# Patient Record
Sex: Female | Born: 1950 | Race: White | Hispanic: No | Marital: Married | Smoking: Current every day smoker
Health system: Southern US, Community
[De-identification: ages and names within clinical notes are randomized; demographics above are authoritative.]

## PROBLEM LIST (undated history)

## (undated) DIAGNOSIS — I4891 Unspecified atrial fibrillation: Secondary | ICD-10-CM

## (undated) DIAGNOSIS — I4892 Unspecified atrial flutter: Secondary | ICD-10-CM

## (undated) DIAGNOSIS — M1990 Unspecified osteoarthritis, unspecified site: Secondary | ICD-10-CM

## (undated) DIAGNOSIS — T4145XA Adverse effect of unspecified anesthetic, initial encounter: Secondary | ICD-10-CM

## (undated) DIAGNOSIS — K219 Gastro-esophageal reflux disease without esophagitis: Secondary | ICD-10-CM

## (undated) DIAGNOSIS — I10 Essential (primary) hypertension: Secondary | ICD-10-CM

## (undated) DIAGNOSIS — M549 Dorsalgia, unspecified: Secondary | ICD-10-CM

## (undated) DIAGNOSIS — T8859XA Other complications of anesthesia, initial encounter: Secondary | ICD-10-CM

## (undated) DIAGNOSIS — R112 Nausea with vomiting, unspecified: Secondary | ICD-10-CM

## (undated) DIAGNOSIS — Z9889 Other specified postprocedural states: Secondary | ICD-10-CM

## (undated) DIAGNOSIS — R0600 Dyspnea, unspecified: Secondary | ICD-10-CM

## (undated) DIAGNOSIS — J449 Chronic obstructive pulmonary disease, unspecified: Secondary | ICD-10-CM

## (undated) DIAGNOSIS — D369 Benign neoplasm, unspecified site: Secondary | ICD-10-CM

## (undated) DIAGNOSIS — E785 Hyperlipidemia, unspecified: Secondary | ICD-10-CM

## (undated) DIAGNOSIS — D509 Iron deficiency anemia, unspecified: Secondary | ICD-10-CM

## (undated) HISTORY — DX: Unspecified osteoarthritis, unspecified site: M19.90

## (undated) HISTORY — DX: Unspecified atrial fibrillation: I48.91

## (undated) HISTORY — DX: Iron deficiency anemia, unspecified: D50.9

## (undated) HISTORY — DX: Hyperlipidemia, unspecified: E78.5

## (undated) HISTORY — DX: Gastro-esophageal reflux disease without esophagitis: K21.9

## (undated) HISTORY — PX: MULTIPLE TOOTH EXTRACTIONS: SHX2053

## (undated) HISTORY — PX: JOINT REPLACEMENT: SHX530

## (undated) HISTORY — DX: Benign neoplasm, unspecified site: D36.9

## (undated) HISTORY — DX: Unspecified atrial flutter: I48.92

## (undated) HISTORY — DX: Dorsalgia, unspecified: M54.9

---

## 2002-03-14 DIAGNOSIS — D369 Benign neoplasm, unspecified site: Secondary | ICD-10-CM

## 2002-03-14 HISTORY — DX: Benign neoplasm, unspecified site: D36.9

## 2003-03-15 LAB — CONVERTED CEMR LAB

## 2004-04-30 ENCOUNTER — Inpatient Hospital Stay (HOSPITAL_COMMUNITY): Admission: RE | Admit: 2004-04-30 | Discharge: 2004-05-04 | Payer: Self-pay | Admitting: Specialist

## 2005-05-12 HISTORY — PX: OTHER SURGICAL HISTORY: SHX169

## 2005-05-27 ENCOUNTER — Inpatient Hospital Stay (HOSPITAL_COMMUNITY): Admission: RE | Admit: 2005-05-27 | Discharge: 2005-06-01 | Payer: Self-pay | Admitting: Specialist

## 2005-06-13 ENCOUNTER — Ambulatory Visit (HOSPITAL_COMMUNITY): Admission: RE | Admit: 2005-06-13 | Discharge: 2005-06-13 | Payer: Self-pay | Admitting: Specialist

## 2005-06-13 ENCOUNTER — Encounter: Payer: Self-pay | Admitting: Vascular Surgery

## 2005-07-11 ENCOUNTER — Encounter: Admission: RE | Admit: 2005-07-11 | Discharge: 2005-07-11 | Payer: Self-pay | Admitting: Specialist

## 2006-05-19 ENCOUNTER — Other Ambulatory Visit: Admission: RE | Admit: 2006-05-19 | Discharge: 2006-05-19 | Payer: Self-pay | Admitting: Family Medicine

## 2006-05-19 ENCOUNTER — Ambulatory Visit: Payer: Self-pay | Admitting: Family Medicine

## 2006-05-19 ENCOUNTER — Encounter: Payer: Self-pay | Admitting: Family Medicine

## 2006-05-19 DIAGNOSIS — R1011 Right upper quadrant pain: Secondary | ICD-10-CM | POA: Insufficient documentation

## 2006-05-19 LAB — CONVERTED CEMR LAB: Pap Smear: NORMAL

## 2006-05-22 ENCOUNTER — Encounter: Payer: Self-pay | Admitting: Family Medicine

## 2006-05-23 ENCOUNTER — Encounter: Payer: Self-pay | Admitting: Family Medicine

## 2006-05-23 DIAGNOSIS — E785 Hyperlipidemia, unspecified: Secondary | ICD-10-CM | POA: Insufficient documentation

## 2006-05-23 LAB — CONVERTED CEMR LAB
AST: 24 units/L (ref 0–37)
Alkaline Phosphatase: 80 units/L (ref 39–117)
Glucose, Bld: 106 mg/dL — ABNORMAL HIGH (ref 70–99)
LDL Cholesterol: 194 mg/dL — ABNORMAL HIGH (ref 0–99)
Sodium: 141 meq/L (ref 135–145)
Total Bilirubin: 0.4 mg/dL (ref 0.3–1.2)
Total Protein: 7 g/dL (ref 6.0–8.3)
Triglycerides: 237 mg/dL — ABNORMAL HIGH (ref <150)
VLDL: 47 mg/dL — ABNORMAL HIGH (ref 0–40)

## 2006-05-24 ENCOUNTER — Encounter: Payer: Self-pay | Admitting: Family Medicine

## 2006-05-25 ENCOUNTER — Telehealth (INDEPENDENT_AMBULATORY_CARE_PROVIDER_SITE_OTHER): Payer: Self-pay | Admitting: *Deleted

## 2006-06-28 ENCOUNTER — Ambulatory Visit: Payer: Self-pay | Admitting: Family Medicine

## 2006-06-28 ENCOUNTER — Encounter: Payer: Self-pay | Admitting: Family Medicine

## 2006-07-10 ENCOUNTER — Encounter: Payer: Self-pay | Admitting: Family Medicine

## 2006-07-25 ENCOUNTER — Ambulatory Visit: Payer: Self-pay | Admitting: Gastroenterology

## 2006-07-27 ENCOUNTER — Telehealth: Payer: Self-pay | Admitting: Family Medicine

## 2006-07-31 ENCOUNTER — Ambulatory Visit: Payer: Self-pay | Admitting: Gastroenterology

## 2006-07-31 DIAGNOSIS — K2100 Gastro-esophageal reflux disease with esophagitis, without bleeding: Secondary | ICD-10-CM | POA: Insufficient documentation

## 2006-08-04 ENCOUNTER — Ambulatory Visit: Payer: Self-pay | Admitting: Cardiovascular Disease

## 2006-08-21 ENCOUNTER — Ambulatory Visit: Payer: Self-pay | Admitting: Gastroenterology

## 2006-08-25 ENCOUNTER — Encounter: Payer: Self-pay | Admitting: Family Medicine

## 2006-08-25 DIAGNOSIS — K219 Gastro-esophageal reflux disease without esophagitis: Secondary | ICD-10-CM | POA: Insufficient documentation

## 2006-10-25 ENCOUNTER — Ambulatory Visit: Payer: Self-pay | Admitting: Gastroenterology

## 2006-10-25 ENCOUNTER — Encounter: Payer: Self-pay | Admitting: Family Medicine

## 2006-12-29 ENCOUNTER — Encounter: Admission: RE | Admit: 2006-12-29 | Discharge: 2006-12-29 | Payer: Self-pay | Admitting: Family Medicine

## 2007-04-03 ENCOUNTER — Ambulatory Visit: Payer: Self-pay | Admitting: Gastroenterology

## 2007-04-03 ENCOUNTER — Encounter: Payer: Self-pay | Admitting: Family Medicine

## 2007-04-05 ENCOUNTER — Encounter: Admission: RE | Admit: 2007-04-05 | Discharge: 2007-04-05 | Payer: Self-pay | Admitting: Gastroenterology

## 2007-04-27 ENCOUNTER — Ambulatory Visit: Payer: Self-pay | Admitting: Gastroenterology

## 2007-04-27 ENCOUNTER — Encounter: Payer: Self-pay | Admitting: Family Medicine

## 2007-04-27 ENCOUNTER — Encounter: Payer: Self-pay | Admitting: Gastroenterology

## 2007-04-27 DIAGNOSIS — D126 Benign neoplasm of colon, unspecified: Secondary | ICD-10-CM | POA: Insufficient documentation

## 2007-04-27 DIAGNOSIS — K648 Other hemorrhoids: Secondary | ICD-10-CM | POA: Insufficient documentation

## 2007-05-01 ENCOUNTER — Ambulatory Visit: Payer: Self-pay | Admitting: Family Medicine

## 2007-05-08 HISTORY — PX: CHOLECYSTECTOMY: SHX55

## 2007-09-05 ENCOUNTER — Ambulatory Visit: Payer: Self-pay | Admitting: Family Medicine

## 2007-09-06 ENCOUNTER — Encounter: Payer: Self-pay | Admitting: Family Medicine

## 2007-10-08 ENCOUNTER — Ambulatory Visit: Payer: Self-pay | Admitting: Family Medicine

## 2007-10-09 LAB — CONVERTED CEMR LAB
ALT: 36 units/L — ABNORMAL HIGH (ref 0–35)
Albumin: 4.7 g/dL (ref 3.5–5.2)
Basophils Absolute: 0 10*3/uL (ref 0.0–0.1)
CO2: 20 meq/L (ref 19–32)
Calcium: 9.7 mg/dL (ref 8.4–10.5)
Chloride: 106 meq/L (ref 96–112)
Hemoglobin: 16 g/dL — ABNORMAL HIGH (ref 12.0–15.0)
Lymphocytes Relative: 27 % (ref 12–46)
Monocytes Absolute: 0.6 10*3/uL (ref 0.1–1.0)
Neutro Abs: 5.6 10*3/uL (ref 1.7–7.7)
Platelets: 218 10*3/uL (ref 150–400)
RDW: 13.3 % (ref 11.5–15.5)
Sodium: 141 meq/L (ref 135–145)
Total Protein: 6.9 g/dL (ref 6.0–8.3)
WBC: 8.7 10*3/uL (ref 4.0–10.5)

## 2007-12-31 ENCOUNTER — Encounter: Payer: Self-pay | Admitting: Family Medicine

## 2008-01-02 ENCOUNTER — Ambulatory Visit: Payer: Self-pay | Admitting: Family Medicine

## 2008-01-02 ENCOUNTER — Other Ambulatory Visit: Admission: RE | Admit: 2008-01-02 | Discharge: 2008-01-02 | Payer: Self-pay | Admitting: Family Medicine

## 2008-01-02 ENCOUNTER — Encounter: Payer: Self-pay | Admitting: Family Medicine

## 2008-01-03 ENCOUNTER — Telehealth (INDEPENDENT_AMBULATORY_CARE_PROVIDER_SITE_OTHER): Payer: Self-pay | Admitting: *Deleted

## 2008-01-03 DIAGNOSIS — N281 Cyst of kidney, acquired: Secondary | ICD-10-CM | POA: Insufficient documentation

## 2008-01-03 LAB — CONVERTED CEMR LAB
ALT: 46 units/L — ABNORMAL HIGH (ref 0–35)
Alkaline Phosphatase: 75 units/L (ref 39–117)
LDL Cholesterol: 193 mg/dL — ABNORMAL HIGH (ref 0–99)
Sodium: 141 meq/L (ref 135–145)
Total Bilirubin: 1 mg/dL (ref 0.3–1.2)
Total Protein: 6.8 g/dL (ref 6.0–8.3)
Triglycerides: 150 mg/dL — ABNORMAL HIGH (ref <150)
VLDL: 30 mg/dL (ref 0–40)

## 2008-01-03 LAB — HM PAP SMEAR

## 2008-01-07 ENCOUNTER — Ambulatory Visit: Payer: Self-pay | Admitting: Family Medicine

## 2008-01-09 ENCOUNTER — Encounter: Admission: RE | Admit: 2008-01-09 | Discharge: 2008-01-09 | Payer: Self-pay | Admitting: Family Medicine

## 2008-01-14 ENCOUNTER — Telehealth: Payer: Self-pay | Admitting: Family Medicine

## 2008-09-01 ENCOUNTER — Ambulatory Visit: Payer: Self-pay | Admitting: Family Medicine

## 2008-09-01 DIAGNOSIS — M545 Low back pain, unspecified: Secondary | ICD-10-CM | POA: Insufficient documentation

## 2008-11-06 ENCOUNTER — Ambulatory Visit: Payer: Self-pay | Admitting: Family Medicine

## 2008-11-06 LAB — CONVERTED CEMR LAB
Blood in Urine, dipstick: NEGATIVE
Glucose, Urine, Semiquant: NEGATIVE
Nitrite: NEGATIVE
Urobilinogen, UA: 0.2

## 2008-11-07 ENCOUNTER — Encounter: Payer: Self-pay | Admitting: Family Medicine

## 2009-01-08 ENCOUNTER — Telehealth: Payer: Self-pay | Admitting: Family Medicine

## 2009-01-13 ENCOUNTER — Encounter: Payer: Self-pay | Admitting: Family Medicine

## 2009-04-16 ENCOUNTER — Ambulatory Visit: Payer: Self-pay | Admitting: Family Medicine

## 2009-04-16 ENCOUNTER — Telehealth: Payer: Self-pay | Admitting: Family Medicine

## 2009-04-27 ENCOUNTER — Encounter: Admission: RE | Admit: 2009-04-27 | Discharge: 2009-04-27 | Payer: Self-pay | Admitting: Family Medicine

## 2009-04-27 ENCOUNTER — Encounter: Payer: Self-pay | Admitting: Family Medicine

## 2009-04-30 LAB — CONVERTED CEMR LAB
AST: 14 units/L (ref 0–37)
Albumin: 4.5 g/dL (ref 3.5–5.2)
Alkaline Phosphatase: 74 units/L (ref 39–117)
BUN: 13 mg/dL (ref 6–23)
HDL: 50 mg/dL (ref >39)
LDL Cholesterol: 127 mg/dL — ABNORMAL HIGH (ref 0–99)
Potassium: 4.8 meq/L (ref 3.5–5.3)
Total Bilirubin: 0.5 mg/dL (ref 0.3–1.2)
Total CHOL/HDL Ratio: 3.8
VLDL: 14 mg/dL (ref 0–40)

## 2009-05-10 ENCOUNTER — Encounter: Admission: RE | Admit: 2009-05-10 | Discharge: 2009-05-10 | Payer: Self-pay | Admitting: Family Medicine

## 2009-06-02 ENCOUNTER — Telehealth: Payer: Self-pay | Admitting: Family Medicine

## 2009-10-23 ENCOUNTER — Ambulatory Visit: Payer: Self-pay | Admitting: Family Medicine

## 2009-10-23 DIAGNOSIS — IMO0002 Reserved for concepts with insufficient information to code with codable children: Secondary | ICD-10-CM | POA: Insufficient documentation

## 2010-03-02 ENCOUNTER — Encounter: Payer: Self-pay | Admitting: Family Medicine

## 2010-03-03 ENCOUNTER — Encounter: Payer: Self-pay | Admitting: Family Medicine

## 2010-04-04 ENCOUNTER — Encounter: Payer: Self-pay | Admitting: Specialist

## 2010-04-13 NOTE — Assessment & Plan Note (Signed)
Summary: Trap strain, LIpids   Vital Signs:  Patient profile:   60 year old female Height:      64 inches Weight:      252 pounds BMI:     43.41 O2 Sat:      96 % on Room air Temp:     98.3 degrees F oral Pulse rate:   88 / minute BP sitting:   133 / 83  (right arm)  Vitals Entered By: Selena Batten Johnson/April (April 16, 2009 2:53 PM)  O2 Flow:  Room air CC: c/o right shoulder pain x 2 days, last night pain 8/10, took aleve no relief   Primary Care Provider:  Linford Arnold, C  CC:  c/o right shoulder pain x 2 days, last night pain 8/10, and took aleve no relief.  History of Present Illness: c/o right shoulder pain x 2 days, last night pain 8/10, took aleve no relief.  No trauma or injury.  Righ scap pain betweent eh shoulder blade and the spine. Feels like something  is "pinched".  Taking ALve and no relief.  Tried heat - eases  alittle. Normal shoulder ROM.  No worsening positions or sxs.  Cough makes it worse.    Current Medications (verified): 1)  Multivitamins  Caps (Multiple Vitamin) .... Take One Tablet Once A Day 2)  Coral Calcium 500 Mg Caps (Coral Calcium) .... Take One Capsule By Mouth Once A Day 3)  Acidophilus  Caps (Lactobacillus) 4)  Simvastatin 40 Mg Tabs (Simvastatin) .... Take 1 Tablet By Mouth Once A Day At Bedtime 5)  Bactrim Ds 800-160 Mg Tabs (Sulfamethoxazole-Trimethoprim) .... Take One (1) By Mouth Twice A Day For 3 Days  Allergies (verified): No Known Drug Allergies  Social History: Reviewed history from 05/19/2006 and no changes required. 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   Current Smoker Alcohol use-yes Drug use-no Regular exercise-no  Physical Exam  General:  Well-developed,well-nourished,in no acute distress; alert,appropriate and cooperative throughout examination Msk:  No deformity or scoliosis noted of thoracic or lumbar spine.  Neck and shoulder with NROM.  Pain with rotation of the neck to the  right.  non-tender cervical and upper thoracid spine.  Tender betweent eh scapular edge and the spine. Nontender scapula and shoulder.  Shoudler strnethg 5/5    Impression & Recommendations:  Problem # 1:  MUSCLE SPASM, TRAPEZIUS (ICD-728.85) Continue NSAID use. Add muscle relaxer at betime. Given H.O on exercises to stretch out the area (upper backi). If not improving in 1-2 weeks then please come back for furthe eval and possible trigger point injection.   Problem # 2:  HYPERLIPIDEMIA (ICD-272.4) Due for refill and recheck on labs. Will check liver function as well.  Her updated medication list for this problem includes:    Simvastatin 40 Mg Tabs (Simvastatin) .Marland Kitchen... Take 1 tablet by mouth once a day at bedtime  Orders: T-Lipid Profile ([id number]) T-Comprehensive Metabolic Panel ([id number])  Complete Medication List: 1)  Multivitamins Caps (Multiple vitamin) .... Take one tablet once a day 2)  Coral Calcium 500 Mg Caps (Coral calcium) .... Take one capsule by mouth once a day 3)  Acidophilus Caps (Lactobacillus) 4)  Simvastatin 40 Mg Tabs (Simvastatin) .... Take 1 tablet by mouth once a day at bedtime 5)  Flexeril 10 Mg Tabs (Cyclobenzaprine hcl) .... Up to 3 x a day as needed for muscle spasm. 6)  Piroxicam 10 Mg Caps (Piroxicam) .... Take 1 tablet  by mouth two times a day as needed for arthritis Prescriptions: PIROXICAM 10 MG CAPS (PIROXICAM) Take 1 tablet by mouth two times a day as needed for arthritis  #60 x 2   Entered and Authorized by:   Nani Gasser MD   Signed by:   Nani Gasser MD on 04/16/2009   Method used:   [facility]        [facility] [phone]* (retail)       [address] [place], [place]  [address]       Ph: [phone] or [phone]       Fax: [phone]   RxID:   [id number] SIMVASTATIN 40 MG TABS (SIMVASTATIN) Take 1 tablet by mouth once a day at bedtime  #90 x 3   Entered and Authorized by:   Nani Gasser MD    Signed by:   Nani Gasser MD on 04/16/2009   Method used:   [facility] to        [facility] [phone]* (retail)       [address] [place], [place]  [address]       Ph: [phone] or [phone]       Fax: [phone]   RxID:   [id number] FLEXERIL 10 MG TABS (CYCLOBENZAPRINE HCL) up to 3 x a day as needed for muscle spasm.  #20 x 0   Entered and Authorized by:   Nani Gasser MD   Signed by:   Nani Gasser MD on 04/16/2009   Method used:   [facility]ronically to        [facility] [phone]* (retail)       [address] [place], [place]  [address]       Ph: [phone] or [phone]       Fax: [phone]   RxID:   [id number]

## 2010-04-13 NOTE — Assessment & Plan Note (Signed)
Summary: BACK PAIN, acute   Vital Signs:  Patient profile:   60 year old female Height:      64 inches Weight:      257 pounds Pulse rate:   66 / minute BP sitting:   156 / 84  (left arm) Cuff size:   regular  Vitals Entered By: Avon Gully CMA, Duncan Dull) (October 23, 2009 10:02 AM) CC: back pain since 3am  Pain Assessment Patient in pain? yes     Location: back Intensity: 8 Type: sharp   Primary Care Jamespaul Secrist:  Linford Arnold, C  CC:  back pain since 3am .  History of Present Illness: Last Wednsday felt something puss as she ws going to the bathroom. then about 3AM got out of back andfelt her back "catch" on her right mid back.  Only taking ASA for pain relief. No radiation of pain.  Pain is 8/10 today.  Better if leans forward. Worse when striaghtens up. No prior injuries.   Current Medications (verified): 1)  Multivitamins  Caps (Multiple Vitamin) .... Take One Tablet Once A Day 2)  Coral Calcium 500 Mg Caps (Coral Calcium) .... Take One Capsule By Mouth Once A Day 3)  Simvastatin 80 Mg Tabs (Simvastatin) .... Take 1 Tablet By Mouth Once A Day At Bedtime 4)  Piroxicam 10 Mg Caps (Piroxicam) .... Take 1 Tablet By Mouth Two Times A Day As Needed For Arthritis 5)  Fish Oil 1000 Mg Caps (Omega-3 Fatty Acids) .... One Tablet By Mouth Once A Day 6)  Vitamin D3 2000 Unit Tabs (Cholecalciferol) .... One Tablet By Mouth Once A Day  Allergies (verified): No Known Drug Allergies  Comments:  Nurse/Medical Assistant: The patient's medications and allergies were reviewed with the patient and were updated in the Medication and Allergy Lists. Avon Gully CMA, Duncan Dull) (October 23, 2009 10:05 AM)  Physical Exam  General:  Well-developed,well-nourished,in no acute distress; alert,appropriate and cooperative throughout examination Head:  Normocephalic and atraumatic without obvious abnormalities. No apparent alopecia or balding. Msk:  Normal flexion, dec extension to about 10 degress.  Normal rotation right and left but very painful.  Tender over the lower thoraic right paraspinous muscles. Neg straight leg raise. Hip, knee, ankle sterngh 5/5. Nontender over the thoracid orlumbar spine.    Impression & Recommendations:  Problem # 1:  BACK STRAIN, ACUTE (ICD-847.9)  Start Aleve two times a day with food Start flexeril 10mg  at bedtime as needed for muscle spasm.  Can use tramadol for extra relief Can start exercises tomorrow If not better in 2 weeks then follow up.  Toradol given for acute pain relief.   Orders: Ketorolac-Toradol 15mg  (Z6109) Admin of Therapeutic Inj  intramuscular or subcutaneous (60[address])  Complete Medication List: 1)  Multivitamins Caps (Multiple vitamin) .... Take one tablet once a day 2)  Coral Calcium 500 Mg Caps (Coral calcium) .... Take one capsule by mouth once a day 3)  Simvastatin 80 Mg Tabs (Simvastatin) .... Take 1 tablet by mouth once a day at bedtime 4)  Piroxicam 10 Mg Caps (Piroxicam) .... Take 1 tablet by mouth two times a day as needed for arthritis 5)  Fish Oil 1000 Mg Caps (Omega-3 fatty acids) .... One tablet by mouth once a day 6)  Vitamin D3 2000 Unit Tabs (Cholecalciferol) .... One tablet by mouth once a day 7)  Flexeril 10 Mg Tabs (Cyclobenzaprine hcl) .... Take 1 tablet by mouth two times a day as needed 8)  Tramadol Hcl 50 Mg Tabs (Tramadol  hcl) .... Take 1 tablet by mouth three times a day as needed pain  Patient Instructions: 1)  Start Aleve two times a day with food 2)  Start flexeril 10mg  at bedtime as needed for muscle spasm.  3)  Can use tramadol for extra relief 4)  Can start exercises tomorrow 5)  If not better in 2 weeks then follow up.  Prescriptions: TRAMADOL HCL 50 MG TABS (TRAMADOL HCL) Take 1 tablet by mouth three times a day as needed pain  #45 x 0   Entered and Authorized by:   Nani Gasser MD   Signed by:   Nani Gasser MD on 10/23/2009   Method used:   [facility]ronically to        [facility] [phone]* (retail)       [address] [place], [place]  [address]       Ph: [phone] or [phone]       Fax: [phone]   RxID:   [id number] FLEXERIL 10 MG TABS (CYCLOBENZAPRINE HCL) Take 1 tablet by mouth two times a day as needed  #40 x 0   Entered and Authorized by:   Nani Gasser MD   Signed by:   Nani Gasser MD on 10/23/2009   Method used:   [facility]ronically to        [facility] [phone]* (retail)       [address] [place], [place]  [address]       Ph: [phone] or [phone]       Fax: [phone]   RxID:   [id number]    Medication Administration  Injection # 1:    Medication: Ketorolac-Toradol 15mg     Diagnosis: BACK STRAIN, ACUTE (ICD-847.9)    Route: IM    Site: RUOQ gluteus    Exp Date: 02/12/2011    Lot #: [id number]    Mfr: hospira    Comments: toradol 60mg /ml given    Patient tolerated injection without complications    Given by: Avon Gully CMA, Duncan Dull) (October 23, 2009 10:19 AM)  Orders Added: 1)  Est. Patient Level IV [99214] 2)  Ketorolac-Toradol 15mg  [J1885] 3)  Admin of Therapeutic Inj  intramuscular or subcutaneous [02774]

## 2010-04-13 NOTE — Progress Notes (Signed)
Summary: WANTS REFERRAL TO [facility]  Phone Note Call from Patient   Caller: Patient Summary of Call: Dr.Azaiah Licciardi  Patient nned a Referral to go to [facility] Initial call taken by: Vanessa Swaziland,  April 16, 2009 3:31 PM  Follow-up for Phone Call        [facility] for Pt to [facility] w/ more info re request. 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 would like a [facility] Additional Follow-up by: Kandice Hams,  April 21, 2009 4:42 PM    Additional Follow-up for Phone Call Additional follow up Details #2::    Was already ordered in October. Will reprint and fax downstairs.   Follow-up by: Nani Gasser MD,  April 21, 2009 5:05 PM

## 2010-04-13 NOTE — Progress Notes (Signed)
Summary: needs rx  Phone Note Call from Patient Call back at Home Phone [phone]   Caller: Patient Call For: Nani Gasser MD Summary of Call: Pt calls and needs new Rx for the cholesterol med that you increased the dose on sent to her pharmacy. Increased to 80mg .   Initial call taken by: Kathlene November,  June 02, 2009 11:29 AM    New/Updated Medications: SIMVASTATIN 80 MG TABS (SIMVASTATIN) Take 1 tablet by mouth once a day at bedtime Prescriptions: SIMVASTATIN 80 MG TABS (SIMVASTATIN) Take 1 tablet by mouth once a day at bedtime  #30 x 3   Entered and Authorized by:   Nani Gasser MD   Signed by:   Nani Gasser MD on 06/02/2009   Method used:   Electronically to        [facility] [phone]* (retail)       [address] [place], [place]  [address]       Ph: [phone]       Fax: [phone]   RxID:   [id number]

## 2010-04-13 NOTE — Letter (Signed)
Summary: Out of Work  [facility]  [address]   [place], [place] [address]   Phone: [phone]  Fax: [phone]    October 23, 2009   Employee:  Steele Sizer    To Whom It May Concern:   For Medical reasons, please excuse the above named employee from work for the following dates:  Start:   10-23-2009  End:   10-26-2009  If you need additional information, please feel free to contact our office.         Sincerely,    Nani Gasser MD

## 2010-05-11 ENCOUNTER — Encounter: Payer: Self-pay | Admitting: Family Medicine

## 2010-05-11 ENCOUNTER — Other Ambulatory Visit: Payer: Self-pay | Admitting: Family Medicine

## 2010-05-11 ENCOUNTER — Encounter (INDEPENDENT_AMBULATORY_CARE_PROVIDER_SITE_OTHER): Payer: Managed Care, Other (non HMO) | Admitting: Family Medicine

## 2010-05-11 DIAGNOSIS — R351 Nocturia: Secondary | ICD-10-CM

## 2010-05-11 DIAGNOSIS — Z01419 Encounter for gynecological examination (general) (routine) without abnormal findings: Secondary | ICD-10-CM

## 2010-05-11 DIAGNOSIS — F39 Unspecified mood [affective] disorder: Secondary | ICD-10-CM | POA: Insufficient documentation

## 2010-05-11 DIAGNOSIS — R0989 Other specified symptoms and signs involving the circulatory and respiratory systems: Secondary | ICD-10-CM | POA: Insufficient documentation

## 2010-05-11 DIAGNOSIS — I10 Essential (primary) hypertension: Secondary | ICD-10-CM

## 2010-05-11 DIAGNOSIS — R0609 Other forms of dyspnea: Secondary | ICD-10-CM

## 2010-05-11 LAB — CONVERTED CEMR LAB
Cholesterol, target level: 200 mg/dL
HDL goal, serum: 40 mg/dL
LDL Goal: 130 mg/dL

## 2010-05-13 ENCOUNTER — Encounter: Payer: Self-pay | Admitting: Family Medicine

## 2010-05-14 ENCOUNTER — Encounter (INDEPENDENT_AMBULATORY_CARE_PROVIDER_SITE_OTHER): Payer: Self-pay | Admitting: *Deleted

## 2010-05-17 ENCOUNTER — Encounter: Payer: Self-pay | Admitting: Family Medicine

## 2010-05-18 LAB — CONVERTED CEMR LAB
ALT: 29 units/L (ref 0–35)
AST: 19 units/L (ref 0–37)
Alkaline Phosphatase: 77 units/L (ref 39–117)
BUN: 12 mg/dL (ref 6–23)
Blood, UA: NEGATIVE
Calcium: 9 mg/dL (ref 8.4–10.5)
Chloride: 105 meq/L (ref 96–112)
Creatinine, Ser: 0.65 mg/dL (ref 0.40–1.20)
HDL: 51 mg/dL (ref >39)
LDL Cholesterol: 149 mg/dL — ABNORMAL HIGH (ref 0–99)
Leukocytes, UA: NEGATIVE
Nitrite: NEGATIVE
Potassium: 4.4 meq/L (ref 3.5–5.3)
Protein, ur: NEGATIVE mg/dL
Squamous Epithelial / LPF: NONE SEEN /lpf
Total CHOL/HDL Ratio: 4.3
Urine Glucose: NEGATIVE mg/dL
Urobilinogen, UA: 0.2 (ref 0.0–1.0)

## 2010-05-20 ENCOUNTER — Encounter: Payer: Self-pay | Admitting: Family Medicine

## 2010-05-20 ENCOUNTER — Other Ambulatory Visit: Payer: Self-pay | Admitting: Family Medicine

## 2010-05-20 ENCOUNTER — Inpatient Hospital Stay (HOSPITAL_COMMUNITY): Admit: 2010-05-20 | Payer: Self-pay

## 2010-05-20 ENCOUNTER — Ambulatory Visit (INDEPENDENT_AMBULATORY_CARE_PROVIDER_SITE_OTHER): Payer: Managed Care, Other (non HMO) | Admitting: Family Medicine

## 2010-05-20 DIAGNOSIS — Z124 Encounter for screening for malignant neoplasm of cervix: Secondary | ICD-10-CM

## 2010-05-20 NOTE — Assessment & Plan Note (Signed)
Summary: CPE w/ pap   Vital Signs:  Patient profile:   60 year old female Height:      64 inches Weight:      260 pounds Pulse rate:   92 / minute BP sitting:   145 / 82  (right arm) Cuff size:   regular  Vitals Entered By: Avon Gully CMA, Duncan Dull) (May 11, 2010 1:53 PM) CC: CPE and pap, Lipid Management   Primary Care Provider:  Linford Arnold, C  CC:  CPE and pap and Lipid Management.  History of Present Illness: Feels down since OCtober  ( 5 monts). No stressors.  Sleeping OK. Says she doesn feel sad but then just feels like she is not happy either. Job is going OK.   Stil havig occ RUQ pain.  as it will feel like a catch like something is twisting in her gut.  Sometimes she is able to move around and it will ease off slightly.  He can last for several minutes at a time.  It is not associated with eating.  Has noturia for over a year.  It really bothers her sleep.  Says urinating about 5 times a night.  No blood in the urine. Snores nightly. she denies any dysuria or urgency.  She denies any history of frequent urinary tract infections.No worsening or alleviating symptoms.  She is even tried restricting her fluid intake at night and this has not made a difference.  She says her urination is normal she is not trickling or have a low flow or force of stream.  Occ AM HA. Snores niglty. No SOB. She is obese. Father wiht hx of OSA.  Mood change. she knows she is felt unhappy over the winter.  She said normally she was as she has just felt blah.     Lipid Management History:      Positive NCEP/ATP III risk factors include female age 63 years old or older, current tobacco user, and hypertension.    Current Medications (verified): 1)  Multivitamins  Caps (Multiple Vitamin) .... Take One Tablet Once A Day 2)  Coral Calcium 500 Mg Caps (Coral Calcium) .... Take One Capsule By Mouth Once A Day 3)  Simvastatin 80 Mg Tabs (Simvastatin) .... Take 1 Tablet By Mouth Once A Day At  Bedtime 4)  Piroxicam 10 Mg Caps (Piroxicam) .... Take 1 Tablet By Mouth Two Times A Day As Needed For Arthritis 5)  Fish Oil 1000 Mg Caps (Omega-3 Fatty Acids) .... One Tablet By Mouth Once A Day 6)  Vitamin D3 2000 Unit Tabs (Cholecalciferol) .... One Tablet By Mouth Once A Day  Allergies (verified): No Known Drug Allergies  Comments:  Nurse/Medical Assistant: The patient's medications and allergies were reviewed with the patient and were updated in the Medication and Allergy Lists. Avon Gully CMA, Duncan Dull) (May 11, 2010 1:54 PM)  Past History:  Past Medical History: Last updated: 08/25/2006 Prior hx BP before knee surgery.  Also told high cholesterol but it has resolved.   EGD performed on 07/31/06 -Dx with esophagitis, adn reflux.   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 Mother with depression, hi cholesterol, HTN, Lung Ca Father w/ Hi chol, HTN  Social History: Reviewed history from 05/19/2006 and no changes required. 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   Current Smoker Alcohol use-yes Drug use-no  Regular exercise-no  Review of Systems  The patient denies anorexia, fever, weight loss, weight gain, vision loss, decreased hearing, hoarseness, chest pain, syncope, dyspnea on exertion, peripheral edema, prolonged cough, headaches, hemoptysis, abdominal pain, melena, hematochezia, severe indigestion/heartburn, hematuria, incontinence, genital sores, muscle weakness, suspicious skin lesions, transient blindness, difficulty walking, depression, unusual weight change, abnormal bleeding, enlarged lymph nodes, and breast masses.    Physical Exam  General:  Well-developed,well-nourished,in no acute distress; alert,appropriate and cooperative throughout examination Head:   Normocephalic and atraumatic without obvious abnormalities. No apparent alopecia or balding. Eyes:  No corneal or conjunctival inflammation noted. EOMI. Perrla.  Ears:  External ear exam shows no significant lesions or deformities.  Otoscopic examination reveals clear canals, tympanic membranes are intact bilaterally without bulging, retraction, inflammation or discharge. Hearing is grossly normal bilaterally. Nose:  External nasal examination shows no deformity or inflammation.  Mouth:  Oral mucosa and oropharynx without lesions or exudates.  Teeth in good repair. Neck:  No deformities, masses, or tenderness noted. Chest Wall:  No deformities, masses, or tenderness noted. Breasts:  No mass, nodules, thickening, tenderness, bulging, retraction, inflamation, nipple discharge or skin changes noted.   Lungs:  Normal respiratory effort, chest expands symmetrically. Lungs are clear to auscultation, no crackles or wheezes. Heart:  Normal rate and regular rhythm. S1 and S2 normal without gallop, murmur, click, rub or other extra sounds. Abdomen:  Bowel sounds positive,abdomen soft and non-tender without masses, organomegaly or hernias noted. Genitalia:  Normal introitus for age, no external lesions, no vaginal discharge, mucosa pink and moist, no vaginal or cervical lesions, no vaginal atrophy, no friaility or hemorrhage, normal uterus size and position, no adnexal masses or tenderness Msk:  No deformity or scoliosis noted of thoracic or lumbar spine.   Pulses:  R and L carotid,radiall, full and equal bilaterally Extremities:  No clubbing, cyanosis, edema, or deformity noted with normal full range of motion of all joints.   Neurologic:  No cranial nerve deficits noted. Station and gait are normal.  Sensory, motor and coordinative functions appear intact. Skin:  no rashes.   Cervical Nodes:  No lymphadenopathy noted Psych:  Cognition and judgment appear intact. Alert and cooperative with normal attention  span and concentration. No apparent delusions, illusions, hallucinations   Impression & Recommendations:  Problem # 1:  GYNECOLOGICAL EXAM; PELVIC EXAM (ICD-V72.31) Due for screening labs Declined flu vac.   Other vaccines ar up to date Due for screening labs.  Due for colonoscopy . Has been over 10 years.  Orders: T-Comprehensive Metabolic Panel [id number]) T-Lipid Profile [id number]) T-Urinalysis ([id number]) T-Urine Microscopic ([id number])  Problem # 2:  HYPERTENSION, BENIGN (ICD-401.1) Assessment: New Discussednew dx. Will star med. Discussed potential SE. call if any concerns. Recheck in 1 mo.  Her updated medication list for this problem includes:    Lisinopril 10 Mg Tabs (Lisinopril) .Marland Kitchen... Take 1 tablet by mouth once a day  Problem # 3:  SNORING (ICD-786.09)  Occ AM HA. Snores niglty. No SOB. She is obese. Father wiht hx of OSA.  Mood change. I really think she should be tested but she is very hesistant. I asked her to really think about this.   Her updated medication list for this problem includes:    Lisinopril 10 Mg Tabs (Lisinopril) .Marland Kitchen... Take 1 tablet by mouth once a day  Problem # 4:  MOOD DISORDER (ICD-296.90) PHQ- 9 score of 8. discussed different options.  She is not interested in medication at this time.  Certainly let her  know she could see how she feels with the dates are getting longer and the weather is getting warmer.  If not improving in the next month and do recommend we consider further intervention with either counseling or medication.  Problem # 5:  NOCTURIA (BJY-782.95) okay urinalysis and micro-on her period if this is normal then I recommend a trial of a bladder medication. Orders: T-Urinalysis ([id number]) T-Urine Microscopic ([id number])  Complete Medication List: 1)  Multivitamins Caps (Multiple vitamin) .... Take one tablet once a day 2)  Coral Calcium 500 Mg Caps (Coral calcium) .... Take one capsule by mouth once a day 3)   Simvastatin 80 Mg Tabs (Simvastatin) .... Take 1 tablet by mouth once a day at bedtime 4)  Piroxicam 10 Mg Caps (Piroxicam) .... Take 1 tablet by mouth two times a day as needed for arthritis 5)  Fish Oil 1000 Mg Caps (Omega-3 fatty acids) .... One tablet by mouth once a day 6)  Vitamin D3 2000 Unit Tabs (Cholecalciferol) .... One tablet by mouth once a day 7)  Lisinopril 10 Mg Tabs (Lisinopril) .... Take 1 tablet by mouth once a day  Other Orders: Gastroenterology Referral (GI)  Lipid Assessment/Plan:      Based on NCEP/ATP III, the patient's risk factor category is "0-1 risk factors".  The patient's lipid goals are as follows: Total cholesterol goal is 200; LDL cholesterol goal is 130; HDL cholesterol goal is 40; Triglyceride goal is 150.    Patient Instructions: 1)  WE will call you with the referal to GI 2)  We will call you with your labs and urine test resuls.   3)  Please consider getting tested for sleep apnea. 4)  It is important that you exercise reguarly at least 20 minutes 5 times a week. If you develop chest pain, have severe difficulty breathing, or feel very tired, stop exercising immediately and seek medical attention.  5)  You need to lose weight. Consider a lower calorie diet and regular exercise.  6)  Take calcium +vitamin D daily.  7)  Start BP pill once a day and follow up for recheck in one month.  Call if any concerns or side effects.  Also recommend the DASH diet (google it.  It is a nih/gov website). Low salt and high potassium diet.   Contraindications/Deferment of Procedures/Staging:    Test/Procedure: FLU VAX    Reason for deferment: patient declined   Prescriptions: LISINOPRIL 10 MG TABS (LISINOPRIL) Take 1 tablet by mouth once a day  #30 x 1   Entered and Authorized by:   Nani Gasser MD   Signed by:   Nani Gasser MD on 05/11/2010   Method used:   [facility] to        [facility] [phone]* (retail)       [address] [place], [place]  [address]       Ph: [phone] or [phone]       Fax: [phone]   RxID:   [id number]    Orders Added: 1)  Gastroenterology Referral [GI] 2)  Est. Patient age 58-64 [54] 3)  T-Comprehensive Metabolic Panel [[id number]] 4)  T-Lipid Profile [[id number]] 5)  T-Urinalysis [81003-65000] 6)  T-Urine Microscopic [[id number]] 7)  Est. Patient Level IV [25427]    Flex Sig Next Due:  Not Indicated Hemoccult Next Due:  Not Indicated

## 2010-05-20 NOTE — Letter (Signed)
Summary: Primary Care Consult Scheduled Letter  [facility]  [address]   [place], [place] [address]   Phone: [phone]  Fax: [phone]      05/14/2010 MRN: [id number]  Kelly Roth [address], [place]  [address]  [place]    Dear [name],    We have scheduled an appointment for you.  At the recommendation of Dr.Metheney, we have scheduled you a consult with [facility]- Dr.Stark on 06/02/10 at 2:45.  Their address is [address], [place] [address] (3rd floor). The office phone number is [phone].  If this appointment day and time is not convenient for you, please feel free to call the office of the doctor you are being referred to at the number listed above and reschedule the appointment.     It is important for you to keep your scheduled appointments. We are here to make sure you are given good patient care.    Thank you, Kelly Roth  Patient Care Coordinator [facility] Kathryne Sharper

## 2010-05-25 NOTE — Assessment & Plan Note (Signed)
Summary: Repeat pap   Vital Signs:  Patient profile:   60 year old female Height:      64 inches Weight:      263 pounds Pulse rate:   83 / minute BP sitting:   137 / 79  (right arm) Cuff size:   large  Vitals Entered By: Avon Gully CMA, Duncan Dull) (May 20, 2010 10:37 AM) CC: pap only   Primary Care Provider:  Linford Arnold, C  CC:  pap only.  History of Present Illness: Here for recollection on the pap.  Sample was not adequate on original pap.    Current Medications (verified): 1)  Multivitamins  Caps (Multiple Vitamin) .... Take One Tablet Once A Day 2)  Coral Calcium 500 Mg Caps (Coral Calcium) .... Take One Capsule By Mouth Once A Day 3)  Simvastatin 80 Mg Tabs (Simvastatin) .... Take 1 Tablet By Mouth Once A Day At Bedtime 4)  Piroxicam 10 Mg Caps (Piroxicam) .... Take 1 Tablet By Mouth Two Times A Day As Needed For Arthritis 5)  Fish Oil 1000 Mg Caps (Omega-3 Fatty Acids) .... One Tablet By Mouth Once A Day 6)  Vitamin D3 2000 Unit Tabs (Cholecalciferol) .... One Tablet By Mouth Once A Day 7)  Lisinopril 10 Mg Tabs (Lisinopril) .... Take 1 Tablet By Mouth Once A Day 8)  Lipitor 20 Mg Tabs (Atorvastatin Calcium) .... Take 1 Tablet By Mouth Once A Day At Bedtime  Allergies (verified): No Known Drug Allergies  Comments:  Nurse/Medical Assistant: The patient's medications and allergies were reviewed with the patient and were updated in the Medication and Allergy Lists. Avon Gully CMA, Duncan Dull) (May 20, 2010 10:37 AM)  Physical Exam  Genitalia:  Normal introitus for age, no external lesions, no vaginal discharge, mucosa pink and moist, no vaginal or cervical lesions, no vaginal atrophy, no friaility or hemorrhage, normal uterus size and position, no adnexal masses or tenderness. Cervix is stenotic.    Impression & Recommendations:  Problem # 1:  SCREENING FOR MALIGNANT NEOPLASM OF THE CERVIX (ICD-V76.2) Pap smear recollected and will be sent for  path Orders: No Charge Patient Arrived (NCPA0) (NCPA0)  Complete Medication List: 1)  Multivitamins Caps (Multiple vitamin) .... Take one tablet once a day 2)  Coral Calcium 500 Mg Caps (Coral calcium) .... Take one capsule by mouth once a day 3)  Simvastatin 80 Mg Tabs (Simvastatin) .... Take 1 tablet by mouth once a day at bedtime 4)  Piroxicam 10 Mg Caps (Piroxicam) .... Take 1 tablet by mouth two times a day as needed for arthritis 5)  Fish Oil 1000 Mg Caps (Omega-3 fatty acids) .... One tablet by mouth once a day 6)  Vitamin D3 2000 Unit Tabs (Cholecalciferol) .... One tablet by mouth once a day 7)  Lisinopril 10 Mg Tabs (Lisinopril) .... Take 1 tablet by mouth once a day 8)  Lipitor 20 Mg Tabs (Atorvastatin calcium) .... Take 1 tablet by mouth once a day at bedtime   Orders Added: 1)  No Charge Patient Arrived (NCPA0) [NCPA0]

## 2010-06-01 NOTE — Letter (Signed)
Summary: Patient Health Questionnaire  Patient Health Questionnaire   Imported By: Maryln Gottron 05/25/2010 15:31:02  _____________________________________________________________________  External Attachment:    Type:   Image     Comment:   External Document

## 2010-06-02 ENCOUNTER — Ambulatory Visit (INDEPENDENT_AMBULATORY_CARE_PROVIDER_SITE_OTHER): Payer: Managed Care, Other (non HMO) | Admitting: Gastroenterology

## 2010-06-02 ENCOUNTER — Other Ambulatory Visit: Payer: Self-pay | Admitting: Gastroenterology

## 2010-06-02 ENCOUNTER — Encounter: Payer: Self-pay | Admitting: Gastroenterology

## 2010-06-02 VITALS — BP 132/76 | HR 88 | Ht 64.0 in | Wt 261.0 lb

## 2010-06-02 DIAGNOSIS — K5900 Constipation, unspecified: Secondary | ICD-10-CM | POA: Insufficient documentation

## 2010-06-02 DIAGNOSIS — Z860100 Personal history of colon polyps, unspecified: Secondary | ICD-10-CM | POA: Insufficient documentation

## 2010-06-02 DIAGNOSIS — R1011 Right upper quadrant pain: Secondary | ICD-10-CM

## 2010-06-02 DIAGNOSIS — R933 Abnormal findings on diagnostic imaging of other parts of digestive tract: Secondary | ICD-10-CM

## 2010-06-02 MED ORDER — BENEFIBER PO POWD: ORAL | Status: DC

## 2010-06-02 NOTE — Assessment & Plan Note (Addendum)
Mild constipation which may be contributing to her RUQ pain. High fiber diet and daily Benefiber supplements with adequate water intake. Consider trial of an anti-spasmodic.

## 2010-06-02 NOTE — Assessment & Plan Note (Signed)
Personal history of adenomatous colon polyps. Surveillance colonoscopy recommended February 2014.

## 2010-06-02 NOTE — Assessment & Plan Note (Signed)
Recurrent right upper quadrant pain resolved following cholecystectomy 2009. She has had intermittent right upper quadrant pain for the past 6 months occasionally radiates to her back and improves when she has adequate bowel movements. Pain only lasts for several minutes at a time. Rule out functional abdominal pain related to constipation or adhesions related to prior surgery. Blood work and MRI as planned.

## 2010-06-02 NOTE — Progress Notes (Signed)
History of Present Illness: This is a  60 year old female who has had recurrent episodes of mild right upper quadrant pain for the past 6 months. Previous right upper quadrant pain resolved following her cholecystectomy in 2009. She has brief episodes of right upper quadrant pain radiating to her back that generally last for 10 or 15 minutes at a time and are often associated with mild constipation. She denies nausea vomiting, change in bowel habits, dysphagia, odonophagia, reflux symptoms, melena and hematochezia. She has had multiple abdominal imaging studies over the past several years due to the a small pancreatic body cyst that showed no additional pathology. She underwent upper endoscopy in May 2008 which showed erosive esophagitis and gastritis. Recently she has been off all acid medications and has no typical acid symptoms. Last colonoscopy in February 2009. In addition she complains of external hemorrhoids which are bothersome and she would like to consider surgical management.  Current Medications, Allergies, Past Medical History, Past Surgical History, Family History and Social History were reviewed in [employer] record.  Physical Exam: General: Well developed , well nourished, obese, no acute distress Head: Normocephalic and atraumatic Eyes:  sclerae anicteric, EOMI Ears: Normal auditory acuity Mouth: No deformity or lesions Lungs: Clear throughout to auscultation Heart: Regular rate and rhythm; no murmurs, rubs or bruits Abdomen: Soft, minimal right upper quadrant tenderness to deep palpation without rebound or guarding, and non distended. No masses, hepatosplenomegaly or hernias noted. Normal Bowel sounds Musculoskeletal: Symmetrical with no gross deformities  Pulses:  Normal pulses noted Extremities: No clubbing, cyanosis, edema or deformities noted Neurological: Alert oriented x 4, grossly nonfocal Psychological:  Alert and cooperative. Normal mood and  affect

## 2010-06-02 NOTE — Patient Instructions (Signed)
You have been scheduled for a MRI of the abdomen at [facility]. Separate instruction sheet given.  Please go to the basement to have labs drawn today. Start Benefiber supplement once daily. High fiber diet given to patient.

## 2010-06-02 NOTE — Assessment & Plan Note (Signed)
History of a small pancreatic cyst with several imaging studies showing the cyst has remained stable over the past few years. 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 Schedule MRI with and without contrast to reevaluate for stability

## 2010-06-07 ENCOUNTER — Other Ambulatory Visit: Payer: Self-pay | Admitting: Family Medicine

## 2010-06-08 NOTE — Telephone Encounter (Signed)
rx sent

## 2010-06-09 ENCOUNTER — Ambulatory Visit
Admission: RE | Admit: 2010-06-09 | Discharge: 2010-06-09 | Disposition: A | Discharge status: Home / Self Care | Payer: Managed Care, Other (non HMO) | Source: Ambulatory Visit | Attending: Gastroenterology | Admitting: Gastroenterology

## 2010-06-09 DIAGNOSIS — R1011 Right upper quadrant pain: Secondary | ICD-10-CM

## 2010-06-09 MED ORDER — GADOBENATE DIMEGLUMINE 529 MG/ML IV SOLN
20.0000 mL | Freq: Once | INTRAVENOUS | Status: AC | PRN
  Administered 2010-06-09: 20 mL via INTRAVENOUS

## 2010-06-10 ENCOUNTER — Telehealth: Payer: Self-pay | Admitting: *Deleted

## 2010-06-10 NOTE — Telephone Encounter (Signed)
Message copied by Jesse Fall on Thu Jun 10, 2010  4:17 PM ------      Message from: Claudette Head      Created: Thu Jun 10, 2010  9:23 AM       Fatty liver      Benign adrenal adenomas      Pancreatic lesion is stable with benign features and does not require and further imaging follow up

## 2010-06-10 NOTE — Telephone Encounter (Signed)
Patient given results as per Dr. Russella Dar.

## 2010-06-10 NOTE — Telephone Encounter (Signed)
Left Message for patient to call me 

## 2010-06-10 NOTE — Telephone Encounter (Signed)
Left message at home number for patient to call me back.

## 2010-06-10 NOTE — Telephone Encounter (Signed)
Message copied by Jesse Fall on Thu Jun 10, 2010 10:19 AM ------      Message from: Claudette Head      Created: Thu Jun 10, 2010  9:23 AM       Fatty liver      Benign adrenal adenomas      Pancreatic lesion is stable with benign features and does not require and further imaging follow up

## 2010-06-10 NOTE — Telephone Encounter (Signed)
Message copied by Jesse Fall on Thu Jun 10, 2010  4:34 PM ------      Message from: Claudette Head      Created: Thu Jun 10, 2010  9:23 AM       Fatty liver      Benign adrenal adenomas      Pancreatic lesion is stable with benign features and does not require and further imaging follow up

## 2010-06-11 ENCOUNTER — Encounter: Payer: Self-pay | Admitting: Family Medicine

## 2010-06-11 ENCOUNTER — Ambulatory Visit (INDEPENDENT_AMBULATORY_CARE_PROVIDER_SITE_OTHER): Payer: Managed Care, Other (non HMO) | Admitting: Family Medicine

## 2010-06-11 DIAGNOSIS — I10 Essential (primary) hypertension: Secondary | ICD-10-CM

## 2010-06-11 DIAGNOSIS — E785 Hyperlipidemia, unspecified: Secondary | ICD-10-CM

## 2010-06-11 MED ORDER — PITAVASTATIN CALCIUM 2 MG PO TABS: 2.0000 mg | ORAL_TABLET | Freq: Every evening | ORAL | Status: DC

## 2010-06-11 NOTE — Patient Instructions (Signed)
Call in about 2 months to get a lab order to recheck your cholesterol and kidney function Continue to work on low fat diet and regular exercise.

## 2010-06-11 NOTE — Progress Notes (Signed)
  Subjective:    Patient ID: Kelly Roth, female    DOB: 06-Mar-1951, 60 y.o.   MRN: [id number]  Hypertension This is a chronic problem. The problem has been gradually improving since onset. The problem is controlled. Pertinent negatives include no chest pain or shortness of breath. There are no associated agents to hypertension. Past treatments include ACE inhibitors. The current treatment provides mild improvement. There are no compliance problems.   tolerating the new ACE well. No SE.    Tolerating lipitor well but too expensive. Over 100 a months. Does occ get sharp pain in her foot that she wondered if related to the statin.    Review of Systems  Respiratory: Negative for shortness of breath.   Cardiovascular: Negative for chest pain.       Objective:   Physical Exam  Constitutional: She appears well-developed and well-nourished.  HENT:  Head: Normocephalic and atraumatic.  Cardiovascular: Normal rate, regular rhythm and normal heart sounds.   Pulmonary/Chest: Effort normal and breath sounds normal.  Musculoskeletal: She exhibits no edema.          Assessment & Plan:

## 2010-06-11 NOTE — Assessment & Plan Note (Signed)
Will change to livalo for cost reason. Lipitor too expensive. Recheck values in 8 weeks and adjust dose as needed.

## 2010-06-11 NOTE — Assessment & Plan Note (Addendum)
AT goal. F/U in 4 months

## 2010-07-12 ENCOUNTER — Other Ambulatory Visit: Payer: Self-pay | Admitting: Family Medicine

## 2010-07-15 ENCOUNTER — Telehealth: Payer: Self-pay | Admitting: Family Medicine

## 2010-07-15 NOTE — Telephone Encounter (Signed)
Pt left message for triage nurse that she received letter from [facility] stating that she will need to get prescriptions as 90 day supplies instead of 30 day supplies.  30 day supplies will now be subsidized.  Same thing will apply for her husband who is also a pt.  She stated she needs refills on three meds currently for herself. (Livalo, feldene, lisinopril). Plan:  Told pt to call her pharmacy even though this would be a change and request for pharm to send refill requests as [place].  Pt voiced understanding.  Told can also do same thing for her husband. Jarvis Newcomer, LPN Domingo Dimes

## 2010-07-16 ENCOUNTER — Telehealth: Payer: Self-pay | Admitting: Family Medicine

## 2010-07-16 DIAGNOSIS — M1990 Unspecified osteoarthritis, unspecified site: Secondary | ICD-10-CM

## 2010-07-16 MED ORDER — PIROXICAM 10 MG PO CAPS: 10.0000 mg | ORAL_CAPSULE | Freq: Two times a day (BID) | ORAL | Status: DC

## 2010-07-16 NOTE — Telephone Encounter (Signed)
Pt called in and said her pharmacy said they do not E prescribe.  Wants her scripts changed from 30 day to 90 day.  Currently will need 90 day on Piroxicam.  Recent office visit 03 26 12.  Last RF 06-07-10 for # 60/2 refills on 30 day schedule. Plan:  Pt notified will send over a script to pharm for Piroxicam 10 mg PO BID , #180/no refills.  O'kd verbal by Dr, Linford Arnold,  [facility] to [facility]. Jarvis Newcomer, LPN Dom[facility] Dimes

## 2010-07-27 NOTE — Assessment & Plan Note (Signed)
[facility]                         GASTROENTEROLOGY OFFICE NOTE   Kelly, Roth [facility]                MRN:          [id number]  DATE:08/21/2006                            DOB:          1950-07-26    Kelly Roth has intermittent right upper quadrant pain and she  describes it as a pressure sensation.  It will occasionally improve with  counter pressure on the area and it occasionally improves with Mylanta  and Tums.  Her recent upper endoscopy revealed a mild antral gastritis  and erosive esophagitis.  Due to a miscommunication, she was not started  on a PPI.  She has only been using Mylanta and Tums on a p.r.n. basis.   CURRENT MEDICATIONS:  Listed on the chart, updated and reviewed.   MEDICATION ALLERGIES:  None known.   EXAM:  Not acute distress.  Weight 250.4 pounds, blood pressure is  136/84, pulse 92 and regular.  She is not re-examined.   ASSESSMENT AND PLAN:  Gastroesophageal reflux disease with erosive  esophagitis and gastritis.  Begin Aciphex 20 mg p.o. q.a.m. with  standard antireflux measures.  May use Tums and Mylanta on a p.r.n.  basis.  She is given instructions on all standard antireflux measures.  Return office visit 6-8 weeks.     Venita Lick. Russella Dar, MD, [facility]  Electronically Signed    MTS/MedQ  DD: 08/21/2006  DT: 08/21/2006  Job #: [id number]   cc:   Nani Gasser, M.D.

## 2010-07-27 NOTE — Assessment & Plan Note (Signed)
[facility]                         GASTROENTEROLOGY OFFICE NOTE   NAME:Roth, Kelly PARADISO                  MRN:          [id number]  DATE:04/03/2007                            DOB:          06-Jul-1950    Kelly Roth returns with complaints of intermittent right upper  quadrant pain and pressure.  The majority of the time her symptoms are  relieved with hyoscyamine sublingually.  She has had occasions where the  hyoscyamine has not improved her symptoms.  She is due for colonoscopy  for polyp surveillance.  She relates no change in bowel habits, melena,  or hematochezia.  She relates that she experienced pain during prior  colonoscopies, and she would like to avoid a painful procedure if  possible.   CURRENT MEDICATIONS:  Listed on the chart, updated and reviewed.   MEDICATION ALLERGIES:  NONE KNOWN.   EXAMINATION:  In no acute distress.  Weight 246.8 pounds.  Blood pressure 128/74.  Pulse 72 and regular.  CHEST:  Clear to auscultation bilaterally.  CARDIAC:  Regular rate and rhythm without murmurs.  ABDOMEN:  Soft and nontender with normoactive bowel sounds.   ASSESSMENT AND PLAN:  1. Recurrent right upper quadrant pain and right lower chest pain      radiating to the back.  Known gallbladder sludge.  Repeat abdominal      ultrasound.  Continue Levsin sublingually p.r.n. If any gallbladder      abnormalities are noted on her ultrasound, we will proceed with      surgical consultation to consider cholecystectomy. I have informed      her that she may have biliary symptoms and I am not certain.  2. Personal history of adenomatous colon polyps.  Surveillance      colonoscopy due.  Risks, benefit, and alternatives to colonoscopy      with possible biopsy and possible polypectomy discussed with the      patient, and she consents to proceed.  This will be scheduled      electively.     Venita Lick. Russella Dar, MD, [facility]  Electronically Signed    MTS/MedQ  DD: 04/03/2007  DT: 04/03/2007  Job #: [id number]   cc:   Nani Gasser, M.D.

## 2010-07-27 NOTE — Assessment & Plan Note (Signed)
[facility]                         GASTROENTEROLOGY OFFICE NOTE   Kelly Roth, Kelly Roth                MRN:          [id number]  DATE:07/25/2006                            DOB:          [birth year]    REASON FOR REFERRAL:  Right upper quadrant pain and right lower chest  pain.   HISTORY OF PRESENT ILLNESS:  Kelly Roth is a 60 year old white female  referred through the courtesy of Dr.  Linford Arnold. The patient relates  about a two month history of recurrent right upper quadrant and right  lower chest pain. She localizes the majority of her symptoms to her  lateral right costal margin. The symptoms are episodic but tend to occur  about an hour after meals. She has noted occasional difficulty  swallowing certain foods but she has had no problems with liquids. She  tried Mylanta on several occasions with no improvement in her symptoms.  She has been taking Aleve or Advil on almost a daily basis for the past  two months for her right-sided pain and knee pain. She was recently  switched to meloxicam and Aleve and Advil were discontinued. She has a  personal history of colon polyps and underwent a prior colonoscopy in  [place]. She states that she was recommended to have a repeat  colonoscopy done in 2009. She did note that her stools were dark last  week, but does not note tarry or sticky stools and she noted no red  blood or frequent stools. 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 A nuclear  medicine gallbladder scan performed on July 06, 2006, showed a normal  ejection fraction at 78% and activity was noted in the small bowel.  There is no family history of  colon cancer, colon polyps or inflammatory  bowel disease.   PAST MEDICAL HISTORY:  1. Hyperlipidemia.  2. Status post bilateral knee replacements.   CURRENT MEDICATIONS:  Listed on the chart, updated and reviewed.   MEDICATION ALLERGIES:  None known.   SOCIAL HISTORY:  Per the handwritten form.   REVIEW OF SYSTEMS:  Per the handwritten form.   PHYSICAL EXAMINATION:  Obese, white female in no acute distress. Height  5 feet, 4.5 inches. Weight 252 pounds. Blood pressure 140/88. Pulse 84  and regular.  HEENT: Anicteric sclerae. Oropharynx clear.  CHEST: Clear to auscultation bilaterally with no chest wall tenderness  appreciated.  CARDIAC: Regular rate and rhythm without murmurs.  ABDOMEN: Soft and nontender. Nondistended. Normoactive bowel sounds. No  palpable organomegaly, masses or hernias.  EXTREMITIES: Without clubbing, cyanosis or edema.  NEUROLOGIC: Alert and oriented x3. Grossly nonfocal.   ASSESSMENT/PLAN:  1. Post-prandial right upper quadrant pain and right costal margin      pain. Gallbladder sludge on ultrasound with a normal gallbladder      ejection fraction. Rule out ulcer  disease, gastritis, duodenitis,      gastroesophageal reflux disease and biliary symptoms. Risks,      benefits and alternatives to upper endoscopy with possible biopsy      discussed with the patient and she consents to proceed. This will      be scheduled electively. Will obtain additional records from Dr.      Shelah Lewandowsky office from recent blood work performed. Obtain stool      Hemoccults.  2. 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 Will plan to proceed with      an abdominal CT scan to further evaluate these findings after the      upper endoscopy has been completed.  3. Personal history of adenomatous colon polyps. Recall colonoscopy in      2009.     Venita Lick. Russella Dar, MD, [facility]  Electronically Signed     MTS/MedQ  DD: 07/25/2006  DT: 07/25/2006  Job #: [id number]   cc:   Nani Gasser, M.D.

## 2010-07-27 NOTE — Assessment & Plan Note (Signed)
[facility]                         GASTROENTEROLOGY OFFICE NOTE   ISABEAU, MCCALLA [facility]                MRN:          [id number]  DATE:10/25/2006                            DOB:          [birth year]    Ms. Kooi has had infrequent episodes of right upper quadrant pain  and pressure.  Mylanta and Tums have helped her symptoms.  At other  times, the pain is severe and nothing seems to help.  Eating small,  frequent meals appears to be quite helpful in controlling her symptoms.  She remains on AcipHex on a daily basis.  Her CT scan of the chest and  abdomen dated Aug 04, 2006 showed a 9 mm well-circumscribed cystic  lesion within the body of the pancreas.  Prior ultrasound imaging in  [place] had shown biliary sludge.  Her [facility] hepatobiliary scan  showed a normal ejection fraction at 78%.   CURRENT MEDICATIONS:  Listed on the chart, updated and reviewed.   MEDICATION ALLERGIES:  None known.   EXAM:  In no acute distress.  Weight 248 pounds, blood pressure 110/80, pulse 80 and regular.  CHEST:  Clear to auscultation bilaterally.  CARDIAC:  Regular rate and rhythm without murmur.  ABDOMEN:  Soft and nontender with normoactive bowel sounds.  No palpable  organomegaly, masses, or hernias.   ASSESSMENT AND PLAN:  Right upper quadrant and right lower chest pain.  Her symptoms are episodic.  I am still concerned about biliary symptoms.  We will plan to increase her AcipHex to 20 mg p.o. b.i.d. and use  Darvocet-N 100 1 to 2 p.o. b.i.d. as needed.  Return office visit in  approximately 8 weeks.  Consider further evaluation with a gastric  emptying scan.  Consider surgical referral for consideration of  cholecystectomy if her symptoms persist.     Judie Petit T. Russella Dar, MD, [facility]  Electronically Signed    MTS/MedQ  DD: 10/31/2006  DT: 11/01/2006  Job #: [id number]   cc:   Nani Gasser, M.D.

## 2010-07-30 NOTE — Op Note (Signed)
NAME:  JANIEL, DERHAMMER NO.:  [id number]   MEDICAL RECORD NO.:  [id number]          PATIENT TYPE:  INP   LOCATION:  X002                         FACILITY:  [facility]   PHYSICIAN:  Erasmo Leventhal, M.D.DATE OF BIRTH:  1950-10-21   DATE OF PROCEDURE:  04/30/2004  DATE OF DISCHARGE:                                 OPERATIVE REPORT   Preoperatively, it is noticed by Ms. Schwarz' CT scan that she had slight  enlargement of the left adrenal gland.  We discussed this with her primary  care physician, Dr. Beverely Risen, and Dr. Beverely Risen recommended proceeding with  surgery but just checking a cortisol level.  We discussed this with the  patient in the holding area.  She wished to proceed with the surgery.  I did  give her the option of canceling the surgery and waiting until later, and  she wished to proceed with this surgery, stated if there was a problem with  the adrenal region that she would like to work it out and treat it later.  She had already made plans to get the knee replacement done.   She also had some EKG changes on her December EKG that was with her.  Subsequent to that, she saw Dr. Harland Dingwall, had a stress test.  The  patient reports it was read as negative, and she was told to come back as  necessary.  We also discussed that with the patient, and she was anxious to  proceed with the left total knee arthroplasty today.  She was completely  asymptomatic from a cardiac or endocrine standpoint.   PREOPERATIVE DIAGNOSIS:  Left knee end-stage osteoarthritis.   POSTOPERATIVE DIAGNOSIS:  Left knee end-stage osteoarthritis.   PROCEDURE:  Left total knee arthroplasty.   SURGEON:  Valma Cava, M.D.   ASSISTANT:  Dr. Durene Romans   FIRST ASSISTANT AND SECOND ASSISTANT:  Mr. Leilani Able, PA-C.   ANESTHESIA:  Spinal epidural.   ESTIMATED BLOOD LOSS:  Less than 100 mL.   DRAINS:  Two mini Hemovac.   COMPLICATIONS:  None.   TOURNIQUET TIME:  1 hour  and 45 minutes at 350 mmHg.   DISPOSITION:  To PACU stable.   OPERATIVE IMPLANTS:  DePuy.  All cemented.  35 mm oval dome patella, size 3  femur, size 2.5 tibia with a size 3, 10 mm rotating platform tibial insert.   OPERATIVE DETAILS:  The patient was counseled in the holding area.  The  above topic was discussed which she wished to proceed.  Preoperative  antibiotics had been given.  She was taken to the OR where the spinal  epidural was administered.  A  Foley catheter was placed utilizing sterile  technique by the OR circulating nurse.  All extremities were well-padded and  bumped.  The left knee was examined.  She had just a slight flexion  contracture.  She could flex to 110 degrees.  She was elevated, prepped with  DuraPrep, and draped in a sterile fashion.  The leg was exsanguinated with  an Esmarch.  Tourniquet was inflated to 350 mmHg.  A straight midline  incision was  made through the skin and subcutaneous tissue.  Medial and  lateral soft tissue flaps were developed.  Medial parapatellar arthrotomy  was performed.  Proximal and medial tibia soft tissue were released.  The  patella was everted; the knee was flexed, end-stage arthritic change bone-on-  bone.  The cruciate ligaments were resected.  Osteophytes were removed from  the distal femur.  A starting hole was made in the distal femur; canal was  irrigated until the effluent was clear.  We chose a 5-degree valgus cut and  took a 10  mm cut off the distal femur.  The distal femur was found to be a  size #3.  Rotational marks were made.  We felt we had excellent external  rotation.  Cutting block was applied, and the distal femur was cut to fit a  size #3.  Medial and lateral menisci were removed under direct  visualization.  Geniculate vessels were coagulated.  The posterior  neurovascular structures were __________ and protected throughout the entire  case.  Tibial eminence was resected.  Proximal tibia was found to be a  size  2.5.  Starting hole was made.  A step reamer was utilized.  Canal was  irrigated until the effluent was clear.  Intramedullary rod was gently  placed.  I chose a 0-degree slope with a 10 mm cut based upon the lateral  side which was the high side.  Distal and proximal tibia was now cut.  The  femoral box was then prepared.  Posteromedial and posterolateral femoral  osteophytes were removed under direct visualization.   The 3 femur, size 2.5 tibia and with a 10 insert, we had excellent range of  motion.  Flexion and extension gaps were well-balanced.  Rotational marks  were made.  The patella was found to be 23 mm.  It was cut down 9 mm for a  total of 14 mm left.  It was found to be a size 32.  Locking holes were  made.  At this point in time, the punch and keel were then prepared into the  tibia.  The knee was irrigated with pulsatile lavage, utilized modern cement  technique.  All components were cemented into place.  Size 2.5 tibia, size 3  femur, 32 mm patella.  After the cement had cured, with a 10 mm trial, we  had excellent range of motion in flexion and extension.  Patellofemoral  tracking was anatomic.  Did not require a lateral release.  Tibial trial was  removed.  Excess cement was removed, and we found we had excellent position,  then we put in a size 3, 10 mm thick rotating platform tibial insert.  This  was reduced.  The knee was then put through a range of motion.  We were very  stable in all our aspects and very pleased.  The knee joint was irrigated  during the closure as was soft tissue.  Two medium Hemovac drains were  placed through a separate puncture wound.   Closure of layers was done sequentially, arthrotomy with Vicryl, subcu  Vicryl, skin subcuticular Monocryl suture.  Steri-Strips were applied.  A  sterile compressive dressing applied.  Tourniquet was deflated.  Normal circulation of the foot and ankle at the end of the case.  Drain was hooked  to  suction.  Another gram of Ancef given intravenously.  The patient  tolerated this procedure well.  There were no complications, no problems.  She was then taken to the recovery  room and taken in stable condition.   To decrease surgical time and to help throughout the entire surgical  procedure, Dr. Nilsa Nutting and Mr. Chabon's assistance was needed.      RAC/MEDQ  D:  04/30/2004  T:  04/30/2004  Job:  [id number]

## 2010-07-30 NOTE — Op Note (Signed)
NAME:  Kelly Roth, Kelly Roth NO.:  [id number]   MEDICAL RECORD NO.:  [id number]          PATIENT TYPE:  INP   LOCATION:  X004                         FACILITY:  [facility]   PHYSICIAN:  Erasmo Leventhal, M.D.DATE OF BIRTH:  [birth year]   DATE OF PROCEDURE:  05/27/2005  DATE OF DISCHARGE:                                 OPERATIVE REPORT   PREOPERATIVE DIAGNOSIS:  Right knee end-stage osteoarthritis.   POSTOPERATIVE DIAGNOSIS:  Right knee end-stage osteoarthritis.   PROCEDURE:  Right total knee arthroplasty.   SURGEON:  Erasmo Leventhal, M.D.   ASSISTANT:  Jaquelyn Bitter. Chabon, P.A.   ANESTHESIA:  Spinal Duramorph.   ESTIMATED BLOOD LOSS:  Less than 50 mL.   DRAINS:  Two medium hemovac.   COMPLICATIONS:  None.   TOURNIQUET TIME:  1 hour and 20 minutes at 300 mmHg.   DISPOSITION:  PACU stable.   DESCRIPTION OF PROCEDURE:  The patient was counseled in the holding area,  correct side was identified, IV started, antibiotics were given. Taken to  the operating room where the spinal was administered. Utilizing sterile  technique, a Foley catheter was placed by the circulating nurse. All  extremities well padded and bumped. The right knee was examined, 5 degree  flexion contracture, flexed to 120 degrees, elevated, prepped with DuraPrep,  draped in a sterile fashion. Exsanguinated with esmarch, tourniquet was  inflated to 300 mmHg. A straight midline incision was made in the skin and  subcutaneous tissue, medial and lateral soft tissue flaps were developed at  the appropriate level and a medial parapatellar arthrotomy was performed.  Proximal medial soft tissue release was done. The patient had fallen on a  needle as a child and had a retained foreign body on her plain x-rays. At  this point in time, I encountered it in the fat pad, the area was excised,  the patient desired to have this later and it was removed as it was  encountered during part of the surgical  procedure. The wound was irrigated.  There was no evidence of infection simply chronic retained foreign material.  The knee was flexed, end-stage arthritic changes, cruciate ligaments were  resected, starting hole made in the distal femur, canal was irrigated, the  effluent was clear. The intramedullary rod was gently placed, we chose a 5  degree valgus cut and took a 10 mm cut off the distal femur. The distal  femur was sized and found to be a size 3. Rotation marks were made and  distal femur was cut to fit a size 3. Medial and lateral menisci removed,  tibial eminence was resected, proximal tibia was found to be a size 2.5,  central area was found. Starting hole, step reamer, irrigation,  intramedullary rod was placed. I chose a 10 mm cut based upon the lateral  side which was the least deficient. This was done with a zero degree slope.  Posteromedial and posterolateral femoral osteophytes removed under direct  visualization. Posterior neurovascular structures were thought of and  protected throughout the entire case. With flexion extension blocks, we had  excellent flexion extension gaps with 10  mm inserts.   The tibial base plate was applied, rotation and coverage was set and reamer  and punch was then performed. The femoral box cut was then prepared in  standard fashion. At this time with a size 3 femur, size 2.5 tibia with a 10  insert, we had excellent range of motion and soft tissue balance. The  patella was found to be a size 32, remainder of bone was resected, locking  holes were made and the patella was applied with anatomic tracking. Trials  were then removed. The knee was irrigated with pulsatile lavage copiously.  The cement was mixed with modern cement technique. All components were  cemented into place. A size 2.5 tibia, size 3 femur for the 10 trial and the  32 patella. The trial was set, excess cement was removed, knee was again  irrigated. With the 12.5 tibial insert at  this point in time, we had  excellent range of motion and soft tissue balance, varus and valgus  stressing, patellofemoral tracking was anatomic. The trial was then removed,  the knee was again irrigated and a final 12.5 mm posterior stabilized tibial  insert was implanted giving excellent range of motion, soft tissue balance,  alignment of the knee. The wounds were copiously irrigated. Two medium  hemovac drains were placed. Arthrotomy closed with Vicryl, subcu Vicryl.  Arthrotomy was also closed with 90 degrees of flexion. The skin closed with  subcuticular Monocryl suture, drain was hooked to suction. A sterile  dressing was applied to the knee, tourniquet was deflated, normal  circulation in the foot and ankle at the end of the case. Ice pack to the  knee, __________ full extension. The patient tolerated the procedure well  with no complications. Surgeon was Valma Cava, assistant was Leilani Able, PA-C.   To help with surgical technique throughout the entire surgical procedure,  Mr. Brett Canales Chabon's assistance was needed.           ______________________________  Erasmo Leventhal, M.D.     RAC/MEDQ  D:  05/27/2005  T:  05/30/2005  Job:  [id number]

## 2010-07-30 NOTE — H&P (Signed)
NAME:  Kelly Roth, Kelly NO.:  [id number]   MEDICAL RECORD NO.:  [id number]          PATIENT TYPE:   LOCATION:                                 FACILITY:   PHYSICIAN:  Erasmo Leventhal, M.D.DATE OF BIRTH:  08-18-1950   DATE OF ADMISSION:  DATE OF DISCHARGE:                                HISTORY & PHYSICAL   CHIEF COMPLAINT:  End-stage osteoarthritis of right knee.   HISTORY OF PRESENT ILLNESS:  Briefly, this is a 60 year old lady with a  history of end-stage osteoarthritis of both knees who has undergone previous  total knee arthroplasty of the left knee with good result.  Due to continued  pain and failure of conservative treatment for her right knee she is now  scheduled for total knee arthroplasty of her right knee. The surgery risks  and benefits and aftercare were discussed again in detail with the patient,  questions invited and answered and surgery to go ahead is scheduled. She has  had medical clearance from her medical doctor.   PAST MEDICAL HISTORY:  Serious medical illnesses:  None.   ALLERGIES:  No known drug allergies.   CURRENT MEDICATIONS:  Calcium and vitamins.   PAST SURGICAL HISTORY:  Left total knee arthroplasty.  Heel spur excisions  and arthroscopic surgery right knee.   SOCIAL HISTORY:  The patient is married.  She works at [employer] as a  Production designer, theatre/television/film.  She smokes one-third of a pack of cigarettes a day and drinks  rarely.   FAMILY HISTORY:  Positive for hypertension and hypercholesterolemia.   REVIEW OF SYSTEMS:  HEENT system negative for headache, blurred vision,  dizziness.  PULMONARY:  Positive for exertional shortness of breath,  otherwise negative.  CARDIOVASCULAR:  Negative for chest pain or  palpitations.  GASTROINTESTINAL:  Negative for ulcers, hepatitis.  GENITOURINARY:  Negative for urinary tract difficulty.  MUSCULOSKELETAL:  Positive as in history of present illness.   PHYSICAL EXAMINATION:  VITAL SIGNS:  Blood  pressure 130/82, respirations 16,  pulse 74 and regular.  GENERAL APPEARANCE:  This is a well-developed, well-nourished lady in no  acute distress.  HEENT:  Head normocephalic. Nose patent, ears patent.  Pupils equal, round,  reactive to light. Throat without injection.  NECK:  Supple without adenopathy.  Carotids 2+ without bruits  CHEST:  Clear to auscultation with no rales or rhonchi.  Respirations 16.  HEART:  Regular rate and rhythm at 74 beats per minute without murmur.  ABDOMEN:  Soft with active bowel sounds.  No masses or organomegaly.  NEUROLOGICAL:  Patient alert and oriented to time, place and person.  Cranial nerves II-XII grossly intact.  EXTREMITIES:  Extremities show left knee status post total knee arthroplasty  with 0 to 120 degrees range of motion, excellent stability, no pain.  The  right knee shows 0 to 125 degree range of motion with crepitation, slight  varus deformity.  Dorsalis pedis pulse and posterior tibialis pulses are 2+.   CLINICAL DATA:  X-rays show end-stage osteoarthritis of right knee.  There  is a needle fragment in the right knee from previous injury in  the past.   IMPRESSION:  End-stage osteoarthritis right knee.   PLAN:  Total knee arthroplasty right knee.      Kelly Roth. Chabon, P.A.    ______________________________  Erasmo Leventhal, M.D.    SJC/MEDQ  D:  05/16/2005  T:  05/16/2005  Job:  [id number]

## 2010-07-30 NOTE — Discharge Summary (Signed)
NAME:  Kelly Roth, Kelly Roth NO.:  [id number]   MEDICAL RECORD NO.:  [id number]          PATIENT TYPE:  INP   LOCATION:  1514                         FACILITY:  [facility]   PHYSICIAN:  Erasmo Leventhal, M.D.DATE OF BIRTH:  [birth year]   DATE OF ADMISSION:  05/27/2005  DATE OF DISCHARGE:  06/01/2005                                 DISCHARGE SUMMARY   ADMISSION DIAGNOSES:  End-stage osteoarthritis of the right knee.   DISCHARGE DIAGNOSES:  End-stage osteoarthritis of the right knee.   OPERATION:  Total knee arthroplasty, right knee.   BRIEF HISTORY:  This is a 60 year old lady with a history of end-stage  osteoarthritis of both knees with previous total knee arthroplasty of the  left knee with a good result whose failed conservative management of her  right knee and now is scheduled for total knee arthroplasty of her right  knee.   LABORATORY DATA:  Admission CBC within normal limits with the exception of a  slightly high hemoglobin at 15.2. Hemoglobin reached a low of 10.8,  hematocrit reached a low of 31.3 on the 19th. Initial PT and PTT within  normal limits. INR at 1.8 at discharge. Initial CMET within normal limits  with the exception of a slightly high glucose at 112, she ran mildly  elevated glucose throughout admission and had one small bout of hyponatremia  on the 17th at 133 which corrected back to 135 by the 19th.   HOSPITAL COURSE:  The patient tolerated the operative procedure well. The  first postoperative day, vital signs were stable, had a mildly elevated  white count and was afebrile. Incentive spirometry was increased. The second  postoperative day, vital signs were stable, noted to have mild hyponatremia,  free water was held, her dressing was changed, wound was benign, calves were  negative and therapy was initiated. The third postoperative day, she was  feeling okay, vital signs were stable, she had mild tachycardia at 111, O2  94 on room air.  White count was back down to 11.2, hemoglobin and hematocrit  were stable at 10.8 and 31.3. I&Os were good, lungs were clear, heart sounds  normal, bowel sounds sluggish. The dressing was changed, there was mild  redness on the wound and she was started on Keflex 250 p.o. q.i.d. The  fourth postoperative day she was feeling okay, vital signs stable, temp to a  max of 99.9 now 97.6, O2 98 on room air, lungs clear, bowel sounds active.  Calf was negative, wound had decreasing redness, no induration or drainage.  Discharge plans were made for the morning and on the March 21, she was  feeling good, her vital signs were stable, she was afebrile, INR 1.8, lungs  clear, bowel sounds active, heart sounds normal, wound benign, calves  negative, the patient was subsequently discharged home for a followup in the  office.   CONDITION ON DISCHARGE:  Improved.   DISCHARGE MEDICATIONS:  1.  Percocet 1-2 q.4-6 h p.r.n. pain.  2.  Robaxin 500, 1 p.o. q.8 h p.r.n. spasm.  3.  Trinsicon 1 b.i.d. for anemia.  4.  Coumadin per pharmacy  protocol.   She is to do her home CPM, do her home exercise program. We will check her  back in the office in 10 days or sooner p.r.n. problems.      Jaquelyn Bitter. Chabon, P.A.    ______________________________  Erasmo Leventhal, M.D.    SJC/MEDQ  D:  06/01/2005  T:  06/01/2005  Job:  [id number]

## 2010-07-30 NOTE — Discharge Summary (Signed)
NAME:  Kelly Roth, Kelly Roth NO.:  [id number]   MEDICAL RECORD NO.:  [id number]          PATIENT TYPE:  INP   LOCATION:  0454                         [facility]ACILITY:  [facility]   PHYSICIAN:  Erasmo Leventhal, M.D.DATE OF BIRTH:  1950-12-01   DATE OF ADMISSION:  04/30/2004  DATE OF DISCHARGE:  05/04/2004                                 DISCHARGE SUMMARY   ADMISSION DIAGNOSIS:  End-stage osteoarthritis, left knee.   DISCHARGE DIAGNOSIS:  End-stage osteoarthritis, left knee.   OPERATION/PROCEDURE:  Total knee arthroplasty, left knee.   BRIEF HISTORY:  This is a 60 year old lady with history of end-stage  osteoarthritis of the left knee, had failed conservative management.  After  explanation surgery, risks, benefits, and options, the patient was scheduled  for total knee arthroplasty of the left knee.   LABORATORY DATA:  Admission CBC within normal limits with the exception of  hemoglobin elevated at 15.4.  Hemoglobin and hematocrit decreased to a low  of 11.6 and 33.6 on February 20.  Admission PT/PTT showed the PT slightly  low at 11.5 on admission.  Otherwise normal.  Discharge PT was 16.3 with INR  of 1.5.  Admission CMET within normal limits with the exception of elevated  glucose of 100.  She ran elevated glucose throughout admission up to a high  of 161.  She had a slight episode of hyponatremia on February 19 which was  corrected by February 20.  She had a slightly high total bilirubin of 1.4 on  admission.  Urinalysis within normal limits.   HOSPITAL COURSE:  The patient tolerated the operative procedure well.  On  the first postoperative day, vital signs were stable.  Temperature afebrile.  Neurovascular status was intact in the leg.  Calves were soft.  Abdomen was  soft.  Drain was removed without difficulty.  She had slight hyponatremia  and her IV was switched to normal saline.  Second postoperative day she was  doing well.  Had no complaints.  Pain seemed  acceptable.  Vital signs were  stable, afebrile.  Neurovascular status intact.  Dressing was changed.  Wound was benign.  CPM was continued.  PT/OT was continued.  Third  postoperative day she was feeling okay.  Vital signs were stable.  She was  afebrile.  Had a mild tachycardia to 102.  Hemoglobin and hematocrit were  stable.  WBC was slightly elevated at 12.2, glucose 125.  She was started on  Lovenox.  Her INR was not responding to Coumadin.  Lungs were clear.  Bowel  sounds were active.  Calves were negative.  She was having episodes of  frequent urination.  Urinalysis was checked.  Plans for made for discharge  if she was doing well in the a.m.  The following day on May 04, 2004  the patient was doing good. Bowel sounds were stable.  Afebrile.  PT was  16.2 with INR 1.5.  Urinalysis was normal.  Dressing was changed.  Wound was  benign.  Lungs clear.  Bowel sounds active.  Calves were negative.  The  patient subsequently discharged home for followup in the office.  CONDITION ON DISCHARGE:  Improved.   DISCHARGE MEDICATIONS:  1.  Percocet 1-2 q.4-6h. p.r.n. pain.  2.  Robaxin 500 one p.o. at night p.r.n. spasm.  3.  Trinsicon one b.i.d. for anemia.  4.  Coumadin per pharmacy protocol.  5.  Lovenox 40 mg one subcutaneously daily until Coumadin therapeutic.   FOLLOW UP:  In the office in 10 days.   DISCHARGE INSTRUCTIONS:  She is instructed to do  her home therapy, do her  CPM and call if problems or questions arise.      SJC/MEDQ  D:  05/13/2004  T:  05/14/2004  Job:  [id number]

## 2010-09-03 ENCOUNTER — Encounter: Payer: Self-pay | Admitting: Family Medicine

## 2010-09-03 ENCOUNTER — Telehealth: Payer: Self-pay | Admitting: Family Medicine

## 2010-09-03 ENCOUNTER — Ambulatory Visit (INDEPENDENT_AMBULATORY_CARE_PROVIDER_SITE_OTHER): Payer: Managed Care, Other (non HMO) | Admitting: Family Medicine

## 2010-09-03 DIAGNOSIS — H109 Unspecified conjunctivitis: Secondary | ICD-10-CM

## 2010-09-03 DIAGNOSIS — I10 Essential (primary) hypertension: Secondary | ICD-10-CM

## 2010-09-03 MED ORDER — LISINOPRIL 10 MG PO TABS: 20.0000 mg | ORAL_TABLET | Freq: Every day | ORAL | Status: DC

## 2010-09-03 MED ORDER — CIPROFLOXACIN HCL 0.3 % OP SOLN: 1.0000 [drp] | OPHTHALMIC | Status: AC

## 2010-09-03 NOTE — Progress Notes (Signed)
  Subjective:    Patient ID: Kelly Roth, female    DOB: 01/07/1951, 60 y.o.   MRN: [id number]  HPI Left eye has been red for one week. Woke up with swelling this AM. Getting occ crusting and discharge. No vison changes  No pian but feels irritated.  No URI or fever. No drops in it. Wears glasses but not contacts   Did take her BP pill this AM as she is not sure why her blood pressure is elevated today. No headaches or dizziness.  Review of Systems     Objective:   Physical Exam  Constitutional: She is oriented to person, place, and time. She appears well-developed and well-nourished.  HENT:  Head: Normocephalic and atraumatic.  Right Ear: External ear normal.  Left Ear: External ear normal.  Eyes: EOM are normal. Pupils are equal, round, and reactive to light.       Right conjunctiva and sclera are normal. Left sclera are severely injected. I don't see any crusting or discharge at the corners but she does have some upper and lower lid edema.  Cardiovascular: Normal rate, regular rhythm and normal heart sounds.   Pulmonary/Chest: Effort normal and breath sounds normal.  Neurological: She is alert and oriented to person, place, and time.  Skin: Skin is warm and dry.  Psychiatric: She has a normal mood and affect.          Assessment & Plan:  Conjunctivitis-will treat with antibiotic drops. If she's not significantly better in the next 4-5 days please let me know. If she experiences any vision changes or eye pain then she needs to get her eyes examined immediately.

## 2010-09-03 NOTE — Patient Instructions (Signed)
Call if not better in 5 days.   Follow up in 3-4 weeks for your blood pressure.

## 2010-09-03 NOTE — Assessment & Plan Note (Signed)
Her blood pressure is not well controlled today. We will increase her lisinopril to 2 tabs since she recently filled a 90 day supply of her 10 mg tablets. I did adjust the medication list. I like to see her back in 3 or 4 weeks to recheck her blood pressure and make sure she is at goal.

## 2010-09-06 NOTE — Telephone Encounter (Signed)
Closed

## 2010-09-23 ENCOUNTER — Other Ambulatory Visit: Payer: Self-pay | Admitting: Family Medicine

## 2010-10-11 ENCOUNTER — Other Ambulatory Visit: Payer: Self-pay | Admitting: Family Medicine

## 2010-10-11 NOTE — Telephone Encounter (Signed)
Pt is due for a 4 week follow up for the BP.  Please have the pt call the office to schedule an appointment before anymore refills will be sent.

## 2010-10-12 ENCOUNTER — Other Ambulatory Visit: Payer: Self-pay | Admitting: Family Medicine

## 2010-10-13 ENCOUNTER — Other Ambulatory Visit: Payer: Self-pay | Admitting: Family Medicine

## 2010-10-14 ENCOUNTER — Other Ambulatory Visit: Payer: Self-pay | Admitting: *Deleted

## 2010-10-14 MED ORDER — PIROXICAM 10 MG PO CAPS: 10.0000 mg | ORAL_CAPSULE | Freq: Two times a day (BID) | ORAL | Status: DC

## 2010-10-14 MED ORDER — LISINOPRIL 10 MG PO TABS: 10.0000 mg | ORAL_TABLET | Freq: Two times a day (BID) | ORAL | Status: DC

## 2010-11-07 ENCOUNTER — Other Ambulatory Visit: Payer: Self-pay | Admitting: Family Medicine

## 2010-11-15 ENCOUNTER — Other Ambulatory Visit: Payer: Self-pay | Admitting: Family Medicine

## 2010-12-07 ENCOUNTER — Other Ambulatory Visit: Payer: Self-pay | Admitting: Family Medicine

## 2010-12-16 ENCOUNTER — Telehealth: Payer: Self-pay | Admitting: Family Medicine

## 2010-12-16 NOTE — Telephone Encounter (Signed)
Closed

## 2010-12-17 ENCOUNTER — Other Ambulatory Visit: Payer: Self-pay | Admitting: Family Medicine

## 2011-01-04 ENCOUNTER — Other Ambulatory Visit: Payer: Self-pay | Admitting: Family Medicine

## 2011-01-15 ENCOUNTER — Other Ambulatory Visit: Payer: Self-pay | Admitting: Family Medicine

## 2011-01-18 ENCOUNTER — Other Ambulatory Visit: Payer: Self-pay | Admitting: Family Medicine

## 2011-02-07 ENCOUNTER — Other Ambulatory Visit: Payer: Self-pay | Admitting: Family Medicine

## 2011-02-17 ENCOUNTER — Other Ambulatory Visit: Payer: Self-pay | Admitting: Family Medicine

## 2011-02-22 ENCOUNTER — Encounter: Payer: Self-pay | Admitting: Family Medicine

## 2011-02-22 ENCOUNTER — Ambulatory Visit (INDEPENDENT_AMBULATORY_CARE_PROVIDER_SITE_OTHER): Payer: Managed Care, Other (non HMO) | Admitting: Family Medicine

## 2011-02-22 DIAGNOSIS — Z716 Tobacco abuse counseling: Secondary | ICD-10-CM

## 2011-02-22 DIAGNOSIS — Z23 Encounter for immunization: Secondary | ICD-10-CM

## 2011-02-22 DIAGNOSIS — M7989 Other specified soft tissue disorders: Secondary | ICD-10-CM

## 2011-02-22 DIAGNOSIS — Z7189 Other specified counseling: Secondary | ICD-10-CM

## 2011-02-22 DIAGNOSIS — R233 Spontaneous ecchymoses: Secondary | ICD-10-CM

## 2011-02-22 MED ORDER — MELOXICAM 7.5 MG PO TABS: 7.5000 mg | ORAL_TABLET | Freq: Every day | ORAL | Status: DC

## 2011-02-22 NOTE — Progress Notes (Signed)
  Subjective:    Patient ID: Kelly Roth, female    DOB: Apr 02, 1950, 60 y.o.   MRN: [id number]  HPI Is here because of swelling in her left leg. The swelling is in the left lower leg and is along with swelling she has noticed petechiae hemorrhages on both sides the leg as well. She denies any trauma, twisting her leg or falling . This all started about 3 weeks ago and while there has been some improvement in the swelling because of petechiae that appeared later she became concerned. Another concern that she has is with ambulation and she is on her feet a lot working at [employer] [place]. 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 #2 need for flu vaccination #3 patient does smoke Review of Systems  Respiratory: Negative for choking, shortness of breath, wheezing and stridor.   Cardiovascular: Positive for leg swelling. Negative for palpitations.  All other systems reviewed and are negative.       Objective:   Physical Exam BP 140/66  Pulse 90  Temp(Src) 98.2 F (36.8 C) (Oral)  Ht 5\' 4"  (1.626 m)  Wt 264 lb (119.75 kg)  BMI 45.32 kg/m2  SpO2 96%  Obese white female no acute distress the left leg is swollen there is petechiae on both sides of the leg negative Homans sign,  pulses intact right leg is unremarkable. There is some swelling in the foot and lower leg area as well.     Assessment & Plan:  #1 will discuss with patient about getting a Doppler study of the left lower leg to make sure there is no clots present Because of the swelling and the petechiae we'll also get CBC to make sure there is no coagulation  Problem Been putting ice on the leg 20 minutes out of 2 hours right with an Ace at night and as long as the studies are negative just observe it have her recheck in 4-8 weeks. #2 administer flu vaccination #3 tobacco counseling done Return followup have [place] in 2-3 months for her yearly exam

## 2011-02-22 NOTE — Patient Instructions (Signed)
Knee Wraps (Elastic Bandage) and RICE  Knee wraps come in many different shapes and sizes and perform many different functions. Some wraps may provide cold therapy or warmth. Your caregiver will help you to determine what is best for your protection, or recovery following your injury. The following are some general tips to help you use a knee wrap:  · Use the wrap as directed.   · Do not keep the wrap so tight that it cuts off the circulation of the leg below the wrap.   · If your lower leg becomes blue, loses feeling, or becomes swollen below the wrap, it is probably too tight. Loosen the wrap as needed to improve these problems.   · See your caregiver or trainer if the wrap seems to be making your problems worse rather than better.   Wraps in general help to remind you that you have an injury. They provide limited support. The few pounds of support they provide are minimal considering the hundreds of pounds of pressure it takes to injure a joint or tear ligaments.   The routine care of many injuries includes Rest, Ice, Compression, and Elevation (RICE).  · Rest is required to allow your body to heal. Generally following bumps and bruises, routine activities can be resumed when comfortable. Injured tendons (cord-like structures that attach muscle to bone) and bones take approximately 6 to 12 weeks to heal.   · Ice following an injury helps keep the swelling down and reduces pain. Do not apply ice directly to skin. Apply ice bags for 20-30 minutes every 3-4 hours for the first 2-3 days following injury or surgery. Place ice in a plastic bag with a towel around it.   · Compression helps keep swelling down, gives support, and helps with discomfort. If a knee wrap has been applied, it should be removed and reapplied every 3 to 4 hours. It should be applied firmly enough to keep swelling down, but not too tightly. Watch your lower leg and toes for swelling, bluish discoloration, coldness, numbness or excessive pain. If  any of these symptoms (problems) occur, remove the knee wrap and reapply more loosely. If these symptoms persist, contact your caregiver immediately.   · Elevation helps reduce swelling, and decreases pain. With extremities (arms/hands and legs/feet), the injured area should be placed near to or above the level of the heart if possible.   Persistent pain and inability to use the injured area for more than 2 to 3 days are warning signs indicating that you should see a caregiver for a follow-up visit as soon as possible. Initially, a hairline fracture (this is the same as a broken bone) may not be seen on x-rays.   Persistent pain and swelling mean limitedweight bearing (use of crutches as instructed) should continue. You may need further x-rays.   X-rays may not show a non-displaced fracture until a week or ten days later. Make a follow-up appointment with your caregiver. A radiologist (a specialist in reading x-rays) will re-read your x-rays. Make sure you know how to get your x-ray results. Do not assume everything is normal if you do not hear from your caregiver.  MAKE SURE YOU:   · Understand these instructions.   · Will watch your condition.   · Will get help right away if you are not doing well or get worse.   Document Released: 08/20/2001 Document Revised: 11/10/2010 Document Reviewed: 06/19/2008  [employer]® Patient Information ©2012 [employer].

## 2011-02-24 ENCOUNTER — Ambulatory Visit
Admission: RE | Admit: 2011-02-24 | Discharge: 2011-02-24 | Disposition: A | Discharge status: Home / Self Care | Payer: Managed Care, Other (non HMO) | Source: Ambulatory Visit | Attending: Family Medicine | Admitting: Family Medicine

## 2011-02-24 DIAGNOSIS — M7989 Other specified soft tissue disorders: Secondary | ICD-10-CM

## 2011-03-04 LAB — CBC WITH DIFFERENTIAL/PLATELET
Basophils Absolute: 0 10*3/uL (ref 0.0–0.1)
Eosinophils Absolute: 0.2 10*3/uL (ref 0.0–0.7)
Eosinophils Relative: 2 % (ref 0–5)
Lymphocytes Relative: 19 % (ref 12–46)
MCV: 95.7 fL (ref 78.0–100.0)
Platelets: 182 10*3/uL (ref 150–400)
RDW: 13.4 % (ref 11.5–15.5)
WBC: 11.5 10*3/uL — ABNORMAL HIGH (ref 4.0–10.5)

## 2011-03-10 ENCOUNTER — Encounter: Payer: Self-pay | Admitting: *Deleted

## 2011-03-10 ENCOUNTER — Emergency Department (INDEPENDENT_AMBULATORY_CARE_PROVIDER_SITE_OTHER)
Admission: EM | Admit: 2011-03-10 | Discharge: 2011-03-10 | Disposition: A | Discharge status: Home / Self Care | Payer: Managed Care, Other (non HMO) | Source: Home / Self Care | Attending: Family Medicine | Admitting: Family Medicine

## 2011-03-10 DIAGNOSIS — J209 Acute bronchitis, unspecified: Secondary | ICD-10-CM

## 2011-03-10 HISTORY — DX: Essential (primary) hypertension: I10

## 2011-03-10 MED ORDER — AMOXICILLIN 875 MG PO TABS: 875.0000 mg | ORAL_TABLET | Freq: Two times a day (BID) | ORAL | Status: AC

## 2011-03-10 MED ORDER — BENZONATATE 200 MG PO CAPS: 200.0000 mg | ORAL_CAPSULE | Freq: Every day | ORAL | Status: AC

## 2011-03-10 NOTE — ED Provider Notes (Signed)
History     CSN: [id number]  Arrival date & time 03/10/11  4098   First MD Initiated Contact with Patient 03/10/11 1113      Chief Complaint  Patient presents with  . Cough     HPI Comments: Patient complains of approximately 1.5 week history of gradually progressive URI symptoms beginning with a mild sore throat (now improved), followed by progressive nasal congestion.  A cough started next..  Complains of fatigue but no myalgias.  Cough is now worse at night and generally non-productive during the day. There has been no pleuritic pain, shortness of breath, or wheezes.  Over the past several days her sputum has become dark, and she has had chills. She has had a flu shot.  She smokes 1 pack per day    Patient is a 60 y.o. female presenting with cough. The history is provided by the patient.  Cough This is a new problem. The current episode started more than 1 week ago. The problem occurs constantly. The cough is productive of sputum. The maximum temperature recorded prior to her arrival was 100 to 100.9 F. Associated symptoms include chills, rhinorrhea and sore throat. Pertinent negatives include no chest pain, no sweats, no weight loss, no ear congestion, no ear pain, no headaches, no myalgias, no shortness of breath, no wheezing and no eye redness. She has tried cough syrup for the symptoms. The treatment provided no relief. She is a smoker.    Past Medical History  Diagnosis Date  . Arthritis   . Osteoarthritis   . Hyperlipidemia   . Hemorrhoids   . GERD (gastroesophageal reflux disease)   . Adenomatous polyps 2004     Colonoscopy done in [facility]  . Hypertension     Past Surgical History  Procedure Date  . 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 Cholecystectomy 05-08-07  . Multiple tooth extractions     Family History  Problem Relation Age of Onset  . Depression Mother   . Hypertension Mother   . Hyperlipidemia Mother   . Cancer Mother     lung  .  Hyperlipidemia Father   . Hypertension Father   . Colon cancer Neg Hx     Neg     History  Substance Use Topics  . Smoking status: Current Everyday Smoker -- 1.0 packs/day  . Smokeless tobacco: Never Used  . Alcohol Use: 0.0 oz/week     one drink a week     OB History    Grav Para Term Preterm Abortions TAB SAB Ect Mult Living                  Review of Systems  Constitutional: Positive for chills. Negative for weight loss.  HENT: Positive for sore throat and rhinorrhea. Negative for ear pain.   Eyes: Negative for redness.  Respiratory: Positive for cough. Negative for shortness of breath and wheezing.   Cardiovascular: Negative for chest pain.  Musculoskeletal: Negative for myalgias.  Neurological: Negative for headaches.   + sore throat  + cough No pleuritic pain No wheezing + nasal congestion + post-nasal drainage No sinus pain/pressure No itchy/red eyes No earache No hemoptysis No SOB + low grade fever/chills No nausea No vomiting No abdominal pain No diarrhea No urinary symptoms No skin rashes + fatigue No myalgias No headache Used OTC meds without relief  Allergies  Review of patient's allergies indicates no known allergies.  Home Medications   Current Outpatient  Rx  Name Route Sig Dispense Refill  . AMOXICILLIN 875 MG PO TABS Oral Take 1 tablet (875 mg total) by mouth 2 (two) times daily. 20 tablet 0  . BENZONATATE 200 MG PO CAPS Oral Take 1 capsule (200 mg total) by mouth at bedtime. Take as needed for cough 12 capsule 0  . OMEGA-3 FATTY ACIDS 1000 MG PO CAPS Oral Take 2 g by mouth daily.      Marland Kitchen LISINOPRIL 10 MG PO TABS Oral Take 2 tablets (20 mg total) by mouth daily. 30 tablet 3  . LIVALO 2 MG PO TABS  TAKE 1 TABLET EVERY DAY 30 tablet 6  . MELOXICAM 7.5 MG PO TABS Oral Take 1 tablet (7.5 mg total) by mouth daily. Do not take w/motrin alleve or excedrin 30 tablet 2  . MULTIVITAMINS PO CAPS Oral Take 1 capsule by mouth daily.      Marland Kitchen PIROXICAM  10 MG PO CAPS Oral Take 1 capsule (10 mg total) by mouth 2 (two) times daily. 120 capsule 0    BP 160/86  Pulse 93  Temp(Src) 99 F (37.2 C) (Oral)  Resp 20  Ht 5\' 4"  (1.626 m)  Wt 262 lb 8 oz (119.069 kg)  BMI 45.06 kg/m2  SpO2 93%  Physical Exam Nursing notes and Vital Signs reviewed. Appearance:  Patient appears healthy, stated age, and in no acute distress.  Patient is morbidly obese (BMI 45.2) Eyes:  Pupils are equal, round, and reactive to light and accomodation.  Extraocular movement is intact.  Conjunctivae are not inflamed  Ears:  Canals normal.  Tympanic membranes normal.  Nose:  Mildly congested turbinates.  No sinus tenderness.   Pharynx:  Normal Neck:  Supple.   No adenopathy Lungs:  Clear to auscultation.  Breath sounds are equal.  Heart:  Regular rate and rhythm without murmurs, rubs, or gallops.  Abdomen:  Nontender without masses or hepatosplenomegaly.  Bowel sounds are present.  No CVA or flank tenderness.  Extremities:  No edema.  No calf tenderness Skin:  No rash present.   ED Course  Procedures  none      1. Acute bronchitis       MDM  Begin amoxicillin; Tessalon at night for cough  Take Mucinex (guaifenesin) twice daily for congestion.  Increase fluid intake, rest. May use Afrin nasal spray (or generic oxymetazoline) twice daily for about 5 days.  Also recommend using saline nasal spray several times daily and/or saline nasal irrigation. Stop all antihistamines for now, and other non-prescription cough/cold preparations. Follow-up with family doctor if not improving 7 days.       Donna Christen, MD 03/10/11 [phone]

## 2011-03-10 NOTE — ED Notes (Signed)
Pt c/o productive cough, hoarseness, fatigue and low grade fever (yesterday 99.6). She has taken Mucinex DM, chloraseptic spray, and allergy relief PE

## 2011-03-11 ENCOUNTER — Other Ambulatory Visit: Payer: Self-pay | Admitting: Family Medicine

## 2011-03-23 ENCOUNTER — Other Ambulatory Visit: Payer: Self-pay | Admitting: *Deleted

## 2011-03-23 MED ORDER — LISINOPRIL 10 MG PO TABS: 20.0000 mg | ORAL_TABLET | Freq: Every day | ORAL | Status: DC

## 2011-03-24 ENCOUNTER — Other Ambulatory Visit: Payer: Self-pay | Admitting: Family Medicine

## 2011-03-28 ENCOUNTER — Ambulatory Visit
Admission: RE | Admit: 2011-03-28 | Discharge: 2011-03-28 | Disposition: A | Discharge status: Home / Self Care | Payer: Managed Care, Other (non HMO) | Source: Ambulatory Visit | Attending: Family Medicine | Admitting: Family Medicine

## 2011-03-28 ENCOUNTER — Encounter: Payer: Self-pay | Admitting: Family Medicine

## 2011-03-28 ENCOUNTER — Ambulatory Visit (INDEPENDENT_AMBULATORY_CARE_PROVIDER_SITE_OTHER): Payer: Managed Care, Other (non HMO) | Admitting: Family Medicine

## 2011-03-28 DIAGNOSIS — J40 Bronchitis, not specified as acute or chronic: Secondary | ICD-10-CM

## 2011-03-28 DIAGNOSIS — Z1211 Encounter for screening for malignant neoplasm of colon: Secondary | ICD-10-CM

## 2011-03-28 DIAGNOSIS — R059 Cough, unspecified: Secondary | ICD-10-CM

## 2011-03-28 DIAGNOSIS — F17200 Nicotine dependence, unspecified, uncomplicated: Secondary | ICD-10-CM

## 2011-03-28 DIAGNOSIS — Z720 Tobacco use: Secondary | ICD-10-CM

## 2011-03-28 DIAGNOSIS — R05 Cough: Secondary | ICD-10-CM

## 2011-03-28 MED ORDER — ALBUTEROL SULFATE (2.5 MG/3ML) 0.083% IN NEBU
2.5000 mg | INHALATION_SOLUTION | Freq: Four times a day (QID) | RESPIRATORY_TRACT | Status: DC | PRN
  Administered 2011-03-28: 2.5 mg via RESPIRATORY_TRACT

## 2011-03-28 MED ORDER — ALBUTEROL SULFATE HFA 108 (90 BASE) MCG/ACT IN AERS: 2.0000 | INHALATION_SPRAY | Freq: Four times a day (QID) | RESPIRATORY_TRACT | Status: DC | PRN

## 2011-03-28 MED ORDER — METHYLPREDNISOLONE ACETATE 80 MG/ML IJ SUSP
80.0000 mg | Freq: Once | INTRAMUSCULAR | Status: AC
  Administered 2011-03-28: 80 mg via INTRAMUSCULAR

## 2011-03-28 NOTE — Patient Instructions (Signed)
We will call you with the chest xray results.  

## 2011-03-28 NOTE — Progress Notes (Signed)
  Subjective:    Patient ID: Kelly Roth, female    DOB: 1950/06/27, 61 y.o.   MRN: [id number]  HPI Kelly Roth to UC about 2 weeks ago and given bronchitis and put on ABX. Got some better and then over the weekend cough got worse and worse. Cough is not longer productive. Now just fees really weak.  No fever.  Still some cough.  No facila pain or pressure. Was coughing so hard felt like dry heaves. No SOB.  Still smoking 1ppd, has cut back while sick. + smoker.    Review of Systems     Objective:   Physical Exam  Constitutional: She is oriented to person, place, and time. She appears well-developed and well-nourished.  HENT:  Head: Normocephalic and atraumatic.  Right Ear: External ear normal.  Left Ear: External ear normal.  Nose: Nose normal.  Mouth/Throat: Oropharynx is clear and moist.       TMs and canals are clear.   Eyes: Conjunctivae and EOM are normal. Pupils are equal, round, and reactive to light.  Cardiovascular: Normal rate, regular rhythm and normal heart sounds.   Pulmonary/Chest: Effort normal. No respiratory distress. She has no wheezes. She exhibits no tenderness.       DEc BS bilat.   Neurological: She is alert and oriented to person, place, and time.  Skin: Skin is warm and dry.  Psychiatric: She has a normal mood and affect. Her behavior is normal.          Assessment & Plan:  Bronchitis-her symptoms improved initially but now she feels worse. She denies feeling specifically short of breath but during conversation she does seem winded. Her pulse ox was down to 96%. She still very heavy one pack-a-day smoker. I did do a peak flow on her x3 today. Her best was 250 which puts her in the yellow zone based on her height.Given Abluterol neb x 1.  Repeat peak flow was still in the yellow zone. We gave her Depo-Medrol IM injection today. I wrote a prescription for an inhaler for her to pick up at the pharmacy. 2 puffs 3 times a day. I will also get a chest x-ray and  call her with the results tomorrow. I make sure she doesn't have a secondary bacterial pneumonia since she has been getting worse the last couple of days. She also seems very short of breath with her cough syrup with the inhaler will help as well.  I reminded her she is due for screening colonoscopy today. We will have been the referral. She had her previous done at [facility].

## 2011-04-26 ENCOUNTER — Ambulatory Visit (INDEPENDENT_AMBULATORY_CARE_PROVIDER_SITE_OTHER): Payer: Managed Care, Other (non HMO) | Admitting: Family Medicine

## 2011-04-26 ENCOUNTER — Encounter: Payer: Self-pay | Admitting: Family Medicine

## 2011-04-26 VITALS — BP 140/75 | HR 68 | Wt 265.0 lb

## 2011-04-26 DIAGNOSIS — M25473 Effusion, unspecified ankle: Secondary | ICD-10-CM

## 2011-04-26 DIAGNOSIS — E669 Obesity, unspecified: Secondary | ICD-10-CM

## 2011-04-26 DIAGNOSIS — B351 Tinea unguium: Secondary | ICD-10-CM

## 2011-04-26 DIAGNOSIS — Z23 Encounter for immunization: Secondary | ICD-10-CM

## 2011-04-26 DIAGNOSIS — E785 Hyperlipidemia, unspecified: Secondary | ICD-10-CM

## 2011-04-26 DIAGNOSIS — M25472 Effusion, left ankle: Secondary | ICD-10-CM

## 2011-04-26 DIAGNOSIS — I10 Essential (primary) hypertension: Secondary | ICD-10-CM

## 2011-04-26 MED ORDER — LISINOPRIL-HYDROCHLOROTHIAZIDE 10-12.5 MG PO TABS: 1.0000 | ORAL_TABLET | Freq: Every day | ORAL | Status: DC

## 2011-04-26 NOTE — Progress Notes (Signed)
  Subjective:    Patient ID: Kelly Roth, female    DOB: 08-20-1950, 61 y.o.   MRN: [id number]  HPI Ankle swelling f/u -Stopped her antiinflammatory. Says swelling is coming and going. Has been wearing her compression stockings at work.  Had a burning sensation on the front of her leg this AM but just lasted a second. Only swells on the left. No old injuries.    Hypertension-no chest pain or short of breath. She's been taking her medications regularly. She does not necessarily follow a low-fat diet but she says she has not add  She has very thick thumb nails. She wonders if that could be a nail fungus. She would like [place] to send a culture possible. Review of Systems     Objective:   Physical Exam  Constitutional: She is oriented to person, place, and time. She appears well-developed and well-nourished.  HENT:  Head: Normocephalic and atraumatic.  Cardiovascular: Normal rate, regular rhythm and normal heart sounds.   Pulmonary/Chest: Effort normal and breath sounds normal.  Musculoskeletal:       1+ left ankle edema.   Neurological: She is alert and oriented to person, place, and time.  Skin: Skin is warm and dry.       Bilateral dystrophic thumb nails, thick and yellow.   Psychiatric: She has a normal mood and affect. Her behavior is normal.          Assessment & Plan:  Ankle swelling. We discussed low salt diet. Continue her compression stockings while at work. We will also add HCTZ 20 mg and this may help control her swelling. She is very had a Doppler to rule out DVT. If we can her blood pressure under control will help as well.  Nail dystrophy - will clip thumb nail and send for culture  HTN - Not well controlled. Add HCTZ 12.5mg  and f/u in 5-6 weeks.  Work on low salt diet.   Pneumonia vaccine given.

## 2011-04-27 ENCOUNTER — Other Ambulatory Visit: Payer: Self-pay | Admitting: Family Medicine

## 2011-05-03 ENCOUNTER — Other Ambulatory Visit: Payer: Self-pay | Admitting: Family Medicine

## 2011-05-03 LAB — COMPLETE METABOLIC PANEL WITH GFR
Albumin: 4.2 g/dL (ref 3.5–5.2)
Alkaline Phosphatase: 83 U/L (ref 39–117)
BUN: 17 mg/dL (ref 6–23)
CO2: 25 mEq/L (ref 19–32)
Chloride: 103 mEq/L (ref 96–112)
Creat: 0.72 mg/dL (ref 0.50–1.10)
GFR, Est African American: >89 mL/min
GFR, Est Non African American: >89 mL/min
Glucose, Bld: 111 mg/dL — ABNORMAL HIGH (ref 70–99)
Potassium: 4 mEq/L (ref 3.5–5.3)
Sodium: 140 mEq/L (ref 135–145)
Total Bilirubin: 0.5 mg/dL (ref 0.3–1.2)

## 2011-05-03 LAB — LIPID PANEL
Cholesterol: 188 mg/dL (ref 0–200)
HDL: 46 mg/dL (ref >39)
Total CHOL/HDL Ratio: 4.1 Ratio
Triglycerides: 106 mg/dL (ref <150)

## 2011-05-03 MED ORDER — PITAVASTATIN CALCIUM 4 MG PO TABS: 4.0000 mg | ORAL_TABLET | Freq: Every day | ORAL | Status: DC

## 2011-05-17 ENCOUNTER — Ambulatory Visit (INDEPENDENT_AMBULATORY_CARE_PROVIDER_SITE_OTHER): Payer: Managed Care, Other (non HMO) | Admitting: Family Medicine

## 2011-05-17 ENCOUNTER — Encounter: Payer: Self-pay | Admitting: Family Medicine

## 2011-05-17 VITALS — BP 140/81 | HR 51 | Wt 265.0 lb

## 2011-05-17 DIAGNOSIS — I10 Essential (primary) hypertension: Secondary | ICD-10-CM

## 2011-05-17 DIAGNOSIS — E88819 Insulin resistance, unspecified: Secondary | ICD-10-CM | POA: Insufficient documentation

## 2011-05-17 DIAGNOSIS — E8881 Metabolic syndrome: Secondary | ICD-10-CM | POA: Insufficient documentation

## 2011-05-17 DIAGNOSIS — R7309 Other abnormal glucose: Secondary | ICD-10-CM

## 2011-05-17 MED ORDER — LISINOPRIL-HYDROCHLOROTHIAZIDE 20-12.5 MG PO TABS: 1.0000 | ORAL_TABLET | Freq: Every day | ORAL | Status: DC

## 2011-05-17 NOTE — Patient Instructions (Signed)
1800 Calorie Diabetic Diet The 1800 calorie diabetic diet is designed for eating up to 1800 calories each day. Following this diet and making healthy meal choices can help improve overall health. It controls blood glucose (sugar) levels, and it can also help lower blood pressure and cholesterol. SERVING SIZES Measuring foods and serving sizes helps to make sure you are getting the right amount of food. The list below tells how big or small some common serving sizes are:  1 oz.........4 stacked dice.   3 oz........Marland KitchenDeck of cards.   1 tsp.......Marland KitchenTip of little finger.   1 tbs......Marland KitchenMarland KitchenThumb.   2 tbs.......Marland KitchenGolf ball.    cup......Marland KitchenHalf of a fist.   1 cup.......Marland KitchenA fist.  GUIDELINES FOR CHOOSING FOODS The goal of this diet is to eat a variety of foods and limit calories to 1800 each day. This can be done by choosing foods that are low in calories and fat. The diet also suggests eating small amounts of food frequently. Doing this helps control your blood glucose levels so they do not get too high or too low. Each meal or snack may include a protein food source to help you feel more satisfied. Try to eat about the same amount of food around the same time each day. This includes weekend days, travel days, and days off work. Space your meals about 4 to 5 hours apart, and add a snack between them, if you wish.   For example, a daily food plan could include breakfast, a morning snack, lunch, dinner, and an evening snack. Healthy meals and snacks have different types of foods, including whole grains, vegetables, fruits, lean meats, poultry, fish, and dairy products. As you plan your meals, select a variety of foods. Choose from the bread and starch, vegetable, fruit, dairy, and meat/protein groups. Examples of foods from each group are listed below with their suggested serving sizes. Use measuring cups and spoons to become familiar with what a healthy portion looks like. Bread and Starch Each serving equals  15 grams of carbohydrates.  1 slice bread.    bagel.    cup cold cereal (unsweetened).    cup hot cereal or mashed potatoes.   1 small potato (size of a computer mouse).   ? cup cooked pasta or rice.    English muffin.   1 cup broth-based soup.   3 cups of popcorn.   4 to 6 whole-wheat crackers.    cup cooked beans, peas, or corn.  Vegetables Each serving equals 5 grams of carbohydrates.   cup cooked vegetables.   1 cup raw vegetables.    cup tomato or vegetable juice.  Fruit Each serving equals 15 grams of carbohydrates.  1 small apple or orange.   1  cup watermelon or strawberries.    cup applesauce (no sugar added).   2 tbs raisins.    banana.    cup canned fruit, packed in water or in its own juice.    cup unsweetened fruit juice.  Dairy Each serving equals 12 to 15 grams of carbohydrates.  1 cup fat-free milk.   6 oz artificially sweetened yogurt or plain yogurt.   1 cup low-fat buttermilk.   1 cup soy milk.   1 cup almond milk.  Meat/Protein  1 large egg.   2 to 3 oz meat, poultry, or fish.    cup low-fat cottage cheese.   1 tbs peanut butter.   1 oz low-fat cheese.    cup tuna, packed in water.  cup tofu.  Fat  1 tsp oil.   1 tsp trans-fat-free margarine.   1 tsp butter.   1 tsp mayonnaise.   2 tbs avocado.   1 tbs salad dressing.   1 tbs cream cheese.   2 tbs sour cream.  SAMPLE 1800 CALORIE DIET PLAN Breakfast   cup unsweetened cereal (1 carb serving).   1 cup fat-free milk (1 carb serving).   1 slice whole-wheat toast (1 carb serving).    small banana (1 carb serving).   1 scrambled egg.   1 tsp trans-fat-free margarine.  Lunch  Tuna sandwich.   2 slices whole-wheat bread (2 carb servings).    cup canned tuna in water, drained.   1 tbs reduced fat mayonnaise.   1 stalk celery, chopped.   2 slices tomato.   1 lettuce leaf.   1 cup carrot sticks.   24 to 30 seedless  grapes (2 carb servings).   6 oz light yogurt (1 carb serving).  Afternoon Snack  3 graham cracker squares (1 carb serving).   1 cup fat-free milk (1 carb serving).   1 tbs peanut butter.  Dinner  3 oz salmon, broiled with 1 tsp oil.   1 cup mashed potatoes (2 carb servings) with 1 tsp trans-fat-free margarine.   1 cup fresh or frozen green beans.   1 cup steamed asparagus.   1 cup fat-free milk (1 carb serving).  Evening Snack  3 cups of air-popped popcorn (1 carb serving).   2 tbs Parmesan cheese.  Meal Plan You can use this worksheet to help you make a daily meal plan based on the 1800 calorie diabetic diet suggestions. If you are using this plan to help you control your blood glucose, you may interchange carbohydrate-containing foods (dairy, starches, and fruits). Select a variety of fresh foods of varying colors and flavors. The total amount of carbohydrate in your meals or snacks is more important than making sure you include all of the food groups every time you eat. Choose from the approximate amount of the following foods to build your day's meals:  8 Starches.   4 Vegetables.   3 Fruits.   2 Dairy.   6 to 7 oz Meat/Protein.   Up to 4 Fats.  Your dietician can use this worksheet to help you decide how many servings and which types of foods are right for you. BREAKFAST Food Group and Servings / Food Choice Starches _______________________________________________________ Dairy __________________________________________________________ Fruit ___________________________________________________________ Meat/Protein ____________________________________________________ Fat ____________________________________________________________ LUNCH Food Group and Servings / Food Choice Starch _________________________________________________________ Meat/Protein ___________________________________________________ Vegetables  _____________________________________________________ Fruit __________________________________________________________ Dairy __________________________________________________________ Fat ____________________________________________________________ Kelly Roth Food Group and Servings / Food Choice Starch ________________________________________________________ Meat/Protein ___________________________________________________ Fruit __________________________________________________________ Dairy __________________________________________________________ Kelly Roth Food Group and Servings / Food Choice Starches _______________________________________________________ Meat/Protein ___________________________________________________ Dairy __________________________________________________________ Vegetable ______________________________________________________ Fruit ___________________________________________________________ Fat ____________________________________________________________ Kelly Roth Food Group and Servings / Food Choice Fruit __________________________________________________________ Meat/Protein ___________________________________________________ Dairy __________________________________________________________ Starch _________________________________________________________ DAILY TOTALS Starches _________________________ Vegetables _______________________ Fruits ____________________________ Dairy ____________________________ Meat/Protein_____________________ Fats _____________________________ Document Released: 09/20/2004 Document Revised: 02/17/2011 Document Reviewed: 01/14/2011 [employer] Patient Information 2012 [place], [place].Diets for Diabetes, Food Labeling Look at food labels to help you decide how much of a product you can eat. You will want to check the amount of total carbohydrate in a serving to see how the food fits into your meal plan. In the list of  ingredients, the ingredient present in the largest amount by weight must be listed first, followed by the other  ingredients in descending order. STANDARD OF IDENTITY Most products have a list of ingredients. However, foods that the Food and Drug Administration (FDA) has given a standard of identity do not need a list of ingredients. A standard of identity means that a food must contain certain ingredients if it is called a particular name. Examples are mayonnaise, peanut butter, ketchup, jelly, and cheese. LABELING TERMS There are many terms found on food labels. Some of these terms have specific definitions. Some terms are regulated by the FDA, and the FDA has clearly specified how they can be used. Others are not regulated or well-defined and can be misleading and confusing. SPECIFICALLY DEFINED TERMS Nutritive Sweetener.  A sweetener that contains calories,such as table sugar or honey.  Nonnutritive Sweetener.  A sweetener with few or no calories,such as saccharin, aspartame, sucralose, and cyclamate.  LABELING TERMS REGULATED BY THE FDA Free.  The product contains only a tiny or small amount of fat, cholesterol, sodium, sugar, or calories. For example, a "fat-free" product will contain less than 0.5 g of fat per serving.  Low.  A food described as "low" in fat, saturated fat, cholesterol, sodium, or calories could be eaten fairly often without exceeding dietary guidelines. For example, "low in fat" means no more than 3 g of fat per serving.  Lean.  "Lean" and "extra lean" are U.S. Department of Agriculture Architect) terms for use on meat and poultry products. "Lean" means the product contains less than 10 g of fat, 4 g of saturated fat, and 95 mg of cholesterol per serving. "Lean" is not as low in fat as a product labeled "low."  Extra Lean.  "Extra lean" means the product contains less than 5 g of fat, 2 g of saturated fat, and 95 mg of cholesterol per serving. While "extra lean" has less  fat than "lean," it is still higher in fat than a product labeled "low."  Reduced, Less, Fewer.  A diet product that contains 25% less of a nutrient or calories than the regular version. For example, hot dogs might be labeled "25% less fat than our regular hot dogs."  Light/Lite.  A diet product that contains ? fewer calories or  the fat of the original. For example, "light in sodium" means a product with  the usual sodium.  More.  One serving contains at least 10% more of the daily value of a vitamin, mineral, or fiber than usual.  Good Source Of.  One serving contains 10% to 19% of the daily value for a particular vitamin, mineral, or fiber.  Excellent Source Of.  One serving contains 20% or more of the daily value for a particular nutrient. Other terms used might be "high in" or "rich in."  Enriched or Fortified.  The product contains added vitamins, minerals, or protein. Nutrition labeling must be used on enriched or fortified foods.  Imitation.  The product has been altered so that it is lower in protein, vitamins, or minerals than the usual food,such as imitation peanut butter.  Total Fat.  The number listed is the total of all fat found in a serving of the product. Under total fat, food labels must list saturated fat and trans fat, which are associated with raising bad cholesterol and an increased risk of heart blood vessel disease.  Saturated Fat.  Mainly fats from animal-based sources. Some examples are red meat, cheese, cream, whole milk, and coconut oil.  Trans Fat.  Found in some fried snack foods, packaged foods, and fried  restaurant foods. It is recommended you eat as close to 0 g of trans fat as possible, since it raises bad cholesterol and lowers good cholesterol.  Polyunsaturated and Monounsaturated Fats.  More healthful fats. These fats are from plant sources.  Total Carbohydrate.  The number of carbohydrate grams in a serving of the product. Under total  carbohydrate are listed the other carbohydrate sources, such as dietary fiber and sugars.  Dietary Fiber.  A carbohydrate from plant sources.  Sugars.  Sugars listed on the label contain all naturally occurring sugars as well as added sugars.  LABELING TERMS NOT REGULATED BY THE FDA Sugarless.  Table sugar (sucrose) has not been added. However, the manufacturer may use another form of sugar in place of sucrose to sweeten the product. For example, sugar alcohols are used to sweeten foods. Sugar alcohols are a form of sugar but are not table sugar. If a product contains sugar alcohols in place of sucrose, it can still be labeled "sugarless."  Low Salt, Salt-Free, Unsalted, No Salt, No Salt Added, Without Added Salt.  Food that is usually processed with salt has been made without salt. However, the food may contain sodium-containing additives, such as preservatives, leavening agents, or flavorings.  Natural.  This term has no legal meaning.  Organic.  Foods that are certified as organic have been inspected and approved by the USDA to ensure they are produced without pesticides, fertilizers containing synthetic ingredients, bioengineering, or ionizing radiation.  Document Released: 03/03/2003 Document Revised: 02/17/2011 Document Reviewed: 09/18/2008 [facility] Patient Information 2012 [place], [place].

## 2011-05-17 NOTE — Progress Notes (Signed)
  Subjective:    Patient ID: Kelly Roth, female    DOB: 1950-03-19, 61 y.o.   MRN: [id number]  HPI Here ot f/u on abnormal glucose.  No shakes.  No inc thrist or urination. No numbness or tingling in the feet. Glucse was abnormal on recent labs. They were unable to add A1C to labs.   HTN - tolertaing HCTZ well without S.E.    Hyperlipidemia-I did send in a prescription for the 4 mg tablet. She wants a new coupon card if have one.    Review of Systems     Objective:   Physical Exam  Constitutional: She appears well-developed and well-nourished.  HENT:  Head: Normocephalic and atraumatic.  Skin: Skin is warm and dry.  Psychiatric: She has a normal mood and affect. Her behavior is normal.          Assessment & Plan:  Insulin resistance- Discussed working on weight loss, exercise and dietary changes. REviewd meal planning, givne H.O. And discussed portion sizes.  Recheck A1C in 4 months. I think with some changes she could reverse this.   HTN - Not well controlled. Will inc her lisinopril component of her lisinopril hct.  F/U in 4 months.    Hyperlipidemia - Givne new coupon card.  Recheck lab in July.

## 2011-05-24 ENCOUNTER — Other Ambulatory Visit: Payer: Self-pay | Admitting: Family Medicine

## 2011-05-25 ENCOUNTER — Other Ambulatory Visit: Payer: Self-pay | Admitting: *Deleted

## 2011-05-26 ENCOUNTER — Other Ambulatory Visit: Payer: Self-pay | Admitting: Family Medicine

## 2011-05-26 ENCOUNTER — Other Ambulatory Visit: Payer: Self-pay | Admitting: *Deleted

## 2011-05-26 MED ORDER — MELOXICAM 7.5 MG PO TABS: 7.5000 mg | ORAL_TABLET | Freq: Every day | ORAL | Status: DC

## 2011-05-31 ENCOUNTER — Ambulatory Visit: Payer: Managed Care, Other (non HMO) | Admitting: Family Medicine

## 2011-06-08 ENCOUNTER — Telehealth: Payer: Self-pay | Admitting: *Deleted

## 2011-06-08 NOTE — Telephone Encounter (Signed)
No. 20mg  per day is the max dose.  She can use Tylenol arthrits in the middle of the day to help cover for pain relief in between doses.

## 2011-06-08 NOTE — Telephone Encounter (Signed)
Pt informed

## 2011-06-08 NOTE — Telephone Encounter (Signed)
Pt states that she is taking the Piroxicam 10mg  BID and has tried it TID b/c of the arthritis pain. States taking 3 a day helps and would like to know if you will increase her from BID to TID. Please advise.

## 2011-06-15 ENCOUNTER — Ambulatory Visit (INDEPENDENT_AMBULATORY_CARE_PROVIDER_SITE_OTHER): Payer: Managed Care, Other (non HMO) | Admitting: Physician Assistant

## 2011-06-15 ENCOUNTER — Encounter: Payer: Self-pay | Admitting: Physician Assistant

## 2011-06-15 ENCOUNTER — Other Ambulatory Visit: Payer: Self-pay | Admitting: Family Medicine

## 2011-06-15 VITALS — BP 111/60 | HR 90 | Temp 98.1°F | Ht 64.0 in | Wt 270.0 lb

## 2011-06-15 DIAGNOSIS — R600 Localized edema: Secondary | ICD-10-CM

## 2011-06-15 DIAGNOSIS — R609 Edema, unspecified: Secondary | ICD-10-CM

## 2011-06-15 LAB — COMPLETE METABOLIC PANEL WITH GFR
Albumin: 4.5 g/dL (ref 3.5–5.2)
Alkaline Phosphatase: 91 U/L (ref 39–117)
BUN: 11 mg/dL (ref 6–23)
Calcium: 9.6 mg/dL (ref 8.4–10.5)
Chloride: 104 mEq/L (ref 96–112)
GFR, Est Non African American: >89 mL/min
Glucose, Bld: 82 mg/dL (ref 70–99)
Potassium: 4.2 mEq/L (ref 3.5–5.3)
Sodium: 139 mEq/L (ref 135–145)
Total Protein: 6.9 g/dL (ref 6.0–8.3)

## 2011-06-15 MED ORDER — TERBINAFINE HCL 250 MG PO TABS: 250.0000 mg | ORAL_TABLET | Freq: Every day | ORAL | Status: DC

## 2011-06-15 MED ORDER — FUROSEMIDE 40 MG PO TABS: 40.0000 mg | ORAL_TABLET | Freq: Every day | ORAL | Status: DC

## 2011-06-15 NOTE — Progress Notes (Signed)
  Subjective:    Patient ID: Kelly Roth, female    DOB: August 16, 1950, 61 y.o.   MRN: [id number]  HPI Patient presents to clinic with swollen right leg. 2 months ago she had the same thing happen to her left leg. She had a [place] and there was no blood cloth. It went down within one week with rest, elevation, and compression stockings. 3 days ago her right ankle and leg started to swell. She has had no trauma that she knows of. She denies any pain. She has not tried anyhting for the swelling. She does smoke a pack a day. She has had no recent surgery.    Review of Systems     Objective:   Physical Exam  Constitutional: She is oriented to person, place, and time. She appears well-developed and well-nourished.       Obese  Cardiovascular: Normal rate, regular rhythm and normal heart sounds.   Pulmonary/Chest: Effort normal and breath sounds normal. She has no wheezes.  Musculoskeletal:       Right leg presents with 1+ pitting edema up to mid calf. No color changes to leg or warmth. Skin is in good condition without ulcers or open sores. Toes where normal color. Pedal pulse 2+ bilaterally. Strength 5/5 of both legs, feet.  Neurological: She is alert and oriented to person, place, and time. She has normal reflexes.  Psychiatric: She has a normal mood and affect. Her behavior is normal.          Assessment & Plan:  Right leg edema-Start using compression stockings while swelling is present. Start taking lasix as needed for swelling up to 3-4 days if still present then call office. Get d-dimer to rule out blood clot and we will call with results. Elevated legs as much as possible and stay off of them. Follow up in 1 week.

## 2011-06-15 NOTE — Patient Instructions (Addendum)
Start using compression stockings while swelling is present. Start taking lasix as needed for swelling up to 3-4 days if still present then call office. Get blood work and we will call with results. Elevated legs as much as possible and stay off of them. Follow up in 1 week.

## 2011-06-16 ENCOUNTER — Telehealth: Payer: Self-pay | Admitting: *Deleted

## 2011-06-16 ENCOUNTER — Encounter: Payer: Self-pay | Admitting: Family Medicine

## 2011-06-16 DIAGNOSIS — B351 Tinea unguium: Secondary | ICD-10-CM | POA: Insufficient documentation

## 2011-06-16 NOTE — Telephone Encounter (Signed)
Called to get preauth. Informed they would fax paper within 24hrs stating either approval or denial.

## 2011-06-16 NOTE — Telephone Encounter (Signed)
Dr. Eppie Gibson saw a few weeks ago a dx of onychomycosis of thumb was given then. That is what needs to be used. I did not see her for this.

## 2011-06-16 NOTE — Telephone Encounter (Signed)
I'm trying to get the preauth for this pt for the Terbinafine HCL 250mg  tablet and I don't see any diagnosis code except for leg edema. Can I use this for the diagnosis for the med or what other one are you going to put in your note. Please let me know.

## 2011-06-24 ENCOUNTER — Other Ambulatory Visit: Payer: Self-pay | Admitting: Family Medicine

## 2011-06-24 ENCOUNTER — Telehealth: Payer: Self-pay | Admitting: *Deleted

## 2011-06-24 ENCOUNTER — Other Ambulatory Visit: Payer: Self-pay | Admitting: Physician Assistant

## 2011-06-24 DIAGNOSIS — Z79899 Other long term (current) drug therapy: Secondary | ICD-10-CM

## 2011-06-25 LAB — COMPLETE METABOLIC PANEL WITH GFR
ALT: 21 U/L (ref 0–35)
Alkaline Phosphatase: 80 U/L (ref 39–117)
CO2: 26 mEq/L (ref 19–32)
Creat: 0.79 mg/dL (ref 0.50–1.10)
GFR, Est African American: >89 mL/min
GFR, Est Non African American: 83 mL/min
Glucose, Bld: 100 mg/dL — ABNORMAL HIGH (ref 70–99)
Sodium: 138 mEq/L (ref 135–145)
Total Bilirubin: 0.7 mg/dL (ref 0.3–1.2)
Total Protein: 6.2 g/dL (ref 6.0–8.3)

## 2011-06-29 ENCOUNTER — Encounter: Payer: Self-pay | Admitting: Physician Assistant

## 2011-06-29 ENCOUNTER — Ambulatory Visit (INDEPENDENT_AMBULATORY_CARE_PROVIDER_SITE_OTHER): Payer: Managed Care, Other (non HMO) | Admitting: Physician Assistant

## 2011-06-29 VITALS — BP 107/55 | HR 87 | Ht 64.0 in | Wt 269.0 lb

## 2011-06-29 DIAGNOSIS — I872 Venous insufficiency (chronic) (peripheral): Secondary | ICD-10-CM

## 2011-06-29 DIAGNOSIS — R609 Edema, unspecified: Secondary | ICD-10-CM

## 2011-06-29 DIAGNOSIS — I878 Other specified disorders of veins: Secondary | ICD-10-CM

## 2011-06-29 DIAGNOSIS — R600 Localized edema: Secondary | ICD-10-CM

## 2011-06-29 MED ORDER — FUROSEMIDE 40 MG PO TABS: 40.0000 mg | ORAL_TABLET | Freq: Every day | ORAL | Status: DC

## 2011-06-29 NOTE — Progress Notes (Addendum)
  Subjective:    Patient ID: Kelly Roth, female    DOB: [birth year], 61 y.o.   MRN: [id number]  HPI Patient presents to the clinic because she continues to swell in both of her legs. She reports no other swelling anywhere else. This problem has been ongoing for the last 6 months. It is worse after she has been on her feet all day. The swelling is better when she wears her compression stockings. She has not worn them in a few days. She also has been taking lasix off and on but she is currently out. She doesn't have any pain assoicated with swelling. No chest pains, SOB, wheezing, claudication.   Review of Systems     Objective:   Physical Exam  Constitutional: She is oriented to person, place, and time. She appears well-developed and well-nourished.  HENT:  Head: Normocephalic and atraumatic.  Cardiovascular: Normal rate, regular rhythm and normal heart sounds.   Pulmonary/Chest: Effort normal and breath sounds normal. She has no wheezes.  Neurological: She is alert and oriented to person, place, and time.  Skin: Skin is warm and dry.       1+ pitting edema Bilaterally. No skin deformity or ulceration.   Psychiatric: She has a normal mood and affect. Her behavior is normal.          Assessment & Plan:  Bilateral leg edema/Venous statis- Continue with Lasix everyday for the next 3 weeks and then take only as needed for swelling. Continue to wear compression stockings daily. Try to stay off of your feet as much as you can during the day and then elevate them at night. Gave handout about venous statis. Mobic could be causing some of swelling educated patient on this ;however, she states that this really helps her pain and does not want to stop taking.

## 2011-06-29 NOTE — Patient Instructions (Addendum)
Start Lasix everyday for 3 weeks then switch to only using as needed after that. Continue using compression stockings.  Venous Stasis and Chronic Venous Insufficiency As people age, the veins located in their legs may weaken and stretch. When veins weaken and lose the ability to pump blood effectively, the condition is called chronic venous insufficiency (CVI) or venous stasis. Almost all veins return blood back to the heart. This happens by:  The force of the heart pumping fresh blood pushes blood back to the heart.   Blood flowing to the heart from the force of gravity.  In the deep veins of the legs, blood has to fight gravity and flow upstream back to the heart. Here, the leg muscles contract to pump blood back toward the heart. Vein walls are elastic, and many veins have small valves that only allow blood to flow in one direction. When leg muscles contract, they push inward against the elastic vein walls. This squeezes blood upward, opens the valves, and moves blood toward the heart. When leg muscles relax, the vein wall also relaxes and the valves inside the vein close to prevent blood from flowing backward. This method of pumping blood out of the legs is called the venous pump. CAUSES  The venous pump works best while walking and leg muscles are contracting. But when a person sits or stands, blood pressure in leg veins can build. Deep veins are usually able to withstand short periods of inactivity, but long periods of inactivity (and increased pressure) can stretch, weaken, and damage vein walls. High blood pressure can also stretch and damage vein walls. The veins may no longer be able to pump blood back to the heart. Venous hypertension (high blood pressure inside veins) that lasts over time is a primary cause of CVI. CVI can also be caused by:   Deep vein thrombosis, a condition where a thrombus (blood clot) blocks blood flow in a vein.   Phlebitis, an inflammation of a superficial vein that  causes a blood clot to form.  Other risk factors for CVI may include:   Heredity.   Obesity.   Pregnancy.   Sedentary lifestyle.   Smoking.   Jobs requiring long periods of standing or sitting in one place.   Age and gender:   Women in their 18's and 88's and men in their 22's are more prone to developing CVI.  SYMPTOMS  Symptoms of CVI may include:   Varicose veins.   Ulceration or skin breakdown.   Lipodermatosclerosis, a condition that affects the skin just above the ankle, usually on the inside surface. Over time the skin becomes brown, smooth, tight and often painful. Those with this condition have a high risk of developing skin ulcers.   Reddened or discolored skin on the leg.   Swelling.  DIAGNOSIS  Your caregiver can diagnose CVI after performing a careful medical history and physical examination. To confirm the diagnosis, the following tests may also be ordered:   Duplex ultrasound.   Plethysmography (tests blood flow).   Venograms (x-ray using a special dye).  TREATMENT The goals of treatment for CVI are to restore a person to an active life and to minimize pain or disability. Typically, CVI does not pose a serious threat to life or limb, and with proper treatment most people with this condition can continue to lead active lives. In most cases, mild CVI can be treated on an outpatient basis with simple procedures. Treatment methods include:   Elastic compression socks.  Sclerotherapy, a procedure involving an injection of a material that "dissolves" the damaged veins. Other veins in the network of blood vessels take over the function of the damaged veins.   Vein stripping (an older procedure less commonly used).   Laser Ablation surgery.   Valve repair.  HOME CARE INSTRUCTIONS   Elastic compression socks must be worn every day. They can help with symptoms and lower the chances of the problem getting worse, but they do not cure the problem.   Only take  over-the-counter or prescription medicines for pain, discomfort, or fever as directed by your caregiver.   Your caregiver will review your other medications with you.  SEEK MEDICAL CARE IF:   You are confused about how to take your medications.   There is redness, swelling, or increasing pain in the affected area.   There is a red streak or line that extends up or down from the affected area.   There is a breakdown or loss of skin in the affected area, even if the breakdown is small.   You develop an unexplained oral temperature above 102 F (38.9 C).   There is an injury to the affected area.  SEEK IMMEDIATE MEDICAL CARE IF:   There is an injury and open wound to the affected area.   Pain is not adequately relieved with pain medication prescribed or becomes severe.   An oral temperature above 102 F (38.9 C) develops.   The foot/ankle below the affected area becomes suddenly numb or the area feels weak and hard to move.  MAKE SURE YOU:   Understand these instructions.   Will watch your condition.   Will get help right away if you are not doing well or get worse.  Document Released: 07/04/2006 Document Revised: 02/17/2011 Document Reviewed: 09/11/2006 [facility] Patient Information 2012 [place], [place].

## 2011-08-05 ENCOUNTER — Other Ambulatory Visit: Payer: Self-pay | Admitting: Family Medicine

## 2011-08-29 ENCOUNTER — Encounter: Payer: Self-pay | Admitting: Family Medicine

## 2011-08-30 ENCOUNTER — Telehealth: Payer: Self-pay | Admitting: *Deleted

## 2011-08-30 NOTE — Telephone Encounter (Signed)
We received a request for PA for Terbinafine for the pt. When I called they stated that it had been denied. Per pharmacy pt did fill med in April and May. Is there something different you would like to send in? Please advise.

## 2011-08-30 NOTE — Telephone Encounter (Signed)
She will have to pay out of pocket for the 3rd month.

## 2011-08-31 NOTE — Telephone Encounter (Signed)
Pt calls back and notified of med not approved thru insurance. KG LPN

## 2011-08-31 NOTE — Telephone Encounter (Signed)
LM for pt to returncall

## 2011-09-12 ENCOUNTER — Encounter: Payer: Self-pay | Admitting: Family Medicine

## 2011-09-12 ENCOUNTER — Ambulatory Visit (INDEPENDENT_AMBULATORY_CARE_PROVIDER_SITE_OTHER): Payer: Managed Care, Other (non HMO) | Admitting: Family Medicine

## 2011-09-12 VITALS — BP 139/80 | HR 97 | Ht 64.0 in | Wt 273.0 lb

## 2011-09-12 DIAGNOSIS — R7301 Impaired fasting glucose: Secondary | ICD-10-CM

## 2011-09-12 DIAGNOSIS — Z01419 Encounter for gynecological examination (general) (routine) without abnormal findings: Secondary | ICD-10-CM | POA: Insufficient documentation

## 2011-09-12 DIAGNOSIS — Z0000 Encounter for general adult medical examination without abnormal findings: Secondary | ICD-10-CM

## 2011-09-12 DIAGNOSIS — R1011 Right upper quadrant pain: Secondary | ICD-10-CM

## 2011-09-12 DIAGNOSIS — R252 Cramp and spasm: Secondary | ICD-10-CM

## 2011-09-12 DIAGNOSIS — I10 Essential (primary) hypertension: Secondary | ICD-10-CM

## 2011-09-12 NOTE — Patient Instructions (Addendum)

## 2011-09-12 NOTE — Progress Notes (Signed)
Subjective:     Kelly Roth is a 61 y.o. female and is here for a comprehensive physical exam. The patient reports no problems. Still having occ RUQ pain. Says it feels like a cramping but has had her GB removed. Can happen at anytime.  Has had GI work up with no findings in the past.  Says still occ gets cramps in the middle of her feet as well.  Says can not happened for weeks and then all the suden start again. She has been wearing her compression stockings.   History   Social History  . Marital Status: Married    Spouse Name: N/A    Number of Children: N/A  . Years of Education: N/A   Occupational History  . Not on file.   Social History Main Topics  . Smoking status: Current Everyday Smoker -- 1.0 packs/day  . Smokeless tobacco: Never Used  . Alcohol Use: 0.0 oz/week     one drink a week   . Drug Use: No  . Sexually Active: Not on file   Other Topics Concern  . Not on file   Social History Narrative  . No narrative on file   Health Maintenance  Topic Date Due  . Influenza Vaccine  12/13/2011  . Mammogram  03/03/2012  . Pap Smear  05/24/2013  . Tetanus/tdap  05/18/2016  . Colonoscopy  03/14/2017    The following portions of the patient's history were reviewed and updated as appropriate: allergies, current medications, past family history, past medical history, past social history, past surgical history and problem list.  Review of Systems A comprehensive review of systems was negative.   Objective:    BP 139/80  Pulse 97  Ht 5\' 4"  (1.626 m)  Wt 273 lb (123.832 kg)  BMI 46.86 kg/m2 General appearance: alert, cooperative, appears stated age and moderately obese Head: Normocephalic, without obvious abnormality, atraumatic Eyes: conj clear, EOMi PEERLA Ears: normal TM's and external ear canals both ears Nose: Nares normal. Septum midline. Mucosa normal. No drainage or sinus tenderness. Throat: lips, mucosa, and tongue normal; teeth and gums normal Neck: no  adenopathy, no carotid bruit, no JVD, supple, symmetrical, trachea midline and thyroid not enlarged, symmetric, no tenderness/mass/nodules Back: symmetric, no curvature. ROM normal. No CVA tenderness. Lungs: clear to auscultation bilaterally Breasts: normal appearance, no masses or tenderness Heart: regular rate and rhythm, S1, S2 normal, no murmur, click, rub or gallop Abdomen: soft, non-tender; bowel sounds normal; no masses,  no organomegaly Pelvic: cervix normal in appearance, external genitalia normal, no adnexal masses or tenderness, no cervical motion tenderness, rectovaginal septum normal, uterus normal size, shape, and consistency and vagina normal without discharge Extremities: extremities normal, atraumatic, no cyanosis or edema Pulses: 2+ and symmetric Skin: Skin color, texture, turgor normal. No rashes or lesions Lymph nodes: Cervical, supraclavicular, and axillary nodes normal. Neurologic: Alert and oriented X 3, normal strength and tone. Normal symmetric reflexes. Normal coordination and gait    Assessment:    Healthy female exam.      Plan:     See After Visit Summary for Counseling Recommendations  Start a regular exercise program and make sure you are eating a healthy diet Try to eat 4 servings of dairy a day or take a calcium supplement (500mg  twice a day). Your vaccines are up to date.   Hyperlipidemia - Recheck lipids now that she is on the Livalo 4 mg dose.Hopefully it will look better than it did in February. Continue work  on weight loss.  She says she plans on scheduling her mammogram. If she needs any assistance I encouraged her to call the office and we'll be happy to make a referral for her.  Muscle cramps-I'm not sure that this is related to her statin as she can sometimes go several weeks without any symptoms. We will check her electrolytes as well as a CK and make sure that this is normal. It may be also secondary to obesity and inactivity. Consider podiatry  referral for further evaluation.  Obesity-given handout to work on losing weight through exercise.

## 2011-09-13 ENCOUNTER — Other Ambulatory Visit (HOSPITAL_COMMUNITY)
Admission: RE | Admit: 2011-09-13 | Discharge: 2011-09-13 | Disposition: A | Discharge status: Home / Self Care | Payer: Managed Care, Other (non HMO) | Source: Ambulatory Visit | Attending: Family Medicine | Admitting: Family Medicine

## 2011-09-13 LAB — COMPLETE METABOLIC PANEL WITH GFR
Alkaline Phosphatase: 77 U/L (ref 39–117)
CO2: 25 mEq/L (ref 19–32)
Creat: 0.61 mg/dL (ref 0.50–1.10)
GFR, Est African American: >89 mL/min
GFR, Est Non African American: >89 mL/min
Glucose, Bld: 97 mg/dL (ref 70–99)
Sodium: 138 mEq/L (ref 135–145)
Total Bilirubin: 0.7 mg/dL (ref 0.3–1.2)

## 2011-09-13 LAB — MAGNESIUM: Magnesium: 1.7 mg/dL (ref 1.5–2.5)

## 2011-09-13 LAB — HEMOGLOBIN A1C
Hgb A1c MFr Bld: 6.2 % — ABNORMAL HIGH (ref <5.7)
Mean Plasma Glucose: 131 mg/dL — ABNORMAL HIGH (ref <117)

## 2011-09-13 LAB — FOLATE: Folate: 17.6 ng/mL

## 2011-09-13 LAB — LIPID PANEL
Cholesterol: 158 mg/dL (ref 0–200)
HDL: 44 mg/dL (ref >39)
Total CHOL/HDL Ratio: 3.6 Ratio
Triglycerides: 78 mg/dL (ref <150)

## 2011-09-13 LAB — VITAMIN D 25 HYDROXY (VIT D DEFICIENCY, FRACTURES): Vit D, 25-Hydroxy: 35 ng/mL (ref 30–89)

## 2011-09-19 ENCOUNTER — Encounter: Payer: Self-pay | Admitting: Family Medicine

## 2011-09-19 ENCOUNTER — Ambulatory Visit (INDEPENDENT_AMBULATORY_CARE_PROVIDER_SITE_OTHER): Payer: Managed Care, Other (non HMO) | Admitting: Family Medicine

## 2011-09-19 VITALS — BP 138/74 | HR 89 | Ht 64.0 in | Wt 270.0 lb

## 2011-09-19 DIAGNOSIS — E119 Type 2 diabetes mellitus without complications: Secondary | ICD-10-CM

## 2011-09-19 DIAGNOSIS — R7301 Impaired fasting glucose: Secondary | ICD-10-CM

## 2011-09-19 DIAGNOSIS — E785 Hyperlipidemia, unspecified: Secondary | ICD-10-CM

## 2011-09-19 NOTE — Progress Notes (Signed)
  Subjective:    Patient ID: Kelly Roth, female    DOB: November 11, 1950, 61 y.o.   MRN: [id number]  HPI DM - doing well. HAs been watching what she is eating. Has lost 3 lbs.    Hyperlipidemia - Recheck on the livalo. Doing well.    IFG - has been trying to watch her diet. No regular exercise.    Review of Systems     Objective:   Physical Exam  Constitutional: She appears well-developed and well-nourished.  HENT:  Head: Normocephalic and atraumatic.  Skin: Skin is warm and dry.  Psychiatric: She has a normal mood and affect. Her behavior is normal.          Assessment & Plan:  IFG - doing well.  Continue to watch diet and exercise.  Recheck in 6 months.  Recheck A1C at that time.   Hyperlipidemia  - dong well on livalo. Givne copy of lab results and reviewed with him.

## 2011-09-19 NOTE — Patient Instructions (Addendum)
See you in 6 months!

## 2011-10-03 ENCOUNTER — Telehealth: Payer: Self-pay | Admitting: *Deleted

## 2011-10-03 ENCOUNTER — Other Ambulatory Visit: Payer: Self-pay | Admitting: Family Medicine

## 2011-10-03 DIAGNOSIS — R600 Localized edema: Secondary | ICD-10-CM

## 2011-10-03 NOTE — Telephone Encounter (Signed)
Pt calls and wants to know if there is anyone she can see for the swelling in her legs. Not weeping any fluid- just tightness and only on the left leg.

## 2011-10-03 NOTE — Telephone Encounter (Signed)
We can refer her to vein and vascular center.

## 2011-10-03 NOTE — Telephone Encounter (Signed)
Pt notified and would like a referral to vein and vascular. KG LPN

## 2011-10-06 ENCOUNTER — Telehealth: Payer: Self-pay | Admitting: Family Medicine

## 2011-10-06 MED ORDER — PIROXICAM 10 MG PO CAPS: 10.0000 mg | ORAL_CAPSULE | Freq: Two times a day (BID) | ORAL | Status: DC

## 2011-10-06 NOTE — Telephone Encounter (Signed)
Pharmacy [facility] [phone]. Pharmacy states [facility] was at [facility] wanting a 90 day supply of Piroxicam.

## 2011-10-06 NOTE — Telephone Encounter (Signed)
rx for 90 days supply sent

## 2011-11-03 ENCOUNTER — Other Ambulatory Visit: Payer: Self-pay | Admitting: *Deleted

## 2011-11-03 MED ORDER — PITAVASTATIN CALCIUM 4 MG PO TABS: 4.0000 mg | ORAL_TABLET | Freq: Every day | ORAL | Status: DC

## 2012-01-04 ENCOUNTER — Other Ambulatory Visit: Payer: Self-pay | Admitting: Family Medicine

## 2012-01-05 ENCOUNTER — Other Ambulatory Visit: Payer: Self-pay | Admitting: Family Medicine

## 2012-03-15 ENCOUNTER — Encounter: Payer: Self-pay | Admitting: Family Medicine

## 2012-03-15 ENCOUNTER — Ambulatory Visit (INDEPENDENT_AMBULATORY_CARE_PROVIDER_SITE_OTHER): Payer: Managed Care, Other (non HMO) | Admitting: Family Medicine

## 2012-03-15 VITALS — BP 138/76 | HR 84 | Resp 18 | Wt 270.0 lb

## 2012-03-15 DIAGNOSIS — Z720 Tobacco use: Secondary | ICD-10-CM

## 2012-03-15 DIAGNOSIS — Z23 Encounter for immunization: Secondary | ICD-10-CM

## 2012-03-15 DIAGNOSIS — I10 Essential (primary) hypertension: Secondary | ICD-10-CM

## 2012-03-15 DIAGNOSIS — R7301 Impaired fasting glucose: Secondary | ICD-10-CM

## 2012-03-15 DIAGNOSIS — F17200 Nicotine dependence, unspecified, uncomplicated: Secondary | ICD-10-CM

## 2012-03-15 DIAGNOSIS — E785 Hyperlipidemia, unspecified: Secondary | ICD-10-CM

## 2012-03-15 LAB — POCT GLYCOSYLATED HEMOGLOBIN (HGB A1C): Hemoglobin A1C: 5.4

## 2012-03-15 MED ORDER — INFLUENZA VIRUS VACC SPLIT PF IM SUSP
0.5000 mL | Freq: Once | INTRAMUSCULAR | Status: AC
  Administered 2012-03-15: 0.5 mL via INTRAMUSCULAR

## 2012-03-15 NOTE — Patient Instructions (Addendum)
Work on exercise and low fat and low carb diet.   Follow up in 6 months.

## 2012-03-15 NOTE — Progress Notes (Signed)
  Subjective:    Patient ID: Kelly Roth, female    DOB: 1951/01/25, 62 y.o.   MRN: [id number]  HPI IFG - due to recheck. Says has tried ot make some dietary changes but still no exercise.she i having vein surgery later this month.   Lab Results  Component Value Date   HGBA1C 6.3 09/19/2011    HTN-  Pt denies chest pain, SOB, dizziness, or heart palpitations.  Taking meds as directed w/o problems.  Denies medication side effects.  + still smoking.   Hyperlipidemia - No myalgias or SE on medcations.  Taking nightly.    Review of Systems     Objective:   Physical Exam  Constitutional: She is oriented to person, place, and time. She appears well-developed and well-nourished.  HENT:  Head: Normocephalic and atraumatic.  Cardiovascular: Normal rate, regular rhythm and normal heart sounds.   Pulmonary/Chest: Effort normal and breath sounds normal.  Neurological: She is alert and oriented to person, place, and time.  Skin: Skin is warm and dry.  Psychiatric: She has a normal mood and affect. Her behavior is normal.          Assessment & Plan:  HTN - Well controlled.  Continue current regimen.  F/u in 6 months   Hyperlipidemia - Well controlled, in July.  Says she is compliant.  Lab Results  Component Value Date   CHOL 158 09/12/2011   HDL 44 09/12/2011   LDLCALC 98 09/12/2011   TRIG 78 09/12/2011   CHOLHDL 3.6 09/12/2011   IFG - Well controlled.  Much improved.  Great job! Keep up the good work.   Lab Results  Component Value Date   HGBA1C 5.4 03/15/2012    Tob Abuse - Says wants to quit this year. She wants he rhusband to quit with her. Encouraged cessation.   Flu vaccine given today.

## 2012-03-16 LAB — COMPLETE METABOLIC PANEL WITH GFR
AST: 17 U/L (ref 0–37)
Albumin: 4.3 g/dL (ref 3.5–5.2)
Alkaline Phosphatase: 77 U/L (ref 39–117)
BUN: 11 mg/dL (ref 6–23)
Calcium: 9.6 mg/dL (ref 8.4–10.5)
Chloride: 100 mEq/L (ref 96–112)
Creat: 0.65 mg/dL (ref 0.50–1.10)
GFR, Est Non African American: >89 mL/min
Glucose, Bld: 93 mg/dL (ref 70–99)

## 2012-03-16 NOTE — Progress Notes (Signed)
Quick Note:  All labs are normal. ______ 

## 2012-04-02 ENCOUNTER — Ambulatory Visit (INDEPENDENT_AMBULATORY_CARE_PROVIDER_SITE_OTHER): Payer: Managed Care, Other (non HMO)

## 2012-04-02 ENCOUNTER — Ambulatory Visit (INDEPENDENT_AMBULATORY_CARE_PROVIDER_SITE_OTHER): Payer: Managed Care, Other (non HMO) | Admitting: Family Medicine

## 2012-04-02 ENCOUNTER — Encounter: Payer: Self-pay | Admitting: Family Medicine

## 2012-04-02 VITALS — BP 148/84 | HR 101 | Resp 20 | Wt 267.0 lb

## 2012-04-02 DIAGNOSIS — M25551 Pain in right hip: Secondary | ICD-10-CM

## 2012-04-02 DIAGNOSIS — W19XXXA Unspecified fall, initial encounter: Secondary | ICD-10-CM

## 2012-04-02 DIAGNOSIS — M25559 Pain in unspecified hip: Secondary | ICD-10-CM

## 2012-04-02 DIAGNOSIS — M545 Low back pain, unspecified: Secondary | ICD-10-CM

## 2012-04-02 DIAGNOSIS — I10 Essential (primary) hypertension: Secondary | ICD-10-CM

## 2012-04-02 MED ORDER — PREDNISONE 20 MG PO TABS: 40.0000 mg | ORAL_TABLET | Freq: Every day | ORAL | Status: DC

## 2012-04-02 MED ORDER — CYCLOBENZAPRINE HCL 10 MG PO TABS: 10.0000 mg | ORAL_TABLET | Freq: Three times a day (TID) | ORAL | Status: DC | PRN

## 2012-04-02 MED ORDER — HYDROCODONE-ACETAMINOPHEN 7.5-325 MG PO TABS: 1.0000 | ORAL_TABLET | Freq: Three times a day (TID) | ORAL | Status: DC | PRN

## 2012-04-02 NOTE — Progress Notes (Signed)
Subjective:    Patient ID: Kelly Roth, female    DOB: 01-26-1951, 62 y.o.   MRN: [id number]  HPI Right hip pain- Kimmerly has right hip pain for 1 week. She denies injury on the day that her pain started.. She decribes the pain as a dull ache. She applied heat to the area and had some relief. Feel at work on Jan 8th. Then a couple fo days later started hurting in her right hip and low back. She describes the pain as severe.Marland Kitchen Has been taking her husbands pain pills and helps some. Has been using heating pad - does helps some.  Feels like a grabbing, pinching sensation sometimes when sitting, and has to stand up.  Sometimes stretching will aggrevate.  Pain over right low back around to her trochanter.      Review of Systems BP 148/84  Pulse 101  Resp 20  Wt 267 lb (121.11 kg)  SpO2 96%    No Known Allergies  Past Medical History  Diagnosis Date  . Arthritis   . Osteoarthritis   . Hyperlipidemia   . Hemorrhoids   . GERD (gastroesophageal reflux disease)   . Adenomatous polyps 2004     Colonoscopy done in [facility]  . Hypertension     Past Surgical History  Procedure Date  . 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 Cholecystectomy 05-08-07  . Multiple tooth extractions     History   Social History  . Marital Status: Married    Spouse Name: N/A    Number of Children: N/A  . Years of Education: N/A   Occupational History  . Not on file.   Social History Main Topics  . Smoking status: Current Every Day Smoker -- 1.0 packs/day  . Smokeless tobacco: Never Used  . Alcohol Use: 0.0 oz/week     Comment: one drink a week   . Drug Use: No  . Sexually Active: Not on file   Other Topics Concern  . Not on file   Social History Narrative  . No narrative on file    Family History  Problem Relation Age of Onset  . Depression Mother   . Hypertension Mother   . Hyperlipidemia Mother   . Cancer Mother     lung  . Hyperlipidemia Father   .  Hypertension Father   . Colon cancer Neg Hx     Neg     Outpatient Encounter Prescriptions as of 04/02/2012  Medication Sig Dispense Refill  . fish oil-omega-3 fatty acids 1000 MG capsule Take 2 g by mouth daily.        Marland Kitchen lisinopril-hydrochlorothiazide (PRINZIDE,ZESTORETIC) 20-12.5 MG per tablet TAKE 1 TABLET BY MOUTH DAILY.  90 tablet  1  . Multiple Vitamin (MULTIVITAMIN) capsule Take 1 capsule by mouth daily.        . piroxicam (FELDENE) 10 MG capsule TAKE 1 CAPSULE (10 MG TOTAL) BY MOUTH 2 (TWO) TIMES DAILY.  180 capsule  0  . Pitavastatin Calcium (LIVALO) 4 MG TABS Take 1 tablet (4 mg total) by mouth at bedtime.  90 tablet  1  . albuterol (VENTOLIN HFA) 108 (90 BASE) MCG/ACT inhaler Inhale 2 puffs into the lungs every 6 (six) hours as needed for wheezing.  1 Inhaler  1  . cyclobenzaprine (FLEXERIL) 10 MG tablet Take 1 tablet (10 mg total) by mouth 3 (three) times daily as needed for muscle spasms.  60 tablet  0  . HYDROcodone-acetaminophen (NORCO)  7.5-325 MG per tablet Take 1 tablet by mouth every 8 (eight) hours as needed for pain.  30 tablet  0  . predniSONE (DELTASONE) 20 MG tablet Take 2 tablets (40 mg total) by mouth daily.  10 tablet  0          Objective:   Physical Exam  Constitutional: She is oriented to person, place, and time. She appears well-developed and well-nourished.  Musculoskeletal:       Normal extension, rotation, side bending of lumbar spine.  Dec flexion. Normal rotation of the right hip. No significant pain along the groin crease. Hip, knee, ankle strength is 5 out of 5 in both extremities. She is mildly tender over the right SI joint. Mild tenderness over the lumbar spine as well.  Neurological: She is alert and oriented to person, place, and time.  Skin: Skin is warm and dry.  Psychiatric: She has a normal mood and affect. Her behavior is normal.          Assessment & Plan:  Low back pain-I think most of her pain is actually coming from her back. I  suspect that she may actually have a herniated disc. She does have a little bit of tenderness of the right SI joint as well. Recommend trial of prednisone, muscle relaxer, and a home stretches. H.O given.  Consider more formal physical therapy if she's not improving. Recommend hold any other anti-inflammatory as well she's on the prednisone and stop the medication nearly if she has any GI irritation or discomfort. Followup in 3-4 weeks to make sure that she's improving. Given a small quantity of hydrocodone to use as needed. Encouraged her not to use her husbands medications.  Right hip pain-I. suspect most likely coming from her low back. We did go ahead and get x-rays of the right hip since she did have a fall at work and there is no fracture. She does have some mild arthritis.  Hypertension-uncontrolled-that she is in pain today. We'll recheck at her followup in 3-4 weeks.

## 2012-04-12 ENCOUNTER — Other Ambulatory Visit: Payer: Self-pay | Admitting: Family Medicine

## 2012-04-13 ENCOUNTER — Other Ambulatory Visit: Payer: Self-pay | Admitting: Family Medicine

## 2012-04-13 ENCOUNTER — Other Ambulatory Visit: Payer: Self-pay | Admitting: *Deleted

## 2012-04-13 DIAGNOSIS — M461 Sacroiliitis, not elsewhere classified: Secondary | ICD-10-CM

## 2012-04-13 DIAGNOSIS — M545 Low back pain, unspecified: Secondary | ICD-10-CM

## 2012-04-13 DIAGNOSIS — M25551 Pain in right hip: Secondary | ICD-10-CM

## 2012-04-13 DIAGNOSIS — M47818 Spondylosis without myelopathy or radiculopathy, sacral and sacrococcygeal region: Secondary | ICD-10-CM

## 2012-04-13 MED ORDER — HYDROCODONE-ACETAMINOPHEN 7.5-325 MG PO TABS: 1.0000 | ORAL_TABLET | Freq: Three times a day (TID) | ORAL | Status: DC | PRN

## 2012-04-13 NOTE — Telephone Encounter (Signed)
Okay to refill hydrocodone. I will also place an order for physical therapy. Told her she wasn't progressing over the next week that she needs to get into formal physical therapy to get her better so she's not relying on narcotic pain medications to improve.

## 2012-04-13 NOTE — Telephone Encounter (Signed)
Pt calls and ask if she can get more pain pills for her back pain

## 2012-04-23 ENCOUNTER — Ambulatory Visit: Payer: Managed Care, Other (non HMO) | Admitting: Family Medicine

## 2012-04-23 ENCOUNTER — Ambulatory Visit: Payer: Managed Care, Other (non HMO) | Admitting: Physical Therapy

## 2012-04-24 ENCOUNTER — Encounter: Payer: Self-pay | Admitting: Gastroenterology

## 2012-04-30 ENCOUNTER — Ambulatory Visit: Payer: Managed Care, Other (non HMO) | Admitting: Physical Therapy

## 2012-04-30 ENCOUNTER — Ambulatory Visit: Payer: Managed Care, Other (non HMO) | Attending: Family Medicine | Admitting: Physical Therapy

## 2012-04-30 DIAGNOSIS — IMO0001 Reserved for inherently not codable concepts without codable children: Secondary | ICD-10-CM | POA: Insufficient documentation

## 2012-04-30 DIAGNOSIS — M2550 Pain in unspecified joint: Secondary | ICD-10-CM | POA: Insufficient documentation

## 2012-04-30 DIAGNOSIS — M6281 Muscle weakness (generalized): Secondary | ICD-10-CM | POA: Insufficient documentation

## 2012-05-01 ENCOUNTER — Encounter: Payer: Managed Care, Other (non HMO) | Admitting: Physical Therapy

## 2012-05-04 ENCOUNTER — Ambulatory Visit: Payer: Managed Care, Other (non HMO) | Admitting: Physical Therapy

## 2012-05-07 ENCOUNTER — Ambulatory Visit: Payer: Managed Care, Other (non HMO) | Admitting: Physical Therapy

## 2012-05-07 ENCOUNTER — Encounter: Payer: Managed Care, Other (non HMO) | Admitting: Physical Therapy

## 2012-05-10 ENCOUNTER — Encounter: Payer: Managed Care, Other (non HMO) | Admitting: Physical Therapy

## 2012-05-10 ENCOUNTER — Ambulatory Visit: Payer: Managed Care, Other (non HMO) | Admitting: Physical Therapy

## 2012-05-23 ENCOUNTER — Other Ambulatory Visit: Payer: Self-pay | Admitting: *Deleted

## 2012-05-23 MED ORDER — PITAVASTATIN CALCIUM 4 MG PO TABS: 4.0000 mg | ORAL_TABLET | Freq: Every day | ORAL | Status: DC

## 2012-05-26 ENCOUNTER — Other Ambulatory Visit: Payer: Self-pay | Admitting: Family Medicine

## 2012-05-29 ENCOUNTER — Telehealth: Payer: Self-pay | Admitting: Family Medicine

## 2012-05-29 MED ORDER — ROSUVASTATIN CALCIUM 40 MG PO TABS: 40.0000 mg | ORAL_TABLET | Freq: Every day | ORAL | Status: DC

## 2012-05-29 NOTE — Telephone Encounter (Signed)
Call patient: Please let her that we got a note from her insurance company requesting that we change her Livalo to Crestor as it is a preferred drug on her formulary. It says she has to have tried that before they will pay for Livalo. Please let us know if it's okay to send a new prescription [facility] to [facility] [place] [address].

## 2012-05-29 NOTE — Telephone Encounter (Signed)
Pt states that crestor is fine.  She also stated that sometime in the past a doctor had given her crestor but then changed because a new drug was on the market & they wanted her to try that.

## 2012-05-29 NOTE — Telephone Encounter (Signed)
Switched to Crestor.

## 2012-06-15 ENCOUNTER — Other Ambulatory Visit: Payer: Self-pay

## 2012-06-15 MED ORDER — CYCLOBENZAPRINE HCL 10 MG PO TABS: 10.0000 mg | ORAL_TABLET | Freq: Three times a day (TID) | ORAL | Status: DC | PRN

## 2012-06-15 NOTE — Telephone Encounter (Signed)
Kelly Roth called and states she wants a refill on Flexeril for her back spasms. She was seen back in January and did go through PT. She states she is still doing the exercises and this does help.

## 2012-06-18 ENCOUNTER — Encounter: Payer: Self-pay | Admitting: Physician Assistant

## 2012-06-18 ENCOUNTER — Ambulatory Visit (INDEPENDENT_AMBULATORY_CARE_PROVIDER_SITE_OTHER): Payer: Managed Care, Other (non HMO) | Admitting: Physician Assistant

## 2012-06-18 VITALS — BP 138/75 | HR 93 | Wt 272.0 lb

## 2012-06-18 DIAGNOSIS — S29012A Strain of muscle and tendon of back wall of thorax, initial encounter: Secondary | ICD-10-CM

## 2012-06-18 DIAGNOSIS — M549 Dorsalgia, unspecified: Secondary | ICD-10-CM

## 2012-06-18 DIAGNOSIS — S239XXA Sprain of unspecified parts of thorax, initial encounter: Secondary | ICD-10-CM

## 2012-06-18 MED ORDER — KETOROLAC TROMETHAMINE 60 MG/2ML IM SOLN
60.0000 mg | Freq: Once | INTRAMUSCULAR | Status: AC
  Administered 2012-06-18: 60 mg via INTRAMUSCULAR

## 2012-06-18 MED ORDER — CYCLOBENZAPRINE HCL 10 MG PO TABS: 10.0000 mg | ORAL_TABLET | Freq: Three times a day (TID) | ORAL | Status: DC | PRN

## 2012-06-18 MED ORDER — TRAMADOL HCL 50 MG PO TABS
50.0000 mg | ORAL_TABLET | Freq: Three times a day (TID) | ORAL | Status: DC | PRN
Start: 2012-06-18 — End: 2012-09-13

## 2012-06-18 MED ORDER — MELOXICAM 7.5 MG PO TABS: ORAL_TABLET | ORAL | Status: DC

## 2012-06-18 NOTE — Patient Instructions (Addendum)
Use mobic daily for 2 weeks then as needed. Shot of toradol given today.  Start home exercises.  Use flexeril as needed.  Tramadol given for breakthrough pain.

## 2012-06-18 NOTE — Progress Notes (Signed)
  Subjective:    Patient ID: Kelly Roth, female    DOB: 1950/04/01, 62 y.o.   MRN: [id number]  HPI Patient presents to the clinic with upper back pain from her bra straps to the base of her neck. She has never had pain like this before. She has a history of low back pain frequently. Her low back feels much better today. Upper back pain is bilateral and been going on for 3 days. Pain is mostly around 5/10 but can get as bad as 9/10 when upper back "catches". She describes this as feeling like her entire back is cramping. Denies any trauma to the actual back but she did fall out of bed on Thursday but landed on her right knee. Denies any numbness or tinling down arms. NO neck pain and normal ROM.     Review of Systems     Objective:   Physical Exam  Constitutional: She is oriented to person, place, and time. She appears well-developed and well-nourished.  HENT:  Head: Normocephalic and atraumatic.  Cardiovascular: Normal rate, regular rhythm and normal heart sounds.   Pulmonary/Chest: Effort normal and breath sounds normal.  Musculoskeletal:  No pain over spine. paraspinus tenderness/tightness to palpation over scapula bilaterally. ROM full and normal with arms and neck.   Neurological: She is alert and oriented to person, place, and time.  Skin: Skin is warm and dry.  Psychiatric: She has a normal mood and affect. Her behavior is normal.          Assessment & Plan:  Rhomboid strain- Gave shot of toradol 60mg . Rx mobic to use daily for 2 weeks then as needed. Refilled flexeril.  Gave tramadol for breakthrough pain. Gave exercises to start daily. Follow up if not resolving in next 2 weeks.

## 2012-07-08 ENCOUNTER — Other Ambulatory Visit: Payer: Self-pay | Admitting: Family Medicine

## 2012-07-18 ENCOUNTER — Other Ambulatory Visit: Payer: Self-pay | Admitting: Family Medicine

## 2012-07-23 ENCOUNTER — Other Ambulatory Visit: Payer: Self-pay | Admitting: Family Medicine

## 2012-07-25 ENCOUNTER — Other Ambulatory Visit: Payer: Self-pay | Admitting: Family Medicine

## 2012-08-01 ENCOUNTER — Other Ambulatory Visit: Payer: Self-pay | Admitting: Family Medicine

## 2012-09-13 ENCOUNTER — Ambulatory Visit (INDEPENDENT_AMBULATORY_CARE_PROVIDER_SITE_OTHER): Payer: Managed Care, Other (non HMO) | Admitting: Family Medicine

## 2012-09-13 ENCOUNTER — Ambulatory Visit (INDEPENDENT_AMBULATORY_CARE_PROVIDER_SITE_OTHER): Payer: Managed Care, Other (non HMO)

## 2012-09-13 ENCOUNTER — Encounter: Payer: Self-pay | Admitting: Family Medicine

## 2012-09-13 VITALS — BP 139/84 | HR 93 | Ht 65.0 in | Wt 271.0 lb

## 2012-09-13 DIAGNOSIS — M545 Low back pain, unspecified: Secondary | ICD-10-CM

## 2012-09-13 DIAGNOSIS — F17200 Nicotine dependence, unspecified, uncomplicated: Secondary | ICD-10-CM

## 2012-09-13 DIAGNOSIS — E8881 Metabolic syndrome: Secondary | ICD-10-CM

## 2012-09-13 DIAGNOSIS — E785 Hyperlipidemia, unspecified: Secondary | ICD-10-CM

## 2012-09-13 DIAGNOSIS — Z720 Tobacco use: Secondary | ICD-10-CM

## 2012-09-13 DIAGNOSIS — M5137 Other intervertebral disc degeneration, lumbosacral region: Secondary | ICD-10-CM

## 2012-09-13 LAB — POCT GLYCOSYLATED HEMOGLOBIN (HGB A1C): Hemoglobin A1C: 5.9

## 2012-09-13 MED ORDER — PIROXICAM 10 MG PO CAPS: 10.0000 mg | ORAL_CAPSULE | Freq: Two times a day (BID) | ORAL | Status: DC | PRN

## 2012-09-13 MED ORDER — TRAMADOL HCL 50 MG PO TABS: 50.0000 mg | ORAL_TABLET | Freq: Three times a day (TID) | ORAL | Status: DC | PRN

## 2012-09-13 MED ORDER — CYCLOBENZAPRINE HCL 10 MG PO TABS: 10.0000 mg | ORAL_TABLET | Freq: Two times a day (BID) | ORAL | Status: DC | PRN

## 2012-09-13 NOTE — Patient Instructions (Signed)

## 2012-09-13 NOTE — Progress Notes (Signed)
  Subjective:    Patient ID: Kelly Roth, female    DOB: March 31, 1950, 62 y.o.   MRN: [id number]  HPI Hypertension- Pt denies chest pain, SOB, dizziness, or heart palpitations.  Taking meds as directed w/o problems.  Denies medication side effects.     Impaired fasting glucose-  Still having a lot of back pain. Went to the [employer] store and she has bought some inserts.  Says pain is worse with walking.Did complete PT. Helped some.  Her hip is no longer painful.     Review of Systems     Objective:   Physical Exam  Constitutional: She is oriented to person, place, and time. She appears well-developed and well-nourished.  HENT:  Head: Normocephalic and atraumatic.  Cardiovascular: Normal rate, regular rhythm and normal heart sounds.   Pulmonary/Chest: Effort normal and breath sounds normal.  Musculoskeletal:  Normal flexion, extension, rotation right and left. She has significant pain with full flexion. Mildly tender over the lumbar spine. She points to the area between L1-L4 as her concentrated area pain. They do straight leg raise bilaterally. Hip, knee, ankle strength is 5 out of 5. Patellar reflexes 2+ on the left and 1+ on the right.  Neurological: She is alert and oriented to person, place, and time.  Skin: Skin is warm and dry.  Psychiatric: She has a normal mood and affect. Her behavior is normal.          Assessment & Plan:  Hypertension- well controlled.  F/U in 6 months.   Impaired fasting glucose- stable. Continue to monitor every 6 months. Continue work on low sugar, low carbohydrate diet and regular exercise. Lab Results  Component Value Date   HGBA1C 5.9 09/13/2012   Chronic low back pain-she did get some improvement with physical therapy but still has significant pain and discomfort. It seems to be worse with activity. I will refill her tramadol and muscle relaxer today. Will order lumbar x-rays. We'll call her with the results once available.

## 2012-09-21 LAB — COMPLETE METABOLIC PANEL WITH GFR
ALT: 27 U/L (ref 0–35)
BUN: 15 mg/dL (ref 6–23)
CO2: 28 mEq/L (ref 19–32)
Calcium: 9.9 mg/dL (ref 8.4–10.5)
Chloride: 103 mEq/L (ref 96–112)
Creat: 0.66 mg/dL (ref 0.50–1.10)
GFR, Est African American: >89 mL/min
GFR, Est Non African American: >89 mL/min
Glucose, Bld: 101 mg/dL — ABNORMAL HIGH (ref 70–99)
Total Bilirubin: 0.6 mg/dL (ref 0.3–1.2)

## 2012-09-21 LAB — LIPID PANEL
LDL Cholesterol: 90 mg/dL (ref 0–99)
Triglycerides: 95 mg/dL (ref <150)

## 2012-09-21 NOTE — Progress Notes (Signed)
Quick Note:  All labs are normal. ______ 

## 2012-09-25 ENCOUNTER — Ambulatory Visit (INDEPENDENT_AMBULATORY_CARE_PROVIDER_SITE_OTHER): Payer: Managed Care, Other (non HMO) | Admitting: Sports Medicine

## 2012-09-25 ENCOUNTER — Encounter: Payer: Self-pay | Admitting: Sports Medicine

## 2012-09-25 VITALS — BP 131/70 | HR 86 | Wt 266.0 lb

## 2012-09-25 DIAGNOSIS — G8929 Other chronic pain: Secondary | ICD-10-CM

## 2012-09-25 DIAGNOSIS — M545 Low back pain, unspecified: Secondary | ICD-10-CM

## 2012-09-25 DIAGNOSIS — M48061 Spinal stenosis, lumbar region without neurogenic claudication: Secondary | ICD-10-CM | POA: Insufficient documentation

## 2012-09-25 DIAGNOSIS — M549 Dorsalgia, unspecified: Secondary | ICD-10-CM

## 2012-09-25 MED ORDER — TRAMADOL HCL 50 MG PO TABS: 50.0000 mg | ORAL_TABLET | Freq: Three times a day (TID) | ORAL | Status: DC | PRN

## 2012-09-25 MED ORDER — DIAZEPAM 5 MG PO TABS: ORAL_TABLET | ORAL | Status: DC

## 2012-09-25 MED ORDER — PREDNISONE 50 MG PO TABS: ORAL_TABLET | ORAL | Status: DC

## 2012-09-25 NOTE — Progress Notes (Signed)
   Subjective:    I'm seeing this patient as a consultation for:  Dr. Linford Arnold  CC: Low back pain  HPI: This is a very pleasant 62 year old female comes in with a several year history of on and off low back pain that she localizes in both sides of her low back with radiation into both anterior thighs. Pain is better with sitting, and riding in a car, worse with standing up straight and leaning back. She notices that she has to use a shopping cart quite often when in a grocery store. Pain is not worse with Valsalva, and there is no radicular component. She denies any lower extremity weakness, bowel or bladder dysfunction, or saddle anesthesia. She has done physical therapy over the years, and is currently using Mobic without improvement. Pain is moderate, persistent.  Past medical history, Surgical history, Family history not pertinant except as noted below, Social history, Allergies, and medications have been entered into the medical record, reviewed, and no changes needed.   Review of Systems: No headache, visual changes, nausea, vomiting, diarrhea, constipation, dizziness, abdominal pain, skin rash, fevers, chills, night sweats, weight loss, swollen lymph nodes, body aches, joint swelling, muscle aches, chest pain, shortness of breath, mood changes, visual or auditory hallucinations.   Objective:   General: Well Developed, well nourished, and in no acute distress.  Neuro/Psych: Alert and oriented x3, extra-ocular muscles intact, able to move all 4 extremities, sensation grossly intact. Skin: Warm and dry, no rashes noted.  Respiratory: Not using accessory muscles, speaking in full sentences, trachea midline.  Cardiovascular: Pulses palpable, no extremity edema. Abdomen: Does not appear distended. Back Exam:  Inspection: Unremarkable  Motion: Flexion 45 deg, Extension 45 deg, Side Bending to 45 deg bilaterally,  Rotation to 45 deg bilaterally  SLR laying: Negative  XSLR laying: Negative    Palpable tenderness: None. FABER: negative. Sensory change: Gross sensation intact to all lumbar and sacral dermatomes.  Reflexes: 2+ at both patellar tendons, 2+ at achilles tendons, Babinski's downgoing.  Strength at foot  Plantar-flexion: 5/5 Dorsi-flexion: 5/5 Eversion: 5/5 Inversion: 5/5  Leg strength  Quad: 5/5 Hamstring: 5/5 Hip flexor: 5/5 Hip abductors: 5/5  Gait unremarkable. Impression and Recommendations:   This case required medical decision making of moderate complexity.

## 2012-09-25 NOTE — Assessment & Plan Note (Signed)
With pain in the anterior thighs, and worsening with extension, improvement with flexion this is highly suspicious for lumbar spinal stenosis at the L4-L5 level affecting the L4 nerve root bilaterally, however right worse than left. Prednisone, tramadol, MRI, Valium for preprocedure sedation. Return to go over MRI results.

## 2012-09-27 ENCOUNTER — Telehealth: Payer: Self-pay | Admitting: *Deleted

## 2012-09-27 NOTE — Telephone Encounter (Signed)
No prior Berkley Harvey is needed through [place].

## 2012-09-29 ENCOUNTER — Ambulatory Visit (HOSPITAL_BASED_OUTPATIENT_CLINIC_OR_DEPARTMENT_OTHER)
Admission: RE | Admit: 2012-09-29 | Discharge: 2012-09-29 | Disposition: A | Discharge status: Home / Self Care | Payer: Managed Care, Other (non HMO) | Source: Ambulatory Visit | Attending: Sports Medicine | Admitting: Sports Medicine

## 2012-09-29 DIAGNOSIS — M545 Low back pain, unspecified: Secondary | ICD-10-CM | POA: Insufficient documentation

## 2012-09-29 DIAGNOSIS — M5124 Other intervertebral disc displacement, thoracic region: Secondary | ICD-10-CM | POA: Insufficient documentation

## 2012-09-29 DIAGNOSIS — M549 Dorsalgia, unspecified: Secondary | ICD-10-CM

## 2012-09-29 DIAGNOSIS — G8929 Other chronic pain: Secondary | ICD-10-CM

## 2012-10-09 ENCOUNTER — Telehealth: Payer: Self-pay | Admitting: *Deleted

## 2012-10-09 NOTE — Telephone Encounter (Signed)
Pt calls and wants to know what her MRI results are and when she needs to make an appointment with you again. Barry Dienes, LPN

## 2012-10-09 NOTE — Telephone Encounter (Signed)
1. Shallow appearing disc protrusions at T10-11, T11-12 and T12-L1  are imaged in the sagittal plane only. The T10-11 and T11-12 pole  protrusions may slightly deform the cord but the central canal  appears open at each of these levels.  2. Shallow disc bulge at L2-3 causes mild to moderate central  canal narrowing.  3. Shallow disc bulge more prominent to the left at L3-4 causes  mild central canal, left lateral recess and foraminal narrowing.  4. Shallow disc bulge extending into the foramina at L5-S1 causes  moderate to moderately severe right and mild to moderate left  foraminal narrowing.   She should come back to see me at my next available slot to find out what all this means and what the next step should be. I do want to explain this to her, show her the images, and go over the anatomy to determine the next best step.

## 2012-10-10 NOTE — Telephone Encounter (Signed)
Pt notified of MD instructions via VM.  Instructed to call office back and schedule appt with Dr. Karie Schwalbe to discuss MRI results and plan of care. Barry Dienes, LPN

## 2012-10-11 ENCOUNTER — Encounter: Payer: Self-pay | Admitting: Sports Medicine

## 2012-10-11 ENCOUNTER — Ambulatory Visit (INDEPENDENT_AMBULATORY_CARE_PROVIDER_SITE_OTHER): Payer: Managed Care, Other (non HMO) | Admitting: Sports Medicine

## 2012-10-11 VITALS — BP 131/86 | HR 96 | Wt 266.0 lb

## 2012-10-11 DIAGNOSIS — M549 Dorsalgia, unspecified: Secondary | ICD-10-CM

## 2012-10-11 DIAGNOSIS — G8929 Other chronic pain: Secondary | ICD-10-CM

## 2012-10-11 NOTE — Assessment & Plan Note (Signed)
Symptoms are highly classic for lumbar spinal stenosis, she has multiple level disc protrusions, the worst her L2-L3 which I think is the cause of her symptoms, as well as an L3-L4, she does have some lower thoracic protrusions as well. He does the symptoms wraparound her lateral hip and groin in more of an L2 distribution, we will proceed with bilateral L2-L3 transforaminal epidural steroid injections. If ineffective we can certainly try the L3-L4 level. I'd like to see her back approximately one week after the injections.

## 2012-10-11 NOTE — Progress Notes (Signed)
  Subjective:    CC: MRI results  HPI: This is a very pleasant 62 year old female with chronic low back pain who comes in to followup her MRI. To recap, she's already done home exercises, oral medications, her pain is localized in the low back radiating around the lateral aspect of the hips into the groin on both sides evenly. It's worse with standing up straight and leaning back, is better flexion, and leaning on a shopping cart. Not worse with Valsalva. Denies any bowel or bladder incontinence or saddle anesthesia. No trauma. Moderate, persistent.  Past medical history, Surgical history, Family history not pertinant except as noted below, Social history, Allergies, and medications have been entered into the medical record, reviewed, and no changes needed.   Review of Systems: No fevers, chills, night sweats, weight loss, chest pain, or shortness of breath.   Objective:    General: Well Developed, well nourished, and in no acute distress.  Neuro: Alert and oriented x3, extra-ocular muscles intact, sensation grossly intact.  HEENT: Normocephalic, atraumatic, pupils equal round reactive to light, neck supple, no masses, no lymphadenopathy, thyroid nonpalpable.  Skin: Warm and dry, no rashes. Cardiac: Regular rate and rhythm, no murmurs rubs or gallops, no lower extremity edema.  Respiratory: Clear to auscultation bilaterally. Not using accessory muscles, speaking in full sentences.  MRI was personally reviewed and shows multilevel disc protrusions from T11 all the way down to S1. The worst protrusions or at the L2-L3 level where there is bilateral foraminal as well as moderate central canal stenosis, and the L3-L4 where there is also bilateral foraminal as well as mild to moderate central canal stenosis. There is facet spondylosis as well as ligamentum flavum hypertrophy at multiple other levels.  Impression and Recommendations:

## 2012-10-15 ENCOUNTER — Other Ambulatory Visit: Payer: Self-pay | Admitting: Family Medicine

## 2012-10-15 NOTE — Telephone Encounter (Signed)
She had a refill for august.

## 2012-10-18 ENCOUNTER — Other Ambulatory Visit: Payer: Self-pay | Admitting: Sports Medicine

## 2012-10-18 ENCOUNTER — Ambulatory Visit
Admission: RE | Admit: 2012-10-18 | Discharge: 2012-10-18 | Disposition: A | Discharge status: Home / Self Care | Payer: Managed Care, Other (non HMO) | Source: Ambulatory Visit | Attending: Sports Medicine | Admitting: Sports Medicine

## 2012-10-18 VITALS — BP 152/85 | HR 105

## 2012-10-18 DIAGNOSIS — R0989 Other specified symptoms and signs involving the circulatory and respiratory systems: Secondary | ICD-10-CM

## 2012-10-18 DIAGNOSIS — N281 Cyst of kidney, acquired: Secondary | ICD-10-CM

## 2012-10-18 DIAGNOSIS — F39 Unspecified mood [affective] disorder: Secondary | ICD-10-CM

## 2012-10-18 DIAGNOSIS — G8929 Other chronic pain: Secondary | ICD-10-CM

## 2012-10-18 DIAGNOSIS — I10 Essential (primary) hypertension: Secondary | ICD-10-CM

## 2012-10-18 DIAGNOSIS — E669 Obesity, unspecified: Secondary | ICD-10-CM

## 2012-10-18 DIAGNOSIS — D126 Benign neoplasm of colon, unspecified: Secondary | ICD-10-CM

## 2012-10-18 DIAGNOSIS — K219 Gastro-esophageal reflux disease without esophagitis: Secondary | ICD-10-CM

## 2012-10-18 DIAGNOSIS — R933 Abnormal findings on diagnostic imaging of other parts of digestive tract: Secondary | ICD-10-CM

## 2012-10-18 DIAGNOSIS — R351 Nocturia: Secondary | ICD-10-CM

## 2012-10-18 DIAGNOSIS — B351 Tinea unguium: Secondary | ICD-10-CM

## 2012-10-18 DIAGNOSIS — K5900 Constipation, unspecified: Secondary | ICD-10-CM

## 2012-10-18 DIAGNOSIS — E8881 Metabolic syndrome: Secondary | ICD-10-CM

## 2012-10-18 DIAGNOSIS — R0609 Other forms of dyspnea: Secondary | ICD-10-CM

## 2012-10-18 DIAGNOSIS — Z86010 Personal history of colonic polyps: Secondary | ICD-10-CM

## 2012-10-18 DIAGNOSIS — E785 Hyperlipidemia, unspecified: Secondary | ICD-10-CM

## 2012-10-18 DIAGNOSIS — K648 Other hemorrhoids: Secondary | ICD-10-CM

## 2012-10-18 MED ORDER — METHYLPREDNISOLONE ACETATE 40 MG/ML INJ SUSP (RADIOLOG
120.0000 mg | Freq: Once | INTRAMUSCULAR | Status: AC
  Administered 2012-10-18: 120 mg via EPIDURAL

## 2012-10-18 MED ORDER — IOHEXOL 180 MG/ML  SOLN
1.0000 mL | Freq: Once | INTRAMUSCULAR | Status: AC | PRN
  Administered 2012-10-18: 1 mL via EPIDURAL

## 2012-11-03 ENCOUNTER — Other Ambulatory Visit: Payer: Self-pay | Admitting: Family Medicine

## 2012-11-09 ENCOUNTER — Ambulatory Visit (INDEPENDENT_AMBULATORY_CARE_PROVIDER_SITE_OTHER): Payer: Managed Care, Other (non HMO) | Admitting: Sports Medicine

## 2012-11-09 ENCOUNTER — Encounter: Payer: Self-pay | Admitting: Sports Medicine

## 2012-11-09 VITALS — BP 123/73 | HR 111 | Wt 260.0 lb

## 2012-11-09 DIAGNOSIS — G8929 Other chronic pain: Secondary | ICD-10-CM

## 2012-11-09 DIAGNOSIS — M549 Dorsalgia, unspecified: Secondary | ICD-10-CM

## 2012-11-09 MED ORDER — GABAPENTIN 300 MG PO CAPS: ORAL_CAPSULE | ORAL | Status: DC

## 2012-11-09 MED ORDER — HYDROCODONE-ACETAMINOPHEN 5-325 MG PO TABS: 1.0000 | ORAL_TABLET | Freq: Three times a day (TID) | ORAL | Status: DC | PRN

## 2012-11-09 NOTE — Progress Notes (Signed)
  Subjective:    CC: Followup  HPI: This pleasant 62 year old female returns for followup of low back pain with multilevel lumbar spinal stenosis. She had a lumbar epidural which did provide her with a good response, she was highly functional, but unfortunately her pain did return. Unfortunately she also tells me she cannot afford to pay for subsequent epidurals. She desires to try oral medication management. Pain is localized, doesn't radiate, moderate.  Past medical history, Surgical history, Family history not pertinant except as noted below, Social history, Allergies, and medications have been entered into the medical record, reviewed, and no changes needed.   Review of Systems: No fevers, chills, night sweats, weight loss, chest pain, or shortness of breath.   Objective:    General: Well Developed, well nourished, and in no acute distress.  Neuro: Alert and oriented x3, extra-ocular muscles intact, sensation grossly intact.  HEENT: Normocephalic, atraumatic, pupils equal round reactive to light, neck supple, no masses, no lymphadenopathy, thyroid nonpalpable.  Skin: Warm and dry, no rashes. Cardiac: Regular rate and rhythm, no murmurs rubs or gallops, no lower extremity edema.  Respiratory: Clear to auscultation bilaterally. Not using accessory muscles, speaking in full sentences.  Impression and Recommendations:

## 2012-11-09 NOTE — Assessment & Plan Note (Signed)
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 Initial transforaminal bilateral epidural at the L2-L3 level provided a good response, unfortunately this has worn off and she cannot afford repeat injections. I am going to start her gabapentin up taper, and we will do a small amount of hydrocodone as needed for breakthrough pain. I'd like to see her back in 2 months see how things are going.

## 2012-12-20 ENCOUNTER — Encounter: Payer: Self-pay | Admitting: Gastroenterology

## 2012-12-27 ENCOUNTER — Ambulatory Visit (INDEPENDENT_AMBULATORY_CARE_PROVIDER_SITE_OTHER): Payer: Managed Care, Other (non HMO) | Admitting: Sports Medicine

## 2012-12-27 ENCOUNTER — Encounter: Payer: Self-pay | Admitting: Sports Medicine

## 2012-12-27 VITALS — BP 134/72 | HR 88 | Wt 269.0 lb

## 2012-12-27 DIAGNOSIS — M48061 Spinal stenosis, lumbar region without neurogenic claudication: Secondary | ICD-10-CM

## 2012-12-27 MED ORDER — HYDROCODONE-ACETAMINOPHEN 7.5-325 MG PO TABS: 1.0000 | ORAL_TABLET | Freq: Three times a day (TID) | ORAL | Status: DC | PRN

## 2012-12-27 NOTE — Progress Notes (Signed)
  Subjective:    CC: Followup  HPI: Lumbar degenerative disease: Kelly Roth is now status post a single bilateral L2-L3 transforaminal epidural steroid injection, she had a good response, but due to finances was unable to pursue further epidurals. She did get a good response. Now she has met her medical deductible, and is amenable to trying further epidurals. Pain is similar in her back, radiates around both box, left worse than right, to the posterior left thigh, left lower leg, to the lateral aspect of the left foot.  Past medical history, Surgical history, Family history not pertinant except as noted below, Social history, Allergies, and medications have been entered into the medical record, reviewed, and no changes needed.   Review of Systems: No fevers, chills, night sweats, weight loss, chest pain, or shortness of breath.   Objective:    General: Well Developed, well nourished, and in no acute distress.  Neuro: Alert and oriented x3, extra-ocular muscles intact, sensation grossly intact.  HEENT: Normocephalic, atraumatic, pupils equal round reactive to light, neck supple, no masses, no lymphadenopathy, thyroid nonpalpable.  Skin: Warm and dry, no rashes. Cardiac: Regular rate and rhythm, no murmurs rubs or gallops, no lower extremity edema.  Respiratory: Clear to auscultation bilaterally. Not using accessory muscles, speaking in full sentences.  Impression and Recommendations:

## 2012-12-27 NOTE — Assessment & Plan Note (Signed)
Multilevel degenerative disease with spinal stenosis worse at the L2-L3 as well as L3-L4 levels. She has now met her deductable so we will proceed again with interlaminar epidural steroid injection on the left at the L5-S1 level. I am refilling her hydrocodone. Gabapentin was effective but did cause some drowsiness when taking during the day. Return to see me in one month to see how things are going.

## 2013-01-04 ENCOUNTER — Ambulatory Visit
Admission: RE | Admit: 2013-01-04 | Discharge: 2013-01-04 | Disposition: A | Discharge status: Home / Self Care | Payer: Managed Care, Other (non HMO) | Source: Ambulatory Visit | Attending: Sports Medicine | Admitting: Sports Medicine

## 2013-01-04 MED ORDER — METHYLPREDNISOLONE ACETATE 40 MG/ML INJ SUSP (RADIOLOG
120.0000 mg | Freq: Once | INTRAMUSCULAR | Status: AC
  Administered 2013-01-04: 120 mg via EPIDURAL

## 2013-01-04 MED ORDER — IOHEXOL 180 MG/ML  SOLN
1.0000 mL | Freq: Once | INTRAMUSCULAR | Status: AC | PRN
  Administered 2013-01-04: 1 mL via EPIDURAL

## 2013-01-10 ENCOUNTER — Ambulatory Visit: Payer: Managed Care, Other (non HMO) | Admitting: Sports Medicine

## 2013-01-11 ENCOUNTER — Encounter: Payer: Self-pay | Admitting: Gastroenterology

## 2013-01-15 ENCOUNTER — Other Ambulatory Visit: Payer: Self-pay | Admitting: Family Medicine

## 2013-01-16 ENCOUNTER — Ambulatory Visit (INDEPENDENT_AMBULATORY_CARE_PROVIDER_SITE_OTHER): Payer: Managed Care, Other (non HMO) | Admitting: Sports Medicine

## 2013-01-16 ENCOUNTER — Encounter: Payer: Self-pay | Admitting: Sports Medicine

## 2013-01-16 VITALS — BP 131/76 | HR 90 | Wt 267.0 lb

## 2013-01-16 DIAGNOSIS — M48061 Spinal stenosis, lumbar region without neurogenic claudication: Secondary | ICD-10-CM

## 2013-01-16 MED ORDER — HYDROCODONE-ACETAMINOPHEN 7.5-325 MG PO TABS: 1.0000 | ORAL_TABLET | Freq: Three times a day (TID) | ORAL | Status: DC | PRN

## 2013-01-16 NOTE — Progress Notes (Signed)
  Subjective:    CC: Followup  HPI: Lumbar degenerative disc disease: Had a good response initially to a bilateral L2-L3 transforaminal epidural steroid injection, more recently we did a left-sided L5-S1 interlaminar epidural steroid injection, she had a much better response to this. Unfortunately she over did it and worsened slightly. Now she desires one month out of work, and a repeat left-sided L5-S1 interlaminar epidural injection. Symptoms are moderate, persistent with pain radiating down the left leg and L5 versus S1 distribution.  Past medical history, Surgical history, Family history not pertinant except as noted below, Social history, Allergies, and medications have been entered into the medical record, reviewed, and no changes needed.   Review of Systems: No fevers, chills, night sweats, weight loss, chest pain, or shortness of breath.   Objective:    General: Well Developed, well nourished, and in no acute distress.  Neuro: Alert and oriented x3, extra-ocular muscles intact, sensation grossly intact.  HEENT: Normocephalic, atraumatic, pupils equal round reactive to light, neck supple, no masses, no lymphadenopathy, thyroid nonpalpable.  Skin: Warm and dry, no rashes. Cardiac: Regular rate and rhythm, no murmurs rubs or gallops, no lower extremity edema.  Respiratory: Clear to auscultation bilaterally. Not using accessory muscles, speaking in full sentences.  Impression and Recommendations:

## 2013-01-16 NOTE — Assessment & Plan Note (Signed)
Good response to bilateral L2-L3 transforaminal epidural injections. Much better response to a left-sided L5-S1 interlaminar epidural steroid injection, fortunately she over did it, and has a recurrence of pain. Repeating a left-sided L5-S1 interlaminar epidural steroid injection, I have also filled out some FMLA paperwork to keep her out of work for about a month. Return to see me 2 weeks after her injection. Refilling narcotics.

## 2013-01-21 ENCOUNTER — Ambulatory Visit
Admission: RE | Admit: 2013-01-21 | Discharge: 2013-01-21 | Disposition: A | Discharge status: Home / Self Care | Payer: Managed Care, Other (non HMO) | Source: Ambulatory Visit | Attending: Sports Medicine | Admitting: Sports Medicine

## 2013-01-21 VITALS — BP 150/61 | HR 86

## 2013-01-21 DIAGNOSIS — E669 Obesity, unspecified: Secondary | ICD-10-CM

## 2013-01-21 MED ORDER — IOHEXOL 180 MG/ML  SOLN
1.0000 mL | Freq: Once | INTRAMUSCULAR | Status: AC | PRN
  Administered 2013-01-21: 1 mL via EPIDURAL

## 2013-01-21 MED ORDER — METHYLPREDNISOLONE ACETATE 40 MG/ML INJ SUSP (RADIOLOG
120.0000 mg | Freq: Once | INTRAMUSCULAR | Status: AC
  Administered 2013-01-21: 120 mg via EPIDURAL

## 2013-01-31 ENCOUNTER — Encounter: Payer: Self-pay | Admitting: Sports Medicine

## 2013-01-31 ENCOUNTER — Ambulatory Visit (INDEPENDENT_AMBULATORY_CARE_PROVIDER_SITE_OTHER): Payer: Managed Care, Other (non HMO) | Admitting: Sports Medicine

## 2013-01-31 VITALS — BP 138/72 | HR 115 | Wt 269.0 lb

## 2013-01-31 DIAGNOSIS — M48061 Spinal stenosis, lumbar region without neurogenic claudication: Secondary | ICD-10-CM

## 2013-01-31 MED ORDER — PREDNISONE 50 MG PO TABS: ORAL_TABLET | ORAL | Status: DC

## 2013-01-31 MED ORDER — OXYCODONE-ACETAMINOPHEN 10-325 MG PO TABS: 1.0000 | ORAL_TABLET | Freq: Three times a day (TID) | ORAL | Status: DC | PRN

## 2013-01-31 NOTE — Assessment & Plan Note (Signed)
This pleasant 62 year old female had a good response to bilateral L2-L3 transforaminal epidural injections. She had a much better response to left-sided L5-S1 intralaminar epidural injection, unfortunately her repeat left-sided L5-S1 intralaminar epidural steroid injection did not provide any response. I'm adding Percocet for pain, and I am going to refer her to Dr. Yevette Edwards with spine surgery. I'm also going to add some oral prednisone, her father was diagnosed with esophageal cancer, and she may have to go to [place] to be with him.

## 2013-01-31 NOTE — Progress Notes (Signed)
  Subjective:    CC: Follow up  HPI: This is a very pleasant 62 year old female with lumbar degenerative disc disease and spinal stenosis who comes in for followup after her epidural. She has had several epidurals, bilateral at the L2-L3 level which provided good response, she did have a left-sided L5-S1 interlaminar epidural which provided a tremendous response, temporarily. More recently she had a fourth epidural which was also on the left side at the L5-S1, interlaminar. Unfortunately his recent injection only provided her with about 2 days of improvement. She is in significant pain, with radiation down both legs, left worse than right. Of note her father has terminal cancer and she will be going to [place] sometime soon for a temporary stay.  Past medical history, Surgical history, Family history not pertinant except as noted below, Social history, Allergies, and medications have been entered into the medical record, reviewed, and no changes needed.   Review of Systems: No fevers, chills, night sweats, weight loss, chest pain, or shortness of breath.   Objective:    General: Well Developed, well nourished, and in no acute distress.  Neuro: Alert and oriented x3, extra-ocular muscles intact, sensation grossly intact.  HEENT: Normocephalic, atraumatic, pupils equal round reactive to light, neck supple, no masses, no lymphadenopathy, thyroid nonpalpable.  Skin: Warm and dry, no rashes. Cardiac: Regular rate and rhythm, no murmurs rubs or gallops, no lower extremity edema.  Respiratory: Clear to auscultation bilaterally. Not using accessory muscles, speaking in full sentences.  Impression and Recommendations:

## 2013-02-12 ENCOUNTER — Ambulatory Visit (AMBULATORY_SURGERY_CENTER): Payer: Self-pay | Admitting: *Deleted

## 2013-02-12 VITALS — Ht 65.0 in | Wt 267.4 lb

## 2013-02-12 DIAGNOSIS — Z86010 Personal history of colonic polyps: Secondary | ICD-10-CM

## 2013-02-12 MED ORDER — MOVIPREP 100 G PO SOLR
ORAL | Status: DC
Start: 2013-02-12 — End: 2013-10-17

## 2013-02-12 NOTE — Progress Notes (Signed)
No allergies to eggs or soy. No problems with anesthesia.  

## 2013-02-15 ENCOUNTER — Encounter: Payer: Self-pay | Admitting: Gastroenterology

## 2013-02-18 ENCOUNTER — Telehealth: Payer: Self-pay

## 2013-02-18 NOTE — Telephone Encounter (Signed)
#  60 Percocet given on November 20, it is too soon to refill, she also needs to followup with Dr. Yevette Edwards with spine surgery.

## 2013-02-18 NOTE — Telephone Encounter (Signed)
Patient request refill for Hydrocodone. Rhonda Cunningham,CMA  

## 2013-02-18 NOTE — Telephone Encounter (Signed)
Patient has been advised that it is too early to get a refill on Percocet. Deniese Oberry,CMA

## 2013-02-20 ENCOUNTER — Encounter: Payer: Self-pay | Admitting: Gastroenterology

## 2013-02-20 ENCOUNTER — Ambulatory Visit (AMBULATORY_SURGERY_CENTER): Payer: Managed Care, Other (non HMO) | Admitting: Gastroenterology

## 2013-02-20 VITALS — BP 125/70 | HR 78 | Temp 98.0°F | Resp 25 | Ht 65.0 in | Wt 267.0 lb

## 2013-02-20 DIAGNOSIS — Z86010 Personal history of colonic polyps: Secondary | ICD-10-CM

## 2013-02-20 DIAGNOSIS — D126 Benign neoplasm of colon, unspecified: Secondary | ICD-10-CM

## 2013-02-20 MED ORDER — SODIUM CHLORIDE 0.9 % IV SOLN: 500.0000 mL | INTRAVENOUS | Status: DC

## 2013-02-20 NOTE — Patient Instructions (Signed)
Discharge instructions given with verbal understanding. Handouts on polyps and hemorrhoids. Resume previous medications. YOU HAD AN ENDOSCOPIC PROCEDURE TODAY AT THE [facility]: Refer to the procedure report that was given to you for any specific questions about what was found during the examination.  If the procedure report does not answer your questions, please call your gastroenterologist to clarify.  If you requested that your care partner not be given the details of your procedure findings, then the procedure report has been included in a sealed envelope for you to review at your convenience later.  YOU SHOULD EXPECT: Some feelings of bloating in the abdomen. Passage of more gas than usual.  Walking can help get rid of the air that was put into your GI tract during the procedure and reduce the bloating. If you had a lower endoscopy (such as a colonoscopy or flexible sigmoidoscopy) you may notice spotting of blood in your stool or on the toilet paper. If you underwent a bowel prep for your procedure, then you may not have a normal bowel movement for a few days.  DIET: Your first meal following the procedure should be a light meal and then it is ok to progress to your normal diet.  A half-sandwich or bowl of soup is an example of a good first meal.  Heavy or fried foods are harder to digest and may make you feel nauseous or bloated.  Likewise meals heavy in dairy and vegetables can cause extra gas to form and this can also increase the bloating.  Drink plenty of fluids but you should avoid alcoholic beverages for 24 hours.  ACTIVITY: Your care partner should take you home directly after the procedure.  You should plan to take it easy, moving slowly for the rest of the day.  You can resume normal activity the day after the procedure however you should NOT DRIVE or use heavy machinery for 24 hours (because of the sedation medicines used during the test).    SYMPTOMS TO REPORT  IMMEDIATELY: A gastroenterologist can be reached at any hour.  During normal business hours, 8:30 AM to 5:00 PM Monday through Friday, call [phone].  After hours and on weekends, please call the GI answering service at [phone] who will take a message and have the physician on call contact you.   Following lower endoscopy (colonoscopy or flexible sigmoidoscopy):  Excessive amounts of blood in the stool  Significant tenderness or worsening of abdominal pains  Swelling of the abdomen that is new, acute  Fever of 100F or higher  FOLLOW UP: If any biopsies were taken you will be contacted by phone or by letter within the next 1-3 weeks.  Call your gastroenterologist if you have not heard about the biopsies in 3 weeks.  Our staff will call the home number listed on your records the next business day following your procedure to check on you and address any questions or concerns that you may have at that time regarding the information given to you following your procedure. This is a courtesy call and so if there is no answer at the home number and we have not heard from you through the emergency physician on call, we will assume that you have returned to your regular daily activities without incident.  SIGNATURES/CONFIDENTIALITY: You and/or your care partner have signed paperwork which will be entered into your electronic medical record.  These signatures attest to the fact that that the information above on your After Visit Summary   has been reviewed and is understood.  Full responsibility of the confidentiality of this discharge information lies with you and/or your care-partner. 

## 2013-02-20 NOTE — Progress Notes (Signed)
Patient did not experience any of the following events: a burn prior to discharge; a fall within the facility; wrong site/side/patient/procedure/implant event; or a hospital transfer or hospital admission upon discharge from the facility. (G8907) Patient did not have preoperative order for IV antibiotic SSI prophylaxis. (G8918)  

## 2013-02-20 NOTE — Progress Notes (Signed)
Called to room to assist during endoscopic procedure.  Patient ID and intended procedure confirmed with present staff. Received instructions for my participation in the procedure from the performing physician.  

## 2013-02-20 NOTE — Op Note (Signed)
[facility] [address] [place], [address]   COLONOSCOPY PROCEDURE REPORT  PATIENT: Kelly Roth, Kelly Roth  MR#: [id number] BIRTHDATE: 11-29-1950 , 61  yrs. old GENDER: Female ENDOSCOPIST: Meryl Dare, MD, [facility] PROCEDURE DATE:  02/20/2013 PROCEDURE:   Colonoscopy with snare polypectomy First Screening Colonoscopy - Avg.  risk and is 50 yrs.  old or older - No.  Prior Negative Screening - Now for repeat screening. N/A  History of Adenoma - Now for follow-up colonoscopy & has been > or = to 3 yrs.  Yes hx of adenoma.  Has been 3 or more years since last colonoscopy.  Polyps Removed Today? Yes. ASA CLASS:   Class II INDICATIONS:Patient's personal history of adenomatous colon polyps.  MEDICATIONS: MAC sedation, administered by CRNA and propofol (Diprivan) 200mg  IV DESCRIPTION OF PROCEDURE:   After the risks benefits and alternatives of the procedure were thoroughly explained, informed consent was obtained.  A digital rectal exam revealed no abnormalities of the rectum.   The LB [id number]  endoscope was introduced through the anus and advanced to the cecum, which was identified by both the appendix and ileocecal valve. No adverse events experienced.   The quality of the prep was excellent, using MoviPrep  The instrument was then slowly withdrawn as the colon was fully examined.  COLON FINDINGS: Two sessile polyps measuring 4-5 mm in size were found in the rectum.  A polypectomy was performed with a cold snare.  The resection was complete and the polyp tissue was completely retrieved.  The colon was otherwise normal. There was no diverticulosis, inflammation, polyps or cancers unless previously stated.  Retroflexed views revealed small internal hemorrhoids. The time to cecum=3 minutes 35 seconds. Withdrawal time=9 minutes 26 seconds. The scope was withdrawn and the procedure completed.  COMPLICATIONS: There were no complications.  ENDOSCOPIC  IMPRESSION: 1.   Two sessile polyps measuring 4-5 mm in the rectum; polypectomy performed with a cold snare 2.   Small internal hemorrhoids  RECOMMENDATIONS: 1.  Await pathology results 2.  Repeat Colonoscopy in 5 years.  eSigned:  Meryl Dare, MD, [facility] 02/20/2013 4:09 PM   cc: Nani Gasser, MD

## 2013-02-20 NOTE — Progress Notes (Signed)
Lidocaine-40mg IV prior to Propofol InductionPropofol given over incremental dosages 

## 2013-02-21 ENCOUNTER — Telehealth: Payer: Self-pay | Admitting: *Deleted

## 2013-02-21 NOTE — Telephone Encounter (Signed)
  Follow up Call-  Call back number 02/20/2013  Post procedure Call Back phone  # [phone]  Permission to leave phone message Yes     Patient questions:  Do you have a fever, pain , or abdominal swelling? no Pain Score  0 *  Have you tolerated food without any problems? yes  Have you been able to return to your normal activities? yes  Do you have any questions about your discharge instructions: Diet   no Medications  no Follow up visit  no  Do you have questions or concerns about your Care? no  Actions: * If pain score is 4 or above: No action needed, pain <4.

## 2013-02-26 ENCOUNTER — Telehealth: Payer: Self-pay

## 2013-02-26 ENCOUNTER — Encounter: Payer: Self-pay | Admitting: Gastroenterology

## 2013-02-26 NOTE — Telephone Encounter (Signed)
Patient request refill for Percocet. Advised patient that it was too early to fill. Kelly Roth,CMA

## 2013-02-26 NOTE — Telephone Encounter (Signed)
Please ask for whatever happened to the referral with Dr. Yevette Edwards with spine surgery. She would be up for a refill on the 20th.

## 2013-02-26 NOTE — Telephone Encounter (Signed)
Spoke to patient she stated that Dr. Yevette Edwards had referred her to have nerve conduction study done. Rhonda Cunningham,CMA

## 2013-03-01 ENCOUNTER — Telehealth: Payer: Self-pay

## 2013-03-01 DIAGNOSIS — M48061 Spinal stenosis, lumbar region without neurogenic claudication: Secondary | ICD-10-CM

## 2013-03-01 MED ORDER — OXYCODONE-ACETAMINOPHEN 10-325 MG PO TABS: 1.0000 | ORAL_TABLET | Freq: Three times a day (TID) | ORAL | Status: DC | PRN

## 2013-03-01 NOTE — Telephone Encounter (Signed)
Rx in box. 

## 2013-03-01 NOTE — Telephone Encounter (Signed)
Patient request refill for Oxycodone. Rhonda Cunningham,CMA  

## 2013-03-01 NOTE — Telephone Encounter (Signed)
Patient has been informed that Oxycodone was ready for pick up. Rondall Radigan,CMA

## 2013-03-15 ENCOUNTER — Telehealth: Payer: Self-pay

## 2013-03-15 ENCOUNTER — Encounter: Payer: Self-pay | Admitting: Sports Medicine

## 2013-03-15 NOTE — Telephone Encounter (Signed)
Patient called a requested another written letter for leave of absence until June 25, 2013. Rhonda Cunningham,CMA

## 2013-03-15 NOTE — Telephone Encounter (Signed)
Spoke to patient advised her that letter was ready to be picked up from the office. Allan Bacigalupi,CMA

## 2013-03-15 NOTE — Telephone Encounter (Signed)
Letter is in my box 

## 2013-03-15 NOTE — Telephone Encounter (Signed)
Please ser note below. Rhonda Cunningham,CMA

## 2013-03-19 ENCOUNTER — Other Ambulatory Visit: Payer: Self-pay | Admitting: Sports Medicine

## 2013-03-19 MED ORDER — GABAPENTIN 600 MG PO TABS: 600.0000 mg | ORAL_TABLET | Freq: Three times a day (TID) | ORAL | Status: DC

## 2013-03-20 ENCOUNTER — Other Ambulatory Visit: Payer: Self-pay | Admitting: Orthopedic Surgery

## 2013-03-20 DIAGNOSIS — M545 Low back pain, unspecified: Secondary | ICD-10-CM

## 2013-03-29 ENCOUNTER — Ambulatory Visit
Admission: RE | Admit: 2013-03-29 | Discharge: 2013-03-29 | Disposition: A | Discharge status: Home / Self Care | Payer: Managed Care, Other (non HMO) | Source: Ambulatory Visit | Attending: Orthopedic Surgery | Admitting: Orthopedic Surgery

## 2013-03-29 DIAGNOSIS — M545 Low back pain, unspecified: Secondary | ICD-10-CM

## 2013-04-01 ENCOUNTER — Telehealth: Payer: Self-pay

## 2013-04-01 DIAGNOSIS — M48061 Spinal stenosis, lumbar region without neurogenic claudication: Secondary | ICD-10-CM

## 2013-04-01 NOTE — Telephone Encounter (Signed)
Patient request refill for Hydrocodone. Rhonda Cunningham,CMA  

## 2013-04-02 MED ORDER — HYDROCODONE-ACETAMINOPHEN 7.5-325 MG PO TABS: 1.0000 | ORAL_TABLET | Freq: Two times a day (BID) | ORAL | Status: DC | PRN

## 2013-04-02 NOTE — Telephone Encounter (Signed)
Patient has called multiple times concerning a refill request for hydrocodone. Patient was advised that it could take up to 48 hours to get a response on whether the Rx would be approved. Rhonda Cunningham,CMA

## 2013-04-02 NOTE — Telephone Encounter (Signed)
Please see note below. Kelly Roth,CMA  

## 2013-04-02 NOTE — Telephone Encounter (Signed)
Refilled x1, no further narcotics.  Needs to f/u with Dr. Lynann Bologna.

## 2013-04-03 NOTE — Telephone Encounter (Signed)
Called patient left message on patient vm that Rx Hydrocodone was ready to pick up at the office. Rhonda Cunningham,CMA

## 2013-04-15 ENCOUNTER — Encounter: Payer: Self-pay | Admitting: Family Medicine

## 2013-04-19 ENCOUNTER — Other Ambulatory Visit: Payer: Self-pay | Admitting: Family Medicine

## 2013-06-13 ENCOUNTER — Other Ambulatory Visit: Payer: Self-pay | Admitting: Family Medicine

## 2013-07-17 ENCOUNTER — Other Ambulatory Visit: Payer: Self-pay | Admitting: Family Medicine

## 2013-07-18 ENCOUNTER — Other Ambulatory Visit: Payer: Self-pay | Admitting: Family Medicine

## 2013-09-15 ENCOUNTER — Other Ambulatory Visit: Payer: Self-pay | Admitting: Family Medicine

## 2013-09-16 ENCOUNTER — Other Ambulatory Visit: Payer: Self-pay | Admitting: *Deleted

## 2013-09-16 MED ORDER — ALBUTEROL SULFATE HFA 108 (90 BASE) MCG/ACT IN AERS: 2.0000 | INHALATION_SPRAY | Freq: Four times a day (QID) | RESPIRATORY_TRACT | Status: DC | PRN

## 2013-09-16 NOTE — Telephone Encounter (Signed)
Refill sent to pharmacy. Margette Fast, CMA

## 2013-10-16 ENCOUNTER — Other Ambulatory Visit: Payer: Self-pay | Admitting: Family Medicine

## 2013-10-17 ENCOUNTER — Ambulatory Visit (INDEPENDENT_AMBULATORY_CARE_PROVIDER_SITE_OTHER): Payer: Managed Care, Other (non HMO) | Admitting: Family Medicine

## 2013-10-17 ENCOUNTER — Encounter: Payer: Self-pay | Admitting: Family Medicine

## 2013-10-17 VITALS — BP 122/60 | HR 56 | Temp 97.9°F | Wt 293.0 lb

## 2013-10-17 DIAGNOSIS — I8310 Varicose veins of unspecified lower extremity with inflammation: Secondary | ICD-10-CM

## 2013-10-17 DIAGNOSIS — R609 Edema, unspecified: Secondary | ICD-10-CM

## 2013-10-17 DIAGNOSIS — I872 Venous insufficiency (chronic) (peripheral): Secondary | ICD-10-CM

## 2013-10-17 MED ORDER — TRIAMCINOLONE ACETONIDE 0.1 % EX CREA: TOPICAL_CREAM | CUTANEOUS | Status: DC

## 2013-10-17 MED ORDER — FUROSEMIDE 40 MG PO TABS: ORAL_TABLET | ORAL | Status: DC

## 2013-10-17 NOTE — Progress Notes (Signed)
CC: Kelly Roth is a 63 y.o. female is here for Leg Swelling   Subjective: HPI:  Patient complaint of bilateral leg swelling has been present for the past 4 weeks but worse over the past week. Now severe in severity. Accompanied by blistering and weeping in both legs. Symptoms are greatly improved with elevation of the legs however within one hour after lowering the legs symptoms become severe. Accompanied by mild discomfort described as a stretching sensation of the skin. Other the above nothing else makes better or worse. No changes in medications recently. She denies asymmetric swelling nor any swelling elsewhere in the body. Denies fevers, chills, flushing, nausea, nor overlying skin changes other than that described above. Denies orthopnea, irregular heartbeat, chest pain or shortness of breath   Review Of Systems Outlined In HPI  Past Medical History  Diagnosis Date  . Arthritis   . Osteoarthritis   . Hyperlipidemia   . Hemorrhoids   . GERD (gastroesophageal reflux disease)   . Adenomatous polyps 2004     Colonoscopy done in [facility]  . Hypertension   . Back pain     Past Surgical History  Procedure Laterality Date  . Left knee replacement  3-07    right on 2-06  . Cholecystectomy  05-08-07  . Multiple tooth extractions     Family History  Problem Relation Age of Onset  . Depression Mother   . Hypertension Mother   . Hyperlipidemia Mother   . Cancer Mother     lung  . Hyperlipidemia Father   . Hypertension Father   . Colon cancer Neg Hx     Neg     History   Social History  . Marital Status: Married    Spouse Name: N/A    Number of Children: N/A  . Years of Education: N/A   Occupational History  . Not on file.   Social History Main Topics  . Smoking status: Current Every Day Smoker -- 1.00 packs/day  . Smokeless tobacco: Never Used  . Alcohol Use: 0.0 oz/week     Comment: one drink a week   . Drug Use: No  . Sexual Activity: Not on file    Other Topics Concern  . Not on file   Social History Narrative  . No narrative on file     Objective: BP 122/60  Pulse 56  Temp(Src) 97.9 F (36.6 C) (Oral)  Wt 293 lb (132.904 kg)  SpO2 97%  General: Alert and Oriented, No Acute Distress HEENT: Pupils equal, round, reactive to light. Conjunctivae clear.  Moist mucous membranes pharynx unremarkable Lungs: Clear to auscultation bilaterally, no wheezing/ronchi/rales.  Comfortable work of breathing. Good air movement. Cardiac: Regular rate and rhythm. Normal S1/S2.  No murmurs, rubs, nor gallops.  . Extremities: 2+ pitting edema in both legs from the toes proximally up to the level of the tibial tuberosity.  Strong peripheral pulses.  Mental Status: No depression, anxiety, nor agitation. Skin: Warm and dry. Overlying the areas of edema there is moderate erythema with papules but no warmth compared other appendages  Assessment & Plan: [name] was seen today for leg swelling.  Diagnoses and associated orders for this visit:  Venous stasis dermatitis of both lower extremities - triamcinolone cream (KENALOG) 0.1 %; Apply to affected areas twice a day for up to two weeks, avoid face.  Edema - COMPLETE METABOLIC PANEL WITH GFR - furosemide (LASIX) 40 MG tablet; Half to full tab twice a day as needed for  leg swelling.    Edema: Most likely venous insufficiency, Check electrolytes and most importantly renal function. If normal start as needed Lasix. I've advised her to wear compression stockings to help prevent edema. She tells me she R. he has some at home. Discussed that we could always refer her to vascular surgery if desired, she declines today. Venous stasis dermatitis: Triamcinolone cream as needed to the erythematous patches on the legs   Return in about 2 days (around 10/19/2013) for Leg swelling followup with PCP.

## 2013-10-17 NOTE — Patient Instructions (Signed)

## 2013-10-18 LAB — COMPLETE METABOLIC PANEL WITH GFR
ALBUMIN: 4.1 g/dL (ref 3.5–5.2)
ALT: 29 U/L (ref 0–35)
AST: 18 U/L (ref 0–37)
Alkaline Phosphatase: 88 U/L (ref 39–117)
BUN: 12 mg/dL (ref 6–23)
CALCIUM: 10 mg/dL (ref 8.4–10.5)
CHLORIDE: 101 meq/L (ref 96–112)
CO2: 26 mEq/L (ref 19–32)
Creat: 0.6 mg/dL (ref 0.50–1.10)
GFR, Est African American: >89 mL/min
GLUCOSE: 115 mg/dL — AB (ref 70–99)
POTASSIUM: 4.1 meq/L (ref 3.5–5.3)
Sodium: 138 mEq/L (ref 135–145)
Total Bilirubin: 0.9 mg/dL (ref 0.2–1.2)
Total Protein: 6.1 g/dL (ref 6.0–8.3)

## 2013-10-31 ENCOUNTER — Telehealth: Payer: Self-pay | Admitting: *Deleted

## 2013-10-31 ENCOUNTER — Encounter: Payer: Self-pay | Admitting: Family Medicine

## 2013-10-31 ENCOUNTER — Ambulatory Visit (INDEPENDENT_AMBULATORY_CARE_PROVIDER_SITE_OTHER): Payer: Managed Care, Other (non HMO)

## 2013-10-31 ENCOUNTER — Ambulatory Visit (INDEPENDENT_AMBULATORY_CARE_PROVIDER_SITE_OTHER): Payer: Managed Care, Other (non HMO) | Admitting: Family Medicine

## 2013-10-31 VITALS — BP 120/63 | HR 57 | Wt 286.0 lb

## 2013-10-31 DIAGNOSIS — L02419 Cutaneous abscess of limb, unspecified: Secondary | ICD-10-CM

## 2013-10-31 DIAGNOSIS — R609 Edema, unspecified: Secondary | ICD-10-CM

## 2013-10-31 DIAGNOSIS — L03119 Cellulitis of unspecified part of limb: Secondary | ICD-10-CM

## 2013-10-31 DIAGNOSIS — R0602 Shortness of breath: Secondary | ICD-10-CM

## 2013-10-31 DIAGNOSIS — R600 Localized edema: Secondary | ICD-10-CM

## 2013-10-31 MED ORDER — CEPHALEXIN 500 MG PO CAPS: 500.0000 mg | ORAL_CAPSULE | Freq: Four times a day (QID) | ORAL | Status: DC

## 2013-10-31 MED ORDER — FUROSEMIDE 10 MG/ML IJ SOLN
40.0000 mg | Freq: Once | INTRAMUSCULAR | Status: AC
  Administered 2013-10-31: 40 mg via INTRAMUSCULAR

## 2013-10-31 NOTE — Telephone Encounter (Signed)
Pt was informed that Dr. Madilyn Fireman could not complete her FMLA forms for extended leave. I informed her that she would write her a work note for a short period of time for the issue that she was seen for today in the office. She informed me that she has made numerous attempts to contact Dr. Hardin Negus office to get an appt to be seen for pain management and has not heard back from their office. She stated that her fmla has ran out and that her insurance benefits will be running out soon and she will need an extension made to her fmla. She told me that Dr. Dianah Field told her about a pain management clinic here in [place] that is closer to her home that she would perfer to go to and would like to be seen there instead. I looked back at the note that was sent from Dr. Lynann Bologna and he informed pt that he would be happy to send a referral elsewhere if she wanted. I told her that she should call his office and inform him of what has happened since her last OV with him and ask that he send the referral to the pain clinic that is here in [place] and ask that this be done immediately because of her circumstance. She asked whether or not if Dr. Dianah Field would be willing to sign off on her fmla forms since he has treated her for her back issues. I told her that I would forward this note to him however, I could not guarantee that he would do this sine he had referred her to Dr. Lynann Bologna for her back. I will forward to Dr. Dianah Field for advice.Audelia Hives [place]

## 2013-10-31 NOTE — Patient Instructions (Signed)
Stop Lyrica and Naproxen.

## 2013-10-31 NOTE — Progress Notes (Addendum)
Subjective:    Patient ID: Kelly Roth, female    DOB: Jul 25, 1950, 63 y.o.   MRN: [id number]  HPI Started noticing LE swelling for about 2 months after staring lyrica.  Says held her med for 2 days and says swelling was better.  No fever.  + chills.  + SOB when lays flat.  Has been sleepin gin her lounge chair fo 2 months.  Has been getting water blisters on her left leg.  Hx o chronic back pain.   No CP.  Has been SOB with acitivity. She's also had an intermittent cough for about the last 2 weeks with yellow productive sputum. No shortness of breath at rest. History of hypertension and hyperlipidemia  Review of Systems     Objective:   Physical Exam  Constitutional: She is oriented to person, place, and time. She appears well-developed and well-nourished.  HENT:  Head: Normocephalic and atraumatic.  Right Ear: External ear normal.  Left Ear: External ear normal.  Nose: Nose normal.  Mouth/Throat: Oropharynx is clear and moist.  TMs and canals are clear.   Eyes: Conjunctivae and EOM are normal. Pupils are equal, round, and reactive to light.  Neck: Neck supple. No thyromegaly present.  Cardiovascular: Normal rate, regular rhythm and normal heart sounds.   Pulmonary/Chest: Effort normal and breath sounds normal. She has no wheezes.  Musculoskeletal: She exhibits edema.  1-2+ pitting edema bilaterally. Little but worse on the left compared to the right. She has significant dry skin and scale on both lower extremities and both are quite erythematous.  Lymphadenopathy:    She has no cervical adenopathy.  Neurological: She is alert and oriented to person, place, and time.  Skin: Skin is warm and dry.  Psychiatric: She has a normal mood and affect.          Assessment & Plan:  SOB- unclear etiology at this point. She has had a cough with yellow productive sputum so it could be bronchitis versus pneumonia versus fluid overload since she has significant lower extremity edema  that started about 2 months ago. Will get labs in chest x-ray today for further evaluation. She is a heavy long-term smoker.  Lower extremity edema x2 months-I suspect this is from her Lyrica. I do think we should do further evaluation to rule out thyroid disorder as well as even considering a diagnosis of congestive heart failure. We'll have her stop her Lyrica for now. Continue to just use her hydrocodone for now. Also avoid the naproxen as well. Given IM 40 mg Lasix here in the office today. Wants to get her labs back etc. we might be able to start her on a diuretic to get the swelling under control and told the Lyrica gets out of her system.  Cellulitis of both lower extreme is-we'll go ahead and start her on Keflex. Fairly low risk for MRSA but if not improving will change the regimen. She's not allergic to any antibiotics at this point.  Note, she is also requesting that I write her out of work until November for her back pain. She will need to get these forms completed information from her orthopedist. Evidently they're referring her to pain management she does not have an appointment yet. I do not have the notes and details to justify her absence from work at this point in time. I'll be happy to provide a work note for her current health status for today and tomorrow to return on Monday.  EKG today  shows normal sinus rhythm with frequent PVCs. There are 3 on the EKG. Normal axis. Possible left atrial enlargement. Possible old anteroseptal infarct with Q wave in V1 and V2 and V3,

## 2013-10-31 NOTE — Telephone Encounter (Signed)
I will sign off for a limited additional leave, and she would need to do this in an office visit.

## 2013-11-01 ENCOUNTER — Ambulatory Visit (INDEPENDENT_AMBULATORY_CARE_PROVIDER_SITE_OTHER): Payer: Managed Care, Other (non HMO) | Admitting: Family Medicine

## 2013-11-01 VITALS — BP 108/62 | HR 54 | Wt 285.0 lb

## 2013-11-01 DIAGNOSIS — R0902 Hypoxemia: Secondary | ICD-10-CM

## 2013-11-01 LAB — COMPLETE METABOLIC PANEL WITH GFR
ALK PHOS: 91 U/L (ref 39–117)
ALT: 24 U/L (ref 0–35)
AST: 16 U/L (ref 0–37)
Albumin: 4.3 g/dL (ref 3.5–5.2)
BILIRUBIN TOTAL: 0.9 mg/dL (ref 0.2–1.2)
BUN: 13 mg/dL (ref 6–23)
CO2: 30 mEq/L (ref 19–32)
CREATININE: 0.66 mg/dL (ref 0.50–1.10)
Calcium: 9.7 mg/dL (ref 8.4–10.5)
Chloride: 96 mEq/L (ref 96–112)
GFR, Est African American: >89 mL/min
GFR, Est Non African American: >89 mL/min
Glucose, Bld: 112 mg/dL — ABNORMAL HIGH (ref 70–99)
Potassium: 4 mEq/L (ref 3.5–5.3)
Sodium: 137 mEq/L (ref 135–145)
Total Protein: 6.5 g/dL (ref 6.0–8.3)

## 2013-11-01 LAB — CBC WITH DIFFERENTIAL/PLATELET
BASOS ABS: 0 10*3/uL (ref 0.0–0.1)
BASOS PCT: 0 % (ref 0–1)
EOS PCT: 1 % (ref 0–5)
Eosinophils Absolute: 0.1 10*3/uL (ref 0.0–0.7)
HCT: 46.9 % — ABNORMAL HIGH (ref 36.0–46.0)
Hemoglobin: 15.6 g/dL — ABNORMAL HIGH (ref 12.0–15.0)
LYMPHS PCT: 17 % (ref 12–46)
Lymphs Abs: 1.8 10*3/uL (ref 0.7–4.0)
MCH: 31.2 pg (ref 26.0–34.0)
MCHC: 33.3 g/dL (ref 30.0–36.0)
MCV: 93.8 fL (ref 78.0–100.0)
Monocytes Absolute: 0.8 10*3/uL (ref 0.1–1.0)
Monocytes Relative: 8 % (ref 3–12)
Neutro Abs: 7.7 10*3/uL (ref 1.7–7.7)
Neutrophils Relative %: 74 % (ref 43–77)
PLATELETS: 169 10*3/uL (ref 150–400)
RBC: 5 MIL/uL (ref 3.87–5.11)
RDW: 14.4 % (ref 11.5–15.5)
WBC: 10.4 10*3/uL (ref 4.0–10.5)

## 2013-11-01 LAB — BRAIN NATRIURETIC PEPTIDE: Brain Natriuretic Peptide: 144.1 pg/mL — ABNORMAL HIGH (ref 0.0–100.0)

## 2013-11-01 LAB — TSH: TSH: 1.949 u[IU]/mL (ref 0.350–4.500)

## 2013-11-01 NOTE — Progress Notes (Signed)
   Subjective:    Patient ID: Kelly Roth, female    DOB: 1951-01-15, 63 y.o.   MRN: [id number]  HPI  Kelly Roth reports she has noticed a decrease in her swelling. She states she did stop taking the Lyrica and naproxen.   Review of Systems     Objective:   Physical Exam        Assessment & Plan:  Oxygen level is up from 88% to 92%.  Holding the meds should help. Use lasix prn.  Beatrice Lecher, MD

## 2013-11-01 NOTE — Progress Notes (Signed)
   Subjective:    Patient ID: Kelly Roth, female    DOB: 1951/02/12, 63 y.o.   MRN: [id number]  HPI    Review of Systems     Objective:   Physical Exam        Assessment & Plan:

## 2013-11-01 NOTE — Telephone Encounter (Signed)
Called and informed pt of Dr. Landry Corporal recommendations.Kelly Roth [place]

## 2013-11-04 ENCOUNTER — Other Ambulatory Visit: Payer: Self-pay | Admitting: Family Medicine

## 2013-11-04 ENCOUNTER — Telehealth: Payer: Self-pay | Admitting: Family Medicine

## 2013-11-04 NOTE — Addendum Note (Signed)
Addended by: Beatrice Lecher D on: 11/04/2013 08:34 AM   Modules accepted: Level of Service

## 2013-11-04 NOTE — Telephone Encounter (Signed)
Called and pt and she reports that it was about 15 yrs ago and cannot recall the name of the facility it was done in.Kelly Roth

## 2013-11-04 NOTE — Telephone Encounter (Signed)
Has she had an EKG performed Kelly L. so that we can compare to her recent one. We did not have any old ones on file for her.

## 2013-11-07 ENCOUNTER — Encounter: Payer: Self-pay | Admitting: Sports Medicine

## 2013-11-07 ENCOUNTER — Ambulatory Visit (INDEPENDENT_AMBULATORY_CARE_PROVIDER_SITE_OTHER): Payer: Managed Care, Other (non HMO) | Admitting: Sports Medicine

## 2013-11-07 VITALS — BP 104/50 | HR 51 | Ht 64.0 in | Wt 285.0 lb

## 2013-11-07 DIAGNOSIS — M48061 Spinal stenosis, lumbar region without neurogenic claudication: Secondary | ICD-10-CM

## 2013-11-07 NOTE — Assessment & Plan Note (Signed)
Kelly Roth has been through a lot, she has been 2 months of formal physical therapy, multiple epidurals, the most effective of which were at the left side of the L5-S1 level and transforaminal. She's been to multiple neuropathic agents, she has seen Dr. 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 At this point we are going to proceed with pain management, she does well with hydrocodone/acetaminophen, she does not request early refills, is more functional and her pain is well controlled with this. For this reason I would like her to see Dr. Garen Grams, she has been unsuccessful in getting an appointment with Dr. Hardin Negus in [place]. I think he would be the right person for both pharmacologic and subsequent interventional pain management.

## 2013-11-07 NOTE — Progress Notes (Signed)
  Subjective:    CC: Followup  HPI: Kelly Roth is a very pleasant 63 year old female multilevel lumbar degenerative disc disease, symptoms have been referable to the left L5 nerve root, and she has been through several months of physical therapy, multiple neuropathic agents, epidurals at the left L5-S1, transforaminal had been temporarily effective. She did see Dr. Lynann Bologna with spine surgery who thought that the anatomic pathology was not severe enough for surgical intervention. She has done well with an occasional Norco, does not request for refills, but has not been able to get into a pain management physician in [place] that she was referred to by Dr. Lynann Bologna. She is here for further evaluation and further bites. Symptoms are moderate, persistent, and she continues to have back pain radiating down the left leg in an L5 distribution.  Past medical history, Surgical history, Family history not pertinant except as noted below, Social history, Allergies, and medications have been entered into the medical record, reviewed, and no changes needed.   Review of Systems: No fevers, chills, night sweats, weight loss, chest pain, or shortness of breath.   Objective:    General: Well Developed, well nourished, and in no acute distress.  Neuro: Alert and oriented x3, extra-ocular muscles intact, sensation grossly intact.  HEENT: Normocephalic, atraumatic, pupils equal round reactive to light, neck supple, no masses, no lymphadenopathy, thyroid nonpalpable.  Skin: Warm and dry, no rashes. Cardiac: Regular rate and rhythm, no murmurs rubs or gallops, no lower extremity edema.  Respiratory: Clear to auscultation bilaterally. Not using accessory muscles, speaking in full sentences.  Impression and Recommendations:

## 2013-11-10 ENCOUNTER — Other Ambulatory Visit: Payer: Self-pay | Admitting: Family Medicine

## 2013-11-13 ENCOUNTER — Other Ambulatory Visit: Payer: Self-pay | Admitting: Family Medicine

## 2013-11-14 ENCOUNTER — Ambulatory Visit (INDEPENDENT_AMBULATORY_CARE_PROVIDER_SITE_OTHER): Payer: Managed Care, Other (non HMO) | Admitting: Family Medicine

## 2013-11-14 ENCOUNTER — Encounter: Payer: Self-pay | Admitting: Family Medicine

## 2013-11-14 VITALS — BP 141/77 | HR 86 | Wt 284.0 lb

## 2013-11-14 DIAGNOSIS — R0602 Shortness of breath: Secondary | ICD-10-CM

## 2013-11-14 DIAGNOSIS — F17200 Nicotine dependence, unspecified, uncomplicated: Secondary | ICD-10-CM

## 2013-11-14 DIAGNOSIS — R600 Localized edema: Secondary | ICD-10-CM

## 2013-11-14 DIAGNOSIS — R609 Edema, unspecified: Secondary | ICD-10-CM

## 2013-11-14 DIAGNOSIS — Z720 Tobacco use: Secondary | ICD-10-CM

## 2013-11-14 MED ORDER — PREDNISONE 20 MG PO TABS: 40.0000 mg | ORAL_TABLET | Freq: Every day | ORAL | Status: DC

## 2013-11-14 MED ORDER — ALBUTEROL SULFATE (2.5 MG/3ML) 0.083% IN NEBU
2.5000 mg | INHALATION_SOLUTION | Freq: Once | RESPIRATORY_TRACT | Status: AC
  Administered 2013-11-14: 2.5 mg via RESPIRATORY_TRACT

## 2013-11-14 NOTE — Patient Instructions (Signed)
Limit all fluid to about to about 65 ounces per day.  Change the lasix 2 tabs at one time morning.

## 2013-11-14 NOTE — Progress Notes (Signed)
   Subjective:    Patient ID: Kelly Roth, female    DOB: February 22, 1951, 63 y.o.   MRN: [id number]  HPI Edema - here for follow up edema. Some better ovreall but legs are still red. She says she can actually bend her toes now. We felt like it was most likely secondary to her Lyrica. So she had stopped it, about 2 weeks ago. We gave her IM Lasix in the office. She was also started on Keflex for cellulitis of both lower extremities. Says  40mg  of lasix didn't really seem to help. Says 2 pills works Better. She probably drinks about 80 ounces of fluid per day. Typically coffee occasionally some water and sometimes about a liter and a half of diet soda.  SOB - Says she feels really SOB, wheezing with acitivity.  Yellow sputum is better, Much improved. SOB in the office today.  She says that she feels like some of her shortness of breath is related to her pain. When her pain really hits most takes her breath away and makes it difficult for her to breathe. She has been using her albuterol twice a day pretty consistently.   Review of Systems     Objective:   Physical Exam  Constitutional: She is oriented to person, place, and time. She appears well-developed and well-nourished.  HENT:  Head: Normocephalic and atraumatic.  Cardiovascular: Normal rate, regular rhythm and normal heart sounds.   Pulmonary/Chest: Effort normal and breath sounds normal.  Neurological: She is alert and oriented to person, place, and time.  Skin: Skin is warm and dry.  Psychiatric: She has a normal mood and affect. Her behavior is normal.          Assessment & Plan:  Edema- mildly improved with increased her voice today. We discussed limiting fluids to about 65 ounces per day. Discussed importance of avoiding any kind of diet drinks and start him. They also typically have salt in them which can help her retain fluid and they are back for your bones. Encouraged to drink more water but to limit to 65 ounces per  day. We will have her take her Lasix 2 tabs at a time in the morning and can take 2 tabs in the afternoon as needed for swelling. We'll recheck her BMP at followup in one month.  Shortness of breath-I. feel like she is developing COPD. With her heavy smoking history and history of recurrent bronchitis I recommend spirometry for further evaluation. I would like to see her back in one to 2 weeks but she says she'll be out of town so we will have her followup in about 4 weeks. She might benefit from inhaled long-acting albuterol or corticosteroid but I would like to get a baseline spirometry on her. In the short-term will go ahead and give her 5 days of prednisone. Since her cough and sputum production has improved we'll hold off on repeating another antibiotic.        Can also increase her albuterol use in the next couple days if she starts to feel better then decrease back down to twice a day.  Tobacco abuse-encourage cessation. She says she's been trying to cut back on her own. Encouraged her to continue to work at it. Ultimately she needs to quit completely.

## 2013-12-12 ENCOUNTER — Other Ambulatory Visit: Payer: Self-pay | Admitting: *Deleted

## 2013-12-12 MED ORDER — FUROSEMIDE 40 MG PO TABS: ORAL_TABLET | ORAL | Status: DC

## 2014-01-11 ENCOUNTER — Other Ambulatory Visit: Payer: Self-pay | Admitting: Family Medicine

## 2014-02-07 ENCOUNTER — Other Ambulatory Visit: Payer: Self-pay | Admitting: Family Medicine

## 2014-02-23 ENCOUNTER — Other Ambulatory Visit: Payer: Self-pay | Admitting: Family Medicine

## 2014-02-23 ENCOUNTER — Other Ambulatory Visit: Payer: Self-pay | Admitting: Sports Medicine

## 2014-03-26 ENCOUNTER — Encounter: Payer: Self-pay | Admitting: Family Medicine

## 2014-03-26 ENCOUNTER — Ambulatory Visit (INDEPENDENT_AMBULATORY_CARE_PROVIDER_SITE_OTHER): Payer: BLUE CROSS/BLUE SHIELD | Admitting: Family Medicine

## 2014-03-26 VITALS — BP 127/60 | HR 65 | Ht 64.0 in | Wt 265.0 lb

## 2014-03-26 DIAGNOSIS — Z0000 Encounter for general adult medical examination without abnormal findings: Secondary | ICD-10-CM | POA: Diagnosis not present

## 2014-03-26 DIAGNOSIS — Z720 Tobacco use: Secondary | ICD-10-CM

## 2014-03-26 DIAGNOSIS — B351 Tinea unguium: Secondary | ICD-10-CM | POA: Diagnosis not present

## 2014-03-26 DIAGNOSIS — Z23 Encounter for immunization: Secondary | ICD-10-CM

## 2014-03-26 DIAGNOSIS — E8881 Metabolic syndrome: Secondary | ICD-10-CM | POA: Diagnosis not present

## 2014-03-26 LAB — BASIC METABOLIC PANEL WITH GFR
BUN: 10 mg/dL (ref 6–23)
CHLORIDE: 102 meq/L (ref 96–112)
CO2: 28 mEq/L (ref 19–32)
Calcium: 10 mg/dL (ref 8.4–10.5)
Creat: 0.57 mg/dL (ref 0.50–1.10)
GFR, Est African American: >89 mL/min
GFR, Est Non African American: >89 mL/min
Glucose, Bld: 72 mg/dL (ref 70–99)
POTASSIUM: 3.9 meq/L (ref 3.5–5.3)
Sodium: 139 mEq/L (ref 135–145)

## 2014-03-26 LAB — POCT GLYCOSYLATED HEMOGLOBIN (HGB A1C): HEMOGLOBIN A1C: 6.1

## 2014-03-26 MED ORDER — PIROXICAM 10 MG PO CAPS: ORAL_CAPSULE | ORAL | Status: DC

## 2014-03-26 MED ORDER — LISINOPRIL-HYDROCHLOROTHIAZIDE 20-12.5 MG PO TABS: 1.0000 | ORAL_TABLET | Freq: Every day | ORAL | Status: DC

## 2014-03-26 MED ORDER — VARENICLINE TARTRATE 0.5 MG X 11 & 1 MG X 42 PO MISC: ORAL | Status: DC

## 2014-03-26 NOTE — Patient Instructions (Addendum)
Keep up a regular exercise program and make sure you are eating a healthy diet Try to eat 4 servings of dairy a day, or if you are lactose intolerant take a calcium with vitamin D daily.  Your vaccines are up to date.  Smoking Cessation Quitting smoking is important to your health and has many advantages. However, it is not always easy to quit since nicotine is a very addictive drug. Oftentimes, people try 3 times or more before being able to quit. This document explains the best ways for you to prepare to quit smoking. Quitting takes hard work and a lot of effort, but you can do it. ADVANTAGES OF QUITTING SMOKING  You will live longer, feel better, and live better.  Your body will feel the impact of quitting smoking almost immediately.  Within 20 minutes, blood pressure decreases. Your pulse returns to its normal level.  After 8 hours, carbon monoxide levels in the blood return to normal. Your oxygen level increases.  After 24 hours, the chance of having a heart attack starts to decrease. Your breath, hair, and body stop smelling like smoke.  After 48 hours, damaged nerve endings begin to recover. Your sense of taste and smell improve.  After 72 hours, the body is virtually free of nicotine. Your bronchial tubes relax and breathing becomes easier.  After 2 to 12 weeks, lungs can hold more air. Exercise becomes easier and circulation improves.  The risk of having a heart attack, stroke, cancer, or lung disease is greatly reduced.  After 1 year, the risk of coronary heart disease is cut in half.  After 5 years, the risk of stroke falls to the same as a nonsmoker.  After 10 years, the risk of lung cancer is cut in half and the risk of other cancers decreases significantly.  After 15 years, the risk of coronary heart disease drops, usually to the level of a nonsmoker.  If you are pregnant, quitting smoking will improve your chances of having a healthy baby.  The people you live with,  especially any children, will be healthier.  You will have extra money to spend on things other than cigarettes. QUESTIONS TO THINK ABOUT BEFORE ATTEMPTING TO QUIT You may want to talk about your answers with your health care provider.  Why do you want to quit?  If you tried to quit in the past, what helped and what did not?  What will be the most difficult situations for you after you quit? How will you plan to handle them?  Who can help you through the tough times? Your family? Friends? A health care provider?  What pleasures do you get from smoking? What ways can you still get pleasure if you quit? Here are some questions to ask your health care provider:  How can you help me to be successful at quitting?  What medicine do you think would be best for me and how should I take it?  What should I do if I need more help?  What is smoking withdrawal like? How can I get information on withdrawal? GET READY  Set a quit date.  Change your environment by getting rid of all cigarettes, ashtrays, matches, and lighters in your home, car, or work. Do not let people smoke in your home.  Review your past attempts to quit. Think about what worked and what did not. GET SUPPORT AND ENCOURAGEMENT You have a better chance of being successful if you have help. You can get support in  many ways.  Tell your family, friends, and coworkers that you are going to quit and need their support. Ask them not to smoke around you.  Get individual, group, or telephone counseling and support. Programs are available at [employer] and health centers. Call your local health department for information about programs in your area.  Spiritual beliefs and practices may help some smokers quit.  Download a "quit meter" on your computer to keep track of quit statistics, such as how long you have gone without smoking, cigarettes not smoked, and money saved.  Get a self-help book about quitting smoking and staying  off tobacco. [place] yourself from urges to smoke. Talk to someone, go for a walk, or occupy your time with a task.  Change your normal routine. Take a different route to work. Drink tea instead of coffee. Eat breakfast in a different place.  Reduce your stress. Take a hot bath, exercise, or read a book.  Plan something enjoyable to do every day. Reward yourself for not smoking.  Explore interactive web-based programs that specialize in helping you quit. GET MEDICINE AND USE IT CORRECTLY Medicines can help you stop smoking and decrease the urge to smoke. Combining medicine with the above behavioral methods and support can greatly increase your chances of successfully quitting smoking.  Nicotine replacement therapy helps deliver nicotine to your body without the negative effects and risks of smoking. Nicotine replacement therapy includes nicotine gum, lozenges, inhalers, nasal sprays, and skin patches. Some may be available over-the-counter and others require a prescription.  Antidepressant medicine helps people abstain from smoking, but how this works is unknown. This medicine is available by prescription.  Nicotinic receptor partial agonist medicine simulates the effect of nicotine in your brain. This medicine is available by prescription. Ask your health care provider for advice about which medicines to use and how to use them based on your health history. Your health care provider will tell you what side effects to look out for if you choose to be on a medicine or therapy. Carefully read the information on the package. Do not use any other product containing nicotine while using a nicotine replacement product.  RELAPSE OR DIFFICULT SITUATIONS Most relapses occur within the first 3 months after quitting. Do not be discouraged if you start smoking again. Remember, most people try several times before finally quitting. You may have symptoms of withdrawal because  your body is used to nicotine. You may crave cigarettes, be irritable, feel very hungry, cough often, get headaches, or have difficulty concentrating. The withdrawal symptoms are only temporary. They are strongest when you first quit, but they will go away within 10-14 days. To reduce the chances of relapse, try to:  Avoid drinking alcohol. Drinking lowers your chances of successfully quitting.  Reduce the amount of caffeine you consume. Once you quit smoking, the amount of caffeine in your body increases and can give you symptoms, such as a rapid heartbeat, sweating, and anxiety.  Avoid smokers because they can make you want to smoke.  Do not let weight gain distract you. Many smokers will gain weight when they quit, usually less than 10 pounds. Eat a healthy diet and stay active. You can always lose the weight gained after you quit.  Find ways to improve your mood other than smoking. FOR MORE INFORMATION  www.smokefree.gov  Document Released: 02/22/2001 Document Revised: 07/15/2013 Document Reviewed: 06/09/2011 [facility] Patient Information 2015 [place], [place]. This information is not intended to  replace advice given to you by your health care provider. Make sure you discuss any questions you have with your health care provider.  

## 2014-03-26 NOTE — Progress Notes (Signed)
  Subjective:     Kelly Roth is a 64 y.o. female and is here for a comprehensive physical exam. The patient reports no problems.  History   Social History  . Marital Status: Married    Spouse Name: N/A    Number of Children: N/A  . Years of Education: N/A   Occupational History  . Not on file.   Social History Main Topics  . Smoking status: Current Every Day Smoker -- 1.00 packs/day  . Smokeless tobacco: Never Used  . Alcohol Use: 0.0 oz/week     Comment: one drink a week   . Drug Use: No  . Sexual Activity: Not on file   Other Topics Concern  . Not on file   Social History Narrative  . No narrative on file   Health Maintenance  Topic Date Due  . HIV Screening  03/02/1968  . ZOSTAVAX  03/02/2013  . INFLUENZA VACCINE  10/12/2013  . PAP SMEAR  09/12/2014  . MAMMOGRAM  03/20/2015  . TETANUS/TDAP  05/18/2016  . COLONOSCOPY  02/20/2018    The following portions of the patient's history were reviewed and updated as appropriate: allergies, current medications, past family history, past medical history, past social history, past surgical history and problem list.  Review of Systems A comprehensive review of systems was negative.   Objective:    There were no vitals taken for this visit. General appearance: alert, cooperative and moderately obese Head: Normocephalic, without obvious abnormality, atraumatic Eyes: conj clear, EOMI, PEERLA Ears: normal TM's and external ear canals both ears Nose: Nares normal. Septum midline. Mucosa normal. No drainage or sinus tenderness. Throat: lips, mucosa, and tongue normal; teeth and gums normal Neck: no adenopathy, no carotid bruit, no JVD, supple, symmetrical, trachea midline and thyroid not enlarged, symmetric, no tenderness/mass/nodules Back: symmetric, no curvature. ROM normal. No CVA tenderness. Lungs: clear to auscultation bilaterally Breasts: normal appearance, no masses or tenderness Heart: regular rate and  rhythm, S1, S2 normal, no murmur, click, rub or gallop Abdomen: soft, non-tender; bowel sounds normal; no masses,  no organomegaly Pelvic: deferred Extremities: extremities normal, atraumatic, no cyanosis or edema Pulses: 2+ and symmetric Skin: Skin color, texture, turgor normal. No rashes or lesions Lymph nodes: Cervical, supraclavicular, and axillary nodes normal. Neurologic: Grossly normal    Assessment:    Healthy female exam.      Plan:     See After Visit Summary for Counseling Recommendations  Keep up a regular exercise program and make sure you are eating a healthy diet Try to eat 4 servings of dairy a day, or if you are lactose intolerant take a calcium with vitamin D daily.  Your vaccines are up to date.  Given shingles vaccine.  Pneumonia vaccine is UTD.  Mammogram is UTD.    Tob abuse- She wants to try Chantix. Took it for a short term. Did have some vivid dreams.  She plans on visiting her mother who doesn't smoke.   Onychomycosis - Says the oral lamisil wasn't helping.    Severe obesity/BMI 45 -she plans on starting [employer] weight loss program when she goes to visit her mother. It's mostly a portion control type diet. She wanted to make sure that I thought it was safe. I think it would be fantastic to help her reach her goals.

## 2014-03-27 LAB — HIV ANTIBODY (ROUTINE TESTING W REFLEX): HIV 1&2 Ab, 4th Generation: NONREACTIVE

## 2014-03-31 ENCOUNTER — Encounter: Payer: Self-pay | Admitting: Family Medicine

## 2014-03-31 ENCOUNTER — Ambulatory Visit (INDEPENDENT_AMBULATORY_CARE_PROVIDER_SITE_OTHER): Payer: BLUE CROSS/BLUE SHIELD | Admitting: Family Medicine

## 2014-03-31 VITALS — BP 126/60 | HR 76 | Ht 64.0 in | Wt 265.0 lb

## 2014-03-31 DIAGNOSIS — R0602 Shortness of breath: Secondary | ICD-10-CM | POA: Diagnosis not present

## 2014-03-31 DIAGNOSIS — J42 Unspecified chronic bronchitis: Secondary | ICD-10-CM | POA: Diagnosis not present

## 2014-03-31 DIAGNOSIS — Z720 Tobacco use: Secondary | ICD-10-CM

## 2014-03-31 MED ORDER — TIOTROPIUM BROMIDE MONOHYDRATE 2.5 MCG/ACT IN AERS: 1.0000 | INHALATION_SPRAY | Freq: Every day | RESPIRATORY_TRACT | Status: DC

## 2014-03-31 MED ORDER — ALBUTEROL SULFATE (2.5 MG/3ML) 0.083% IN NEBU
2.5000 mg | INHALATION_SOLUTION | Freq: Once | RESPIRATORY_TRACT | Status: AC
  Administered 2014-03-31: 2.5 mg via RESPIRATORY_TRACT

## 2014-03-31 NOTE — Progress Notes (Signed)
   Subjective:    Patient ID: Kelly Roth, female    DOB: 1950/07/02, 64 y.o.   MRN: [id number]  HPI patient has come in several times in the last year for shortness of breath. She is a heavy smoker and currently smokes one pack per day.though she quit a few hours ago. She is on Chantix.  Tolerating it well without any significant side effects. She has never had spirometry performed before. Her mom has COPD.  Review of Systems     Objective:   Physical Exam  Constitutional: She is oriented to person, place, and time. She appears well-developed and well-nourished.  HENT:  Head: Normocephalic and atraumatic.  Cardiovascular: Normal rate, regular rhythm and normal heart sounds.   Pulmonary/Chest: Effort normal and breath sounds normal.  Neurological: She is alert and oriented to person, place, and time.  Skin: Skin is warm and dry.  Psychiatric: She has a normal mood and affect. Her behavior is normal.        Assessment & Plan:  Spirometry- new Dx.  FVC of 49%, FEV1 of 41% and ratio of 66%. She did have 12% improvement in FEV1 after albuterol treatment. She had to corrected on techniques several times.  We discussed the results today. The most important thing is that she quit smoking. She is Re: Started Chantix.   I tried to give her encouragement to be successful on to quit. We will also start her on Spiriva. I will see her back in 3 months. Hopefully if she is successful and using Spiriva regularly we can see how often she is actually needing her albuterol and that can better guide me as far as whether not we need to adjust her therapy.   tobacco abuse-encourage sensation. Doing well with the Chantix so far.

## 2014-04-13 ENCOUNTER — Other Ambulatory Visit: Payer: Self-pay | Admitting: Family Medicine

## 2014-04-14 MED ORDER — VARENICLINE TARTRATE 1 MG PO TABS
1.0000 mg | ORAL_TABLET | Freq: Two times a day (BID) | ORAL | Status: DC
Start: 2014-04-14 — End: 2014-06-24

## 2014-04-14 NOTE — Telephone Encounter (Signed)
Rx sent for continuing pack

## 2014-04-14 NOTE — Telephone Encounter (Signed)
chantix started pak was sent in less than a month ago. Ok to send in continuing pak?

## 2014-06-24 ENCOUNTER — Other Ambulatory Visit: Payer: Self-pay | Admitting: Family Medicine

## 2014-07-14 ENCOUNTER — Other Ambulatory Visit: Payer: Self-pay | Admitting: Family Medicine

## 2014-07-31 ENCOUNTER — Other Ambulatory Visit: Payer: Self-pay | Admitting: Family Medicine

## 2014-08-13 ENCOUNTER — Other Ambulatory Visit: Payer: Self-pay | Admitting: Family Medicine

## 2014-08-22 ENCOUNTER — Other Ambulatory Visit: Payer: Self-pay | Admitting: Family Medicine

## 2014-08-26 ENCOUNTER — Other Ambulatory Visit: Payer: Self-pay | Admitting: Family Medicine

## 2014-09-17 ENCOUNTER — Other Ambulatory Visit: Payer: Self-pay | Admitting: Family Medicine

## 2014-09-21 ENCOUNTER — Other Ambulatory Visit: Payer: Self-pay | Admitting: Family Medicine

## 2014-09-22 NOTE — Telephone Encounter (Signed)
Refill ok or does she need a f/u appt for the chantix?

## 2014-09-24 ENCOUNTER — Ambulatory Visit (INDEPENDENT_AMBULATORY_CARE_PROVIDER_SITE_OTHER): Payer: BLUE CROSS/BLUE SHIELD | Admitting: Family Medicine

## 2014-09-24 ENCOUNTER — Encounter: Payer: Self-pay | Admitting: Family Medicine

## 2014-09-24 VITALS — BP 110/66 | HR 116 | Ht 64.0 in | Wt 282.0 lb

## 2014-09-24 DIAGNOSIS — I10 Essential (primary) hypertension: Secondary | ICD-10-CM | POA: Diagnosis not present

## 2014-09-24 DIAGNOSIS — E785 Hyperlipidemia, unspecified: Secondary | ICD-10-CM

## 2014-09-24 DIAGNOSIS — E8881 Metabolic syndrome: Secondary | ICD-10-CM

## 2014-09-24 DIAGNOSIS — Z1159 Encounter for screening for other viral diseases: Secondary | ICD-10-CM

## 2014-09-24 DIAGNOSIS — Z720 Tobacco use: Secondary | ICD-10-CM

## 2014-09-24 LAB — POCT GLYCOSYLATED HEMOGLOBIN (HGB A1C): Hemoglobin A1C: 5.7

## 2014-09-24 MED ORDER — LORCASERIN HCL 10 MG PO TABS: 1.0000 | ORAL_TABLET | Freq: Two times a day (BID) | ORAL | Status: DC

## 2014-09-24 NOTE — Progress Notes (Signed)
   Subjective:    Patient ID: Kelly Roth, female    DOB: [birth year], 64 y.o.   MRN: [id number]  HPI Hypertension- Pt denies chest pain, SOB, dizziness, or heart palpitations.  Taking meds as directed w/o problems.  Denies medication side effects.    IFG - no increased thirst or urination. She has tried to lose some weight. She went on the Nutrisystem earlier in the year and was able to lose about 10 pounds but unfortunately got off track and started to gain the weight back again.   Tobacco abuse-she's currently on Chantix. Has been using chewing gum and using adult coloring books to help her reduce her stress. She still has smoked an occasional cigarette but otherwise is doing well.  Hyperlipidemia - toleraitng crestor well with no S.E.    Lab Results  Component Value Date   CHOL 151 09/21/2012   HDL 42 09/21/2012   LDLCALC 90 09/21/2012   TRIG 95 09/21/2012   CHOLHDL 3.6 09/21/2012   Chronic back pain - did see a new ortho on [place] and got an injection in her back and it gave her a lot of relief in her right hip.  She would like to go back and see if can get another one to help her left hip.   Review of Systems     Objective:   Physical Exam  Constitutional: She is oriented to person, place, and time. She appears well-developed and well-nourished.  HENT:  Head: Normocephalic and atraumatic.  Cardiovascular: Normal rate, regular rhythm and normal heart sounds.   Pulmonary/Chest: Effort normal and breath sounds normal.  Neurological: She is alert and oriented to person, place, and time.  Skin: Skin is warm and dry.  Psychiatric: She has a normal mood and affect. Her behavior is normal.          Assessment & Plan:  HTN - well controlled.  Continue current regimen. Follow-up in 6 months. For CMP and fasting lipid panel.  IFG - well controlled. F/U in 6 months. Continue to work on diet and exercise. A1c is down to 5.7 today which is actually fantastic.  Recommend  screening for hepatitis C.  Hyperlipidemia - due to recheck lipids.    BMI 48 - she is really unable to exercise with her chronic back and hip pain. Sore don't have to really focusing on diet. She did do the Nutrisystem earlier this year and was able to lose about 10 pounds the first month but then she said she basically just got off track. She feels like she really needs something to help her just curb her appetite. We discussed treatment options. With a BMI greater than 40 think we should at least try a weight loss medication. We'll start with Belviq  which is a non-stimulant. Coupon card provided. We discussed potential side effects of the medication. She will need to follow-up in one month. Coupon card provided. Encouraged her to really focus on her dietary choices and reviewed with her to increase vegetables work on eating lean meats. Avoiding carb type snacks.  Tobacco abuse-continue with Chantix. Refilled today.

## 2014-09-25 ENCOUNTER — Other Ambulatory Visit: Payer: Self-pay | Admitting: *Deleted

## 2014-09-25 LAB — LIPID PANEL
CHOL/HDL RATIO: 2.8 ratio
Cholesterol: 148 mg/dL (ref 0–200)
HDL: 53 mg/dL (ref >=46)
LDL Cholesterol: 71 mg/dL (ref 0–99)
Triglycerides: 121 mg/dL (ref <150)
VLDL: 24 mg/dL (ref 0–40)

## 2014-09-25 LAB — COMPLETE METABOLIC PANEL WITH GFR
ALBUMIN: 4 g/dL (ref 3.5–5.2)
ALT: 59 U/L — ABNORMAL HIGH (ref 0–35)
AST: 29 U/L (ref 0–37)
Alkaline Phosphatase: 81 U/L (ref 39–117)
BILIRUBIN TOTAL: 0.8 mg/dL (ref 0.2–1.2)
BUN: 17 mg/dL (ref 6–23)
CO2: 25 mEq/L (ref 19–32)
CREATININE: 0.71 mg/dL (ref 0.50–1.10)
Calcium: 9.7 mg/dL (ref 8.4–10.5)
Chloride: 100 mEq/L (ref 96–112)
GLUCOSE: 111 mg/dL — AB (ref 70–99)
POTASSIUM: 4.1 meq/L (ref 3.5–5.3)
SODIUM: 141 meq/L (ref 135–145)
Total Protein: 6.5 g/dL (ref 6.0–8.3)

## 2014-09-25 MED ORDER — LORCASERIN HCL 10 MG PO TABS: 1.0000 | ORAL_TABLET | Freq: Two times a day (BID) | ORAL | Status: DC

## 2014-09-26 LAB — HEPATITIS C ANTIBODY: HCV Ab: NEGATIVE

## 2014-10-01 ENCOUNTER — Telehealth: Payer: Self-pay | Admitting: Family Medicine

## 2014-10-01 NOTE — Telephone Encounter (Signed)
Received fax from pharmacy for prior authorization on Belviq 10 mg sent through cover my meds waiting on authorization. - CF

## 2014-10-02 NOTE — Telephone Encounter (Signed)
Received authorization from [place] regarding Belviq. It has been approved. Reference #: [id number]. Valid dates: 10/01/14-03/29/15.  Pharmacy notified of authorization.

## 2014-10-03 ENCOUNTER — Other Ambulatory Visit: Payer: Self-pay | Admitting: Family Medicine

## 2014-10-30 ENCOUNTER — Other Ambulatory Visit: Payer: Self-pay | Admitting: Family Medicine

## 2014-12-25 ENCOUNTER — Other Ambulatory Visit: Payer: Self-pay | Admitting: Family Medicine

## 2015-02-16 ENCOUNTER — Other Ambulatory Visit: Payer: Self-pay | Admitting: Family Medicine

## 2015-02-26 ENCOUNTER — Other Ambulatory Visit: Payer: Self-pay | Admitting: Family Medicine

## 2015-03-02 ENCOUNTER — Other Ambulatory Visit: Payer: Self-pay | Admitting: Specialist

## 2015-03-02 DIAGNOSIS — M545 Low back pain: Secondary | ICD-10-CM

## 2015-03-11 ENCOUNTER — Other Ambulatory Visit: Payer: Self-pay | Admitting: Family Medicine

## 2015-03-12 ENCOUNTER — Ambulatory Visit
Admission: RE | Admit: 2015-03-12 | Discharge: 2015-03-12 | Disposition: A | Discharge status: Home / Self Care | Payer: BLUE CROSS/BLUE SHIELD | Source: Ambulatory Visit | Attending: Specialist | Admitting: Specialist

## 2015-03-12 DIAGNOSIS — M545 Low back pain: Secondary | ICD-10-CM

## 2015-03-17 ENCOUNTER — Other Ambulatory Visit: Payer: Self-pay | Admitting: Family Medicine

## 2015-04-27 ENCOUNTER — Other Ambulatory Visit: Payer: Self-pay | Admitting: Family Medicine

## 2015-04-28 ENCOUNTER — Telehealth: Payer: Self-pay | Admitting: Family Medicine

## 2015-04-28 NOTE — Telephone Encounter (Signed)
I called pt and informed her that she is due for a follow up on her COPD and scheduled her for this week on 04-30-15

## 2015-04-30 ENCOUNTER — Encounter: Payer: Self-pay | Admitting: Family Medicine

## 2015-04-30 ENCOUNTER — Ambulatory Visit (INDEPENDENT_AMBULATORY_CARE_PROVIDER_SITE_OTHER): Payer: BLUE CROSS/BLUE SHIELD | Admitting: Family Medicine

## 2015-04-30 VITALS — BP 138/59 | HR 112 | Ht 64.0 in | Wt 281.0 lb

## 2015-04-30 DIAGNOSIS — R635 Abnormal weight gain: Secondary | ICD-10-CM

## 2015-04-30 DIAGNOSIS — I10 Essential (primary) hypertension: Secondary | ICD-10-CM | POA: Diagnosis not present

## 2015-04-30 DIAGNOSIS — Z23 Encounter for immunization: Secondary | ICD-10-CM

## 2015-04-30 DIAGNOSIS — E8881 Metabolic syndrome: Secondary | ICD-10-CM | POA: Diagnosis not present

## 2015-04-30 DIAGNOSIS — Z720 Tobacco use: Secondary | ICD-10-CM | POA: Diagnosis not present

## 2015-04-30 MED ORDER — LISINOPRIL-HYDROCHLOROTHIAZIDE 20-12.5 MG PO TABS
1.0000 | ORAL_TABLET | Freq: Every day | ORAL | Status: DC
Start: 2015-04-30 — End: 2015-09-28

## 2015-04-30 MED ORDER — PIROXICAM 10 MG PO CAPS: ORAL_CAPSULE | ORAL | Status: DC

## 2015-04-30 MED ORDER — METFORMIN HCL 500 MG PO TABS: 500.0000 mg | ORAL_TABLET | Freq: Two times a day (BID) | ORAL | Status: DC

## 2015-04-30 MED ORDER — ALBUTEROL SULFATE HFA 108 (90 BASE) MCG/ACT IN AERS: 2.0000 | INHALATION_SPRAY | Freq: Four times a day (QID) | RESPIRATORY_TRACT | Status: DC | PRN

## 2015-04-30 MED ORDER — VARENICLINE TARTRATE 1 MG PO TABS: 1.0000 mg | ORAL_TABLET | Freq: Two times a day (BID) | ORAL | Status: DC

## 2015-04-30 NOTE — Progress Notes (Signed)
   Subjective:    Patient ID: Kelly Roth, female    DOB: 09/11/1950, 65 y.o.   MRN: [id number]  HPI Hypertension- Pt denies chest pain, SOB, dizziness, or heart palpitations.  Taking meds as directed w/o problems.  Denies medication side effects.    tob abuse - Has cut back.  Says when she visits her mom for a month she doesn't smoke but when at home she smokes bc her husband smokes.    IFG - Not exercising but has tried to be more active.    She is considering back surgery for a ruptured disc. Seeing Dr. Louanne Skye.     Review of Systems     Objective:   Physical Exam  Constitutional: She is oriented to person, place, and time. She appears well-developed and well-nourished.  HENT:  Head: Normocephalic and atraumatic.  Cardiovascular: Normal rate, regular rhythm and normal heart sounds.   Pulmonary/Chest: Effort normal and breath sounds normal.  Neurological: She is alert and oriented to person, place, and time.  Skin: Skin is warm and dry.  Psychiatric: She has a normal mood and affect. Her behavior is normal.          Assessment & Plan:  HTN - well controlled. F/U in 6 months. Due for labs.    Tob abuse - she has cut back. Not quit completely but says the chantix did help her.    IFG - will check A1C today and then f/u in 6 month. Work on diet and exercise.    Abnormal weight gain/BMI 48 - will start metformin.

## 2015-05-01 LAB — BASIC METABOLIC PANEL
BUN: 17 mg/dL (ref 7–25)
CALCIUM: 10 mg/dL (ref 8.6–10.4)
CO2: 24 mmol/L (ref 20–31)
CREATININE: 0.61 mg/dL (ref 0.50–0.99)
Chloride: 106 mmol/L (ref 98–110)
GLUCOSE: 107 mg/dL — AB (ref 65–99)
Potassium: 4 mmol/L (ref 3.5–5.3)
Sodium: 138 mmol/L (ref 135–146)

## 2015-05-01 LAB — HEMOGLOBIN A1C
Hgb A1c MFr Bld: 6.4 % — ABNORMAL HIGH (ref <5.7)
MEAN PLASMA GLUCOSE: 137 mg/dL — AB (ref <117)

## 2015-05-19 ENCOUNTER — Other Ambulatory Visit: Payer: Self-pay | Admitting: Family Medicine

## 2015-05-21 ENCOUNTER — Other Ambulatory Visit: Payer: Self-pay | Admitting: Sports Medicine

## 2015-05-21 NOTE — Telephone Encounter (Signed)
Is the furosemide ok to fill? It hasn't been filled since 2015 and it wasn't actually ever prescribed by you. She was just here 04-30-15 and kidney labs were ok. I just wasn't sure

## 2015-05-21 NOTE — Telephone Encounter (Signed)
Ok to fill 

## 2015-05-26 LAB — HM MAMMOGRAPHY

## 2015-06-05 ENCOUNTER — Encounter: Payer: Self-pay | Admitting: Family Medicine

## 2015-06-15 ENCOUNTER — Other Ambulatory Visit: Payer: Self-pay | Admitting: Family Medicine

## 2015-07-20 DIAGNOSIS — M5136 Other intervertebral disc degeneration, lumbar region: Secondary | ICD-10-CM | POA: Diagnosis not present

## 2015-07-20 DIAGNOSIS — G8929 Other chronic pain: Secondary | ICD-10-CM | POA: Diagnosis not present

## 2015-07-20 DIAGNOSIS — M5116 Intervertebral disc disorders with radiculopathy, lumbar region: Secondary | ICD-10-CM | POA: Diagnosis not present

## 2015-07-20 DIAGNOSIS — M47816 Spondylosis without myelopathy or radiculopathy, lumbar region: Secondary | ICD-10-CM | POA: Diagnosis not present

## 2015-07-20 DIAGNOSIS — M4806 Spinal stenosis, lumbar region: Secondary | ICD-10-CM | POA: Diagnosis not present

## 2015-07-20 DIAGNOSIS — Z79891 Long term (current) use of opiate analgesic: Secondary | ICD-10-CM | POA: Diagnosis not present

## 2015-08-19 DIAGNOSIS — M4806 Spinal stenosis, lumbar region: Secondary | ICD-10-CM | POA: Diagnosis not present

## 2015-09-21 DIAGNOSIS — M4806 Spinal stenosis, lumbar region: Secondary | ICD-10-CM | POA: Diagnosis not present

## 2015-09-21 DIAGNOSIS — M5416 Radiculopathy, lumbar region: Secondary | ICD-10-CM | POA: Diagnosis not present

## 2015-09-22 DIAGNOSIS — G8929 Other chronic pain: Secondary | ICD-10-CM | POA: Diagnosis not present

## 2015-09-22 DIAGNOSIS — M47816 Spondylosis without myelopathy or radiculopathy, lumbar region: Secondary | ICD-10-CM | POA: Diagnosis not present

## 2015-09-22 DIAGNOSIS — M5136 Other intervertebral disc degeneration, lumbar region: Secondary | ICD-10-CM | POA: Diagnosis not present

## 2015-09-22 DIAGNOSIS — Z79891 Long term (current) use of opiate analgesic: Secondary | ICD-10-CM | POA: Diagnosis not present

## 2015-09-28 ENCOUNTER — Ambulatory Visit (INDEPENDENT_AMBULATORY_CARE_PROVIDER_SITE_OTHER): Payer: Medicare HMO | Admitting: Family Medicine

## 2015-09-28 ENCOUNTER — Encounter: Payer: Self-pay | Admitting: Family Medicine

## 2015-09-28 VITALS — BP 121/61 | HR 113 | Wt 263.0 lb

## 2015-09-28 DIAGNOSIS — E785 Hyperlipidemia, unspecified: Secondary | ICD-10-CM

## 2015-09-28 DIAGNOSIS — E8881 Metabolic syndrome: Secondary | ICD-10-CM

## 2015-09-28 DIAGNOSIS — Z6841 Body Mass Index (BMI) 40.0 and over, adult: Secondary | ICD-10-CM

## 2015-09-28 DIAGNOSIS — I10 Essential (primary) hypertension: Secondary | ICD-10-CM

## 2015-09-28 LAB — TSH: TSH: 2.04 m[IU]/L

## 2015-09-28 LAB — POCT GLYCOSYLATED HEMOGLOBIN (HGB A1C): Hemoglobin A1C: 5.7

## 2015-09-28 MED ORDER — ROSUVASTATIN CALCIUM 40 MG PO TABS: 40.0000 mg | ORAL_TABLET | Freq: Every day | ORAL | Status: DC

## 2015-09-28 MED ORDER — LISINOPRIL-HYDROCHLOROTHIAZIDE 20-12.5 MG PO TABS: 1.0000 | ORAL_TABLET | Freq: Every day | ORAL | Status: DC

## 2015-09-28 MED ORDER — PIROXICAM 10 MG PO CAPS: 10.0000 mg | ORAL_CAPSULE | Freq: Two times a day (BID) | ORAL | Status: DC | PRN

## 2015-09-28 NOTE — Progress Notes (Signed)
Subjective:    CC:   HPI: Hypertension- Pt denies chest pain, SOB, dizziness, or heart palpitations.  Taking meds as directed w/o problems.  Denies medication side effects.    BMI 45 She is doing really well. She is on a new diet plan which she eats small meals a day, approximately 5. She has lost 18 pounds total. She's also completely quit drinking diet soda and just drinks water and one or 2 cups of coffee in the morning.  Would like to have her thyroid checked. She has lost some weight and has strong family hx of thyroid problems.   IFG - no inc thrist or urination.    She has quit smoking x 1 year now!!!!   Past medical history, Surgical history, Family history not pertinant except as noted below, Social history, Allergies, and medications have been entered into the medical record, reviewed, and corrections made.   Review of Systems: No fevers, chills, night sweats, weight loss, chest pain, or shortness of breath.   Objective:    General: Well Developed, well nourished, and in no acute distress.  Neuro: Alert and oriented x3, extra-ocular muscles intact, sensation grossly intact.  HEENT: Normocephalic, atraumatic  Skin: Warm and dry, no rashes. Cardiac: Regular rate and rhythm, no murmurs rubs or gallops, no lower extremity edema.  Respiratory: Clear to auscultation bilaterally. Not using accessory muscles, speaking in full sentences.   Impression and Recommendations:   HTN - Well controlled. Continue current regimen. Follow up in 6 months   IFG - well controlled. A1C is down to 5.7.   F/U in 6 months.   Obesity/BMI 45-she's been absolutely fantastic and has lost 18 pounds. Her plan is to continue to work on weight loss. Her A1c is down which is fantastic.

## 2015-09-29 LAB — COMPLETE METABOLIC PANEL WITH GFR
ALBUMIN: 4.1 g/dL (ref 3.6–5.1)
ALK PHOS: 87 U/L (ref 33–130)
ALT: 55 U/L — ABNORMAL HIGH (ref 6–29)
AST: 21 U/L (ref 10–35)
BILIRUBIN TOTAL: 0.8 mg/dL (ref 0.2–1.2)
BUN: 16 mg/dL (ref 7–25)
CO2: 23 mmol/L (ref 20–31)
Calcium: 10 mg/dL (ref 8.6–10.4)
Chloride: 104 mmol/L (ref 98–110)
Creat: 0.67 mg/dL (ref 0.50–0.99)
Glucose, Bld: 107 mg/dL — ABNORMAL HIGH (ref 65–99)
Potassium: 4.2 mmol/L (ref 3.5–5.3)
Sodium: 140 mmol/L (ref 135–146)
TOTAL PROTEIN: 6.6 g/dL (ref 6.1–8.1)

## 2015-09-29 LAB — LIPID PANEL
Cholesterol: 278 mg/dL — ABNORMAL HIGH (ref 125–200)
HDL: 53 mg/dL (ref >=46)
LDL Cholesterol: 194 mg/dL — ABNORMAL HIGH (ref <130)
TRIGLYCERIDES: 153 mg/dL — AB (ref <150)
Total CHOL/HDL Ratio: 5.2 Ratio — ABNORMAL HIGH (ref <=5.0)
VLDL: 31 mg/dL — ABNORMAL HIGH (ref <30)

## 2015-10-07 DIAGNOSIS — M5116 Intervertebral disc disorders with radiculopathy, lumbar region: Secondary | ICD-10-CM | POA: Diagnosis not present

## 2015-10-07 DIAGNOSIS — M5416 Radiculopathy, lumbar region: Secondary | ICD-10-CM | POA: Diagnosis not present

## 2015-10-07 DIAGNOSIS — M4806 Spinal stenosis, lumbar region: Secondary | ICD-10-CM | POA: Diagnosis not present

## 2015-10-29 ENCOUNTER — Ambulatory Visit: Payer: BLUE CROSS/BLUE SHIELD | Admitting: Family Medicine

## 2015-12-07 DIAGNOSIS — Z79891 Long term (current) use of opiate analgesic: Secondary | ICD-10-CM | POA: Diagnosis not present

## 2015-12-07 DIAGNOSIS — G8929 Other chronic pain: Secondary | ICD-10-CM | POA: Diagnosis not present

## 2015-12-07 DIAGNOSIS — M5136 Other intervertebral disc degeneration, lumbar region: Secondary | ICD-10-CM | POA: Diagnosis not present

## 2015-12-07 DIAGNOSIS — M47816 Spondylosis without myelopathy or radiculopathy, lumbar region: Secondary | ICD-10-CM | POA: Diagnosis not present

## 2015-12-09 DIAGNOSIS — M4806 Spinal stenosis, lumbar region: Secondary | ICD-10-CM | POA: Diagnosis not present

## 2015-12-28 ENCOUNTER — Other Ambulatory Visit: Payer: Self-pay | Admitting: Family Medicine

## 2016-02-25 ENCOUNTER — Other Ambulatory Visit: Payer: Self-pay

## 2016-02-25 ENCOUNTER — Encounter (HOSPITAL_COMMUNITY)
Admission: RE | Admit: 2016-02-25 | Discharge: 2016-02-25 | Disposition: A | Discharge status: Home / Self Care | Payer: Medicare HMO | Source: Ambulatory Visit | Attending: Specialist | Admitting: Specialist

## 2016-02-25 ENCOUNTER — Encounter (HOSPITAL_COMMUNITY): Payer: Self-pay

## 2016-02-25 DIAGNOSIS — M48061 Spinal stenosis, lumbar region without neurogenic claudication: Secondary | ICD-10-CM | POA: Insufficient documentation

## 2016-02-25 DIAGNOSIS — Z6841 Body Mass Index (BMI) 40.0 and over, adult: Secondary | ICD-10-CM | POA: Insufficient documentation

## 2016-02-25 DIAGNOSIS — J449 Chronic obstructive pulmonary disease, unspecified: Secondary | ICD-10-CM | POA: Insufficient documentation

## 2016-02-25 DIAGNOSIS — Z01812 Encounter for preprocedural laboratory examination: Secondary | ICD-10-CM | POA: Diagnosis not present

## 2016-02-25 DIAGNOSIS — Z87891 Personal history of nicotine dependence: Secondary | ICD-10-CM | POA: Insufficient documentation

## 2016-02-25 DIAGNOSIS — K219 Gastro-esophageal reflux disease without esophagitis: Secondary | ICD-10-CM | POA: Insufficient documentation

## 2016-02-25 DIAGNOSIS — Z01810 Encounter for preprocedural cardiovascular examination: Secondary | ICD-10-CM | POA: Diagnosis not present

## 2016-02-25 DIAGNOSIS — Z79899 Other long term (current) drug therapy: Secondary | ICD-10-CM | POA: Diagnosis not present

## 2016-02-25 DIAGNOSIS — E785 Hyperlipidemia, unspecified: Secondary | ICD-10-CM | POA: Diagnosis not present

## 2016-02-25 DIAGNOSIS — M5136 Other intervertebral disc degeneration, lumbar region: Secondary | ICD-10-CM | POA: Diagnosis not present

## 2016-02-25 DIAGNOSIS — Z79891 Long term (current) use of opiate analgesic: Secondary | ICD-10-CM | POA: Diagnosis not present

## 2016-02-25 DIAGNOSIS — G8929 Other chronic pain: Secondary | ICD-10-CM | POA: Diagnosis not present

## 2016-02-25 DIAGNOSIS — I10 Essential (primary) hypertension: Secondary | ICD-10-CM | POA: Insufficient documentation

## 2016-02-25 DIAGNOSIS — M47816 Spondylosis without myelopathy or radiculopathy, lumbar region: Secondary | ICD-10-CM | POA: Diagnosis not present

## 2016-02-25 HISTORY — DX: Nausea with vomiting, unspecified: R11.2

## 2016-02-25 HISTORY — DX: Adverse effect of unspecified anesthetic, initial encounter: T41.45XA

## 2016-02-25 HISTORY — DX: Other complications of anesthesia, initial encounter: T88.59XA

## 2016-02-25 HISTORY — DX: Chronic obstructive pulmonary disease, unspecified: J44.9

## 2016-02-25 HISTORY — DX: Dyspnea, unspecified: R06.00

## 2016-02-25 HISTORY — DX: Other specified postprocedural states: Z98.890

## 2016-02-25 LAB — URINALYSIS, ROUTINE W REFLEX MICROSCOPIC
Bilirubin Urine: NEGATIVE
GLUCOSE, UA: NEGATIVE mg/dL
HGB URINE DIPSTICK: NEGATIVE
Ketones, ur: NEGATIVE mg/dL
LEUKOCYTES UA: NEGATIVE
Nitrite: NEGATIVE
PROTEIN: NEGATIVE mg/dL
SPECIFIC GRAVITY, URINE: 1.013 (ref 1.005–1.030)
pH: 6 (ref 5.0–8.0)

## 2016-02-25 LAB — COMPREHENSIVE METABOLIC PANEL
ALBUMIN: 4.3 g/dL (ref 3.5–5.0)
ALK PHOS: 76 U/L (ref 38–126)
ALT: 30 U/L (ref 14–54)
AST: 20 U/L (ref 15–41)
Anion gap: 9 (ref 5–15)
BUN: 14 mg/dL (ref 6–20)
CALCIUM: 10.1 mg/dL (ref 8.9–10.3)
CHLORIDE: 107 mmol/L (ref 101–111)
CO2: 26 mmol/L (ref 22–32)
CREATININE: 0.69 mg/dL (ref 0.44–1.00)
GFR calc Af Amer: >60 mL/min (ref >60)
GFR calc non Af Amer: >60 mL/min (ref >60)
GLUCOSE: 115 mg/dL — AB (ref 65–99)
Potassium: 3.9 mmol/L (ref 3.5–5.1)
SODIUM: 142 mmol/L (ref 135–145)
Total Bilirubin: 1.1 mg/dL (ref 0.3–1.2)
Total Protein: 6.6 g/dL (ref 6.5–8.1)

## 2016-02-25 LAB — APTT: APTT: 29 s (ref 24–36)

## 2016-02-25 LAB — CBC
HCT: 44.5 % (ref 36.0–46.0)
HEMOGLOBIN: 15.5 g/dL — AB (ref 12.0–15.0)
MCH: 32 pg (ref 26.0–34.0)
MCHC: 34.8 g/dL (ref 30.0–36.0)
MCV: 91.8 fL (ref 78.0–100.0)
PLATELETS: 189 10*3/uL (ref 150–400)
RBC: 4.85 MIL/uL (ref 3.87–5.11)
RDW: 12.5 % (ref 11.5–15.5)
WBC: 10.4 10*3/uL (ref 4.0–10.5)

## 2016-02-25 LAB — SURGICAL PCR SCREEN
MRSA, PCR: NEGATIVE
Staphylococcus aureus: NEGATIVE

## 2016-02-25 LAB — PROTIME-INR
INR: 0.92
Prothrombin Time: 12.4 seconds (ref 11.4–15.2)

## 2016-02-25 NOTE — Pre-Procedure Instructions (Addendum)
Kelly Roth  02/25/2016      [id number] - [place], [address] [place] [address] Phone: [phone] Fax: [phone]  [facility] [place] - [address] [place] [address] Phone: [phone] Fax: [phone]    Your procedure is scheduled on:03/01/2016- TUESDAY  Report to [facility] Admitting at 5:30A.M.  Call this number if you have problems the morning of surgery:  [phone]   STOP Fish OIL, Anti-inflammatory, (such as FELDENE) - take last dose 12/14   Remember:  Do not eat food or drink liquids after midnight.   On Monday night    Take these medicines the morning of surgery with A SIP OF WATER: use Spirivia,  As needed, use albuterol, Hydrocodone, Cyclobenzaprine.     Do not wear jewelry, make-up or nail polish.   Do not wear lotions, powders, or perfumes, or deoderant.   Do not shave 48 hours prior to surgery   Do not bring valuables to the hospital.   [facility] is not responsible for any belongings or valuables.  Contacts, dentures or bridgework may not be worn into surgery.  Leave your suitcase in the car.  After surgery it may be brought to your room.  For patients admitted to the hospital, discharge time will be determined by your treatment team.  Patients discharged the day of surgery will not be allowed to drive home.   Name and phone number of your driver:   With spouse    Special instructions:  Special Instructions: [place] - Preparing for Surgery  Before surgery, you can play an important role.  Because skin is not sterile, your skin needs to be as free of germs as possible.  You can reduce the number of germs on you skin by washing with CHG (chlorahexidine gluconate) soap before surgery.  CHG is an antiseptic cleaner which kills germs and bonds with the skin to continue killing germs  even after washing.  Please DO NOT use if you have an allergy to CHG or antibacterial soaps.  If your skin becomes reddened/irritated stop using the CHG and inform your nurse when you arrive at Short Stay.  Do not shave (including legs and underarms) for at least 48 hours prior to the first CHG shower.  You may shave your face.  Please follow these instructions carefully:   1.  Shower with CHG Soap the night before surgery and the  morning of Surgery.  2.  If you choose to wash your hair, wash your hair first as usual with your  normal shampoo.  3.  After you shampoo, rinse your hair and body thoroughly to remove the  Shampoo.  4.  Use CHG as you would any other liquid soap.  You can apply chg directly to the skin and wash gently with scrungie or a clean washcloth.  5.  Apply the CHG Soap to your body ONLY FROM THE NECK DOWN.    Do not use on open wounds or open sores.  Avoid contact with your eyes, ears, mouth and genitals (private parts).  Wash genitals (private parts)   with your normal soap.  6.  Wash thoroughly, paying special attention to the area where your surgery will be performed.  7.  Thoroughly rinse your body with warm water from the neck down.  8.  DO NOT shower/wash  with your normal soap after using and rinsing off   the CHG Soap.  9.  Pat yourself dry with a clean towel.            10.  Wear clean pajamas.            11.  Place clean sheets on your bed the night of your first shower and do not sleep with pets.  Day of Surgery  Do not apply any lotions/deodorants the morning of surgery.  Please wear clean clothes to the hospital/surgery center.  Please read over the following fact sheets that you were given. Pain Booklet, Coughing and Deep Breathing, MRSA Information, Surgical Site Infection Prevention and Anesthesia Post-op Instructions

## 2016-02-26 NOTE — Progress Notes (Signed)
Anesthesia Chart Review: Patient is a 65 year old female scheduled for left and central L3-4 and left L4-5 decompressive lumbar laminectomy for spinal stenosis on 03/01/16 by Dr. Louanne Skye.  History including former smoker (quit 03/15/15), post-operative N/V (scopolamine patch successful in the patch), HLD, GERD, HTN, COPD, dyspnea, cholecystectomy '09, left TKA '07, teeth extractions. BMI is consistent with morbid obesity.   Meds include albuterol, Flexeril, fish oil, Lasix, Norco, lisinopril-HCTZ, Feldene, Crestor, Spiriva.   PCP is Dr. Beatrice Lecher who signed a note of medical clearance.   BP (!) 143/85   Pulse (!) 103   Temp 36.8 C   Resp 20   Ht 5\' 4"  (1.626 m)   Wt 254 lb 9.6 oz (115.5 kg)   SpO2 98%   BMI 43.70 kg/m   02/25/16 EKG: SR with PACs, low voltages QRS, non-specific ST/T wave abnormality.  03/31/14 Spirometry: FVC 1.50 (pre 49%, post 56%), FEV1 0.99 (pre 41%, post 46%), FEF 25-75% 0.49 (pre 21%, post 24%). Interpretation: Severe airway obstruction with low vital capacity. Post-bronchodilator test improved.  Preoperative labs noted. A1c 5.7 on 09/28/15.    Patient does have severe airway obstruction on 2016 spirometry, improvement on bronchodilator. It does not appear that she is on home O2, and she has now quit smoking. She has medical clearance. If no acute changes then I would anticipate that she can proceed as planned.  George Hugh [facility] Short Stay Center/Anesthesiology Phone [phone] 02/26/2016 5:31 PM

## 2016-02-29 NOTE — H&P (Signed)
Kelly Roth is an 65 y.o. female.   Chief Complaint: low back pain and bilat lower ext radiculopathy  HPI: patient with hx of L3-L5 stenosis and above complaints presented to our office.  Progressively worsening symptoms.  Failed conservative treatment.   Past Medical History:  Diagnosis Date  . Adenomatous polyps 2004    Colonoscopy done in [facility]  . Arthritis   . Back pain   . Complication of anesthesia   . COPD (chronic obstructive pulmonary disease) ([place])   . Dyspnea    pain contributes but also pt. reveals that she has been out of her Spiriva for one month +  . GERD (gastroesophageal reflux disease)   . Hemorrhoids   . Hyperlipidemia   . Hypertension   . Osteoarthritis   . PONV (postoperative nausea and vomiting)    scopolamine patch used with success with a prev. surg.     Past Surgical History:  Procedure Laterality Date  . CHOLECYSTECTOMY  05-08-07  . JOINT REPLACEMENT Bilateral   . left knee replacement  3-07   right on 2-06  . MULTIPLE TOOTH EXTRACTIONS      Family History  Problem Relation Age of Onset  . Depression Mother   . Hypertension Mother   . Hyperlipidemia Mother   . Cancer Mother     lung  . COPD Mother   . Hyperlipidemia Father   . Hypertension Father   . Colon cancer Neg Hx     Neg    Social History:  reports that she quit smoking about a year ago. She smoked 1.00 pack per day. She has never used smokeless tobacco. She reports that she drinks alcohol. She reports that she does not use drugs.  Allergies: No Known Allergies  No prescriptions prior to admission.    No results found for this or any previous visit (from the past 48 hour(s)). No results found.  Review of Systems  Constitutional: Negative.   HENT: Negative.   Eyes: Negative.   Respiratory: Negative.   Cardiovascular: Negative.   Gastrointestinal: Negative.   Genitourinary: Negative.   Musculoskeletal: Positive for back pain.  Skin: Negative.    Neurological: Positive for tingling.  Psychiatric/Behavioral: Negative.     There were no vitals taken for this visit. Physical Exam  Constitutional: She is oriented to person, place, and time. She appears well-developed. No distress.  HENT:  Head: Normocephalic and atraumatic.  Eyes: EOM are normal. Pupils are equal, round, and reactive to light.  Neck: Normal range of motion.  Respiratory: No respiratory distress.  GI: She exhibits no distension.  Musculoskeletal: She exhibits tenderness.  Neurological: She is alert and oriented to person, place, and time.  Skin: Skin is warm and dry.  Psychiatric: She has a normal mood and affect.    Study Result   CLINICAL DATA:  65 year old female with 2.5 years of lumbar back pain radiating to both hips and anterior thighs. No specific injury. Subsequent encounter.  EXAM: MRI LUMBAR SPINE WITHOUT CONTRAST  TECHNIQUE: Multiplanar, multisequence MR imaging of the lumbar spine was performed. No intravenous contrast was administered.  COMPARISON:  03/29/2013.  FINDINGS: Same numbering system as on the comparison designating normal lumbar segmentation. Large body habitus. Increased degenerative endplate marrow edema at L4-L5 eccentric to the right. Mild posterior inferior degenerative endplate marrow edema now at L1. Stable vertebral height and alignment.  No signal abnormality in the visualized lower thoracic spinal cord. Conus medullaris terminates at L2. Visualized abdominal viscera and paraspinal  soft tissues are within normal limits.  T11-T12: Chronic small central caudal disc protrusion is stable. No definite spinal stenosis.  T12-L1: Chronic disc space loss and left eccentric circumferential disc osteophyte complex. Left paracentral broad-based component of disc has mildly increased (series 6, image 4). Mild facet hypertrophy greater on the left is stable. Mild spinal stenosis is mildly increased. Minimal left hemi  cord mass effect, no cord signal abnormality.  L1-L2: Chronic disc space loss and right eccentric circumferential disc osteophyte complex. Moderate facet hypertrophy. Trace facet joint fluid. Mild epidural lipomatosis. Stable mild spinal stenosis and right L1 foraminal stenosis.  L2-L3: Chronic disc desiccation and circumferential disc bulge. Chronic moderate facet hypertrophy with trace facet joint fluid. Stable mild spinal stenosis.  L3-L4: Interval regressed or resected caudal disc extrusion extending from the midline into the left lateral recess posterior to the L4 vertebral body. Underlying chronic disc desiccation and left eccentric circumferential disc bulge. Moderate to severe facet hypertrophy greater on the left. Chronic facet joint fluid. Progression of left ligament flavum hypertrophy (series 6, image 21). But still left lateral recess patency has improved. Borderline to mild spinal stenosis. Mild to moderate left L3 foraminal stenosis is stable.  L4-L5: Chronic disc desiccation with disc space loss and circumferential disc osteophyte complex. Progressed right far lateral disc and endplate protrusion (series 6, image 26). Chronic moderate facet hypertrophy has mildly progressed, including new facet joint fluid at this level. Epidural lipomatosis. Mild to moderate right lateral recess stenosis is new. Mild spinal stenosis has increased. Moderate right L4 foraminal stenosis has not significantly changed. Stable mild left L4 foraminal stenosis.  L5-S1: Chronic disc desiccation and disc space loss with circumferential disc osteophyte complex. Stable mild to moderate facet hypertrophy. No spinal or lateral recess stenosis. Mild to moderate left and moderate to severe right L5 foraminal stenosis is stable.  IMPRESSION: 1. Diffuse lower thoracic and lumbar fairly advanced disc and endplate degeneration. Visualized spinal levels are stable since 2015 with the  following exceptions: 2. Regressed or resected L3-L4 caudal disc extrusion with improved left lateral recess patency. Stable mild spinal and mild to moderate left foraminal stenosis. 3. Increased right far lateral disc and endplate degeneration at L4-L5 with new degenerative marrow edema. New mild to moderate right lateral recess stenosis, increased mild spinal stenosis, but stable moderate right L4 foraminal stenosis. 4. Mildly increased left eccentric disc disease at T12-L1 with increased mild spinal stenosis.   Electronically Signed   By: Genevie Ann M.D.   On: 03/12/2015 08:43    Assessment/Plan L3-4 and L4-5 stenosis   Will proceed with LEFT AND CENTRAL L3-4 AND LEFT L4-5 DECOMPRESSIVE LUMBAR LAMINECTOMY FOR SPINAL STENOSIS.  Surgical procedure along with possible risks and complications discussed.  All questions answered and wishes to proceed.     Benjiman Core, PA-C 02/29/2016, 9:22 PM  Patient examined and lab reviewed with Ricard Dillon, PA-C.

## 2016-03-01 ENCOUNTER — Encounter (HOSPITAL_COMMUNITY): Payer: Self-pay | Admitting: Anesthesiology

## 2016-03-01 ENCOUNTER — Ambulatory Visit (HOSPITAL_COMMUNITY): Payer: Medicare HMO | Admitting: Vascular Surgery

## 2016-03-01 ENCOUNTER — Encounter (HOSPITAL_COMMUNITY)
Admission: RE | Disposition: A | Discharge status: Home / Self Care | Payer: Self-pay | Source: Ambulatory Visit | Attending: Specialist

## 2016-03-01 ENCOUNTER — Observation Stay (HOSPITAL_COMMUNITY)
Admission: RE | Admit: 2016-03-01 | Discharge: 2016-03-02 | Disposition: A | Discharge status: Home / Self Care | Payer: Medicare HMO | Source: Ambulatory Visit | Attending: Specialist | Admitting: Specialist

## 2016-03-01 ENCOUNTER — Ambulatory Visit (HOSPITAL_COMMUNITY): Payer: Medicare HMO

## 2016-03-01 ENCOUNTER — Ambulatory Visit (HOSPITAL_COMMUNITY): Payer: Medicare HMO | Admitting: Certified Registered Nurse Anesthetist

## 2016-03-01 DIAGNOSIS — Z801 Family history of malignant neoplasm of trachea, bronchus and lung: Secondary | ICD-10-CM | POA: Diagnosis not present

## 2016-03-01 DIAGNOSIS — M1990 Unspecified osteoarthritis, unspecified site: Secondary | ICD-10-CM | POA: Diagnosis not present

## 2016-03-01 DIAGNOSIS — E785 Hyperlipidemia, unspecified: Secondary | ICD-10-CM | POA: Insufficient documentation

## 2016-03-01 DIAGNOSIS — Z8249 Family history of ischemic heart disease and other diseases of the circulatory system: Secondary | ICD-10-CM | POA: Diagnosis not present

## 2016-03-01 DIAGNOSIS — Z818 Family history of other mental and behavioral disorders: Secondary | ICD-10-CM | POA: Diagnosis not present

## 2016-03-01 DIAGNOSIS — Z9889 Other specified postprocedural states: Secondary | ICD-10-CM | POA: Diagnosis not present

## 2016-03-01 DIAGNOSIS — Z96653 Presence of artificial knee joint, bilateral: Secondary | ICD-10-CM | POA: Insufficient documentation

## 2016-03-01 DIAGNOSIS — M5116 Intervertebral disc disorders with radiculopathy, lumbar region: Secondary | ICD-10-CM | POA: Diagnosis not present

## 2016-03-01 DIAGNOSIS — Z8349 Family history of other endocrine, nutritional and metabolic diseases: Secondary | ICD-10-CM | POA: Insufficient documentation

## 2016-03-01 DIAGNOSIS — I700 Atherosclerosis of aorta: Secondary | ICD-10-CM | POA: Diagnosis not present

## 2016-03-01 DIAGNOSIS — M48061 Spinal stenosis, lumbar region without neurogenic claudication: Secondary | ICD-10-CM

## 2016-03-01 DIAGNOSIS — I10 Essential (primary) hypertension: Secondary | ICD-10-CM | POA: Diagnosis not present

## 2016-03-01 DIAGNOSIS — Z86010 Personal history of colonic polyps: Secondary | ICD-10-CM | POA: Diagnosis not present

## 2016-03-01 DIAGNOSIS — M48062 Spinal stenosis, lumbar region with neurogenic claudication: Secondary | ICD-10-CM | POA: Diagnosis not present

## 2016-03-01 DIAGNOSIS — Z87891 Personal history of nicotine dependence: Secondary | ICD-10-CM | POA: Insufficient documentation

## 2016-03-01 DIAGNOSIS — K219 Gastro-esophageal reflux disease without esophagitis: Secondary | ICD-10-CM | POA: Insufficient documentation

## 2016-03-01 DIAGNOSIS — Z981 Arthrodesis status: Secondary | ICD-10-CM | POA: Diagnosis not present

## 2016-03-01 DIAGNOSIS — J449 Chronic obstructive pulmonary disease, unspecified: Secondary | ICD-10-CM | POA: Insufficient documentation

## 2016-03-01 DIAGNOSIS — Z9049 Acquired absence of other specified parts of digestive tract: Secondary | ICD-10-CM | POA: Diagnosis not present

## 2016-03-01 DIAGNOSIS — R0683 Snoring: Secondary | ICD-10-CM | POA: Diagnosis not present

## 2016-03-01 HISTORY — PX: LUMBAR LAMINECTOMY/DECOMPRESSION MICRODISCECTOMY: SHX5026

## 2016-03-01 SURGERY — LUMBAR LAMINECTOMY/DECOMPRESSION MICRODISCECTOMY
Anesthesia: General | Site: Back

## 2016-03-01 MED ORDER — BUPIVACAINE HCL 0.5 % IJ SOLN
INTRAMUSCULAR | Status: DC | PRN
  Administered 2016-03-01: 30 mL

## 2016-03-01 MED ORDER — FLEET ENEMA 7-19 GM/118ML RE ENEM: 1.0000 | ENEMA | Freq: Once | RECTAL | Status: DC | PRN

## 2016-03-01 MED ORDER — ACETAMINOPHEN 650 MG RE SUPP: 650.0000 mg | RECTAL | Status: DC | PRN

## 2016-03-01 MED ORDER — ONDANSETRON HCL 4 MG/2ML IJ SOLN: 4.0000 mg | INTRAMUSCULAR | Status: DC | PRN

## 2016-03-01 MED ORDER — HYDROMORPHONE HCL 1 MG/ML IJ SOLN
0.2500 mg | INTRAMUSCULAR | Status: DC | PRN
  Administered 2016-03-01: 0.5 mg via INTRAVENOUS

## 2016-03-01 MED ORDER — METHOCARBAMOL 500 MG PO TABS
500.0000 mg | ORAL_TABLET | Freq: Three times a day (TID) | ORAL | 1 refills | Status: DC | PRN
Start: 2016-03-01 — End: 2016-04-19

## 2016-03-01 MED ORDER — SODIUM CHLORIDE 0.9% FLUSH: 3.0000 mL | INTRAVENOUS | Status: DC | PRN

## 2016-03-01 MED ORDER — LISINOPRIL-HYDROCHLOROTHIAZIDE 20-12.5 MG PO TABS: 1.0000 | ORAL_TABLET | Freq: Every day | ORAL | Status: DC

## 2016-03-01 MED ORDER — PROPOFOL 10 MG/ML IV BOLUS
INTRAVENOUS | Status: DC | PRN
  Administered 2016-03-01: 200 mg via INTRAVENOUS

## 2016-03-01 MED ORDER — SODIUM CHLORIDE 0.9% FLUSH
3.0000 mL | Freq: Two times a day (BID) | INTRAVENOUS | Status: DC
  Administered 2016-03-01: 3 mL via INTRAVENOUS

## 2016-03-01 MED ORDER — LIDOCAINE 2% (20 MG/ML) 5 ML SYRINGE
INTRAMUSCULAR | Status: AC
  Filled 2016-03-01: qty 5

## 2016-03-01 MED ORDER — CHLORHEXIDINE GLUCONATE 4 % EX LIQD: 60.0000 mL | Freq: Once | CUTANEOUS | Status: DC

## 2016-03-01 MED ORDER — ZOLPIDEM TARTRATE 5 MG PO TABS: 5.0000 mg | ORAL_TABLET | Freq: Every evening | ORAL | Status: DC | PRN

## 2016-03-01 MED ORDER — PROPOFOL 10 MG/ML IV BOLUS
INTRAVENOUS | Status: AC
  Filled 2016-03-01: qty 40

## 2016-03-01 MED ORDER — DOCUSATE SODIUM 100 MG PO CAPS
100.0000 mg | ORAL_CAPSULE | Freq: Two times a day (BID) | ORAL | Status: DC
  Administered 2016-03-01 (×2): 100 mg via ORAL
  Filled 2016-03-01 (×2): qty 1

## 2016-03-01 MED ORDER — SCOPOLAMINE 1 MG/3DAYS TD PT72
MEDICATED_PATCH | TRANSDERMAL | Status: DC | PRN
  Administered 2016-03-01: 1 via TRANSDERMAL

## 2016-03-01 MED ORDER — HYDROCODONE-ACETAMINOPHEN 7.5-325 MG PO TABS: 1.0000 | ORAL_TABLET | ORAL | 0 refills | Status: DC | PRN

## 2016-03-01 MED ORDER — 0.9 % SODIUM CHLORIDE (POUR BTL) OPTIME
TOPICAL | Status: DC | PRN
  Administered 2016-03-01: 1000 mL

## 2016-03-01 MED ORDER — FENTANYL CITRATE (PF) 100 MCG/2ML IJ SOLN
INTRAMUSCULAR | Status: AC
  Filled 2016-03-01: qty 4

## 2016-03-01 MED ORDER — ACETAMINOPHEN 325 MG PO TABS: 650.0000 mg | ORAL_TABLET | ORAL | Status: DC | PRN

## 2016-03-01 MED ORDER — SUCCINYLCHOLINE CHLORIDE 200 MG/10ML IV SOSY
PREFILLED_SYRINGE | INTRAVENOUS | Status: DC | PRN
  Administered 2016-03-01: 120 mg via INTRAVENOUS

## 2016-03-01 MED ORDER — LACTATED RINGERS IV SOLN
INTRAVENOUS | Status: DC | PRN
  Administered 2016-03-01 (×2): via INTRAVENOUS

## 2016-03-01 MED ORDER — ONDANSETRON HCL 4 MG/2ML IJ SOLN
INTRAMUSCULAR | Status: DC | PRN
  Administered 2016-03-01: 4 mg via INTRAVENOUS

## 2016-03-01 MED ORDER — ROSUVASTATIN CALCIUM 20 MG PO TABS
40.0000 mg | ORAL_TABLET | Freq: Every day | ORAL | Status: DC
  Administered 2016-03-01: 40 mg via ORAL
  Filled 2016-03-01: qty 2

## 2016-03-01 MED ORDER — ALBUTEROL SULFATE (2.5 MG/3ML) 0.083% IN NEBU: 3.0000 mL | INHALATION_SOLUTION | Freq: Four times a day (QID) | RESPIRATORY_TRACT | Status: DC | PRN

## 2016-03-01 MED ORDER — PROMETHAZINE HCL 25 MG/ML IJ SOLN: 6.2500 mg | INTRAMUSCULAR | Status: DC | PRN

## 2016-03-01 MED ORDER — FUROSEMIDE 40 MG PO TABS
40.0000 mg | ORAL_TABLET | Freq: Every day | ORAL | Status: DC
  Administered 2016-03-01: 40 mg via ORAL
  Filled 2016-03-01: qty 1

## 2016-03-01 MED ORDER — SUCCINYLCHOLINE CHLORIDE 200 MG/10ML IV SOSY
PREFILLED_SYRINGE | INTRAVENOUS | Status: AC
  Filled 2016-03-01: qty 10

## 2016-03-01 MED ORDER — POTASSIUM CHLORIDE IN NACL 20-0.9 MEQ/L-% IV SOLN: INTRAVENOUS | Status: DC

## 2016-03-01 MED ORDER — BUPIVACAINE LIPOSOME 1.3 % IJ SUSP
20.0000 mL | INTRAMUSCULAR | Status: DC
  Filled 2016-03-01: qty 20

## 2016-03-01 MED ORDER — FENTANYL CITRATE (PF) 100 MCG/2ML IJ SOLN
INTRAMUSCULAR | Status: DC | PRN
  Administered 2016-03-01 (×4): 50 ug via INTRAVENOUS

## 2016-03-01 MED ORDER — ROCURONIUM BROMIDE 100 MG/10ML IV SOLN
INTRAVENOUS | Status: DC | PRN
  Administered 2016-03-01: 50 mg via INTRAVENOUS

## 2016-03-01 MED ORDER — PHENOL 1.4 % MT LIQD
1.0000 | OROMUCOSAL | Status: DC | PRN
Start: 2016-03-01 — End: 2016-03-02

## 2016-03-01 MED ORDER — MIDAZOLAM HCL 5 MG/5ML IJ SOLN
INTRAMUSCULAR | Status: DC | PRN
  Administered 2016-03-01: 2 mg via INTRAVENOUS

## 2016-03-01 MED ORDER — PANTOPRAZOLE SODIUM 40 MG IV SOLR
40.0000 mg | Freq: Every day | INTRAVENOUS | Status: DC
  Administered 2016-03-01: 40 mg via INTRAVENOUS
  Filled 2016-03-01: qty 40

## 2016-03-01 MED ORDER — MULTIVITAMINS PO CAPS: 1.0000 | ORAL_CAPSULE | Freq: Every day | ORAL | Status: DC

## 2016-03-01 MED ORDER — DEXAMETHASONE SODIUM PHOSPHATE 10 MG/ML IJ SOLN
INTRAMUSCULAR | Status: DC | PRN
  Administered 2016-03-01: 10 mg via INTRAVENOUS

## 2016-03-01 MED ORDER — THROMBIN 5000 UNITS EX SOLR
CUTANEOUS | Status: AC
  Filled 2016-03-01: qty 5000

## 2016-03-01 MED ORDER — SODIUM CHLORIDE 0.9 % IV SOLN: 250.0000 mL | INTRAVENOUS | Status: DC

## 2016-03-01 MED ORDER — PHENYLEPHRINE HCL 10 MG/ML IJ SOLN
INTRAVENOUS | Status: DC | PRN
  Administered 2016-03-01: 20 ug/min via INTRAVENOUS

## 2016-03-01 MED ORDER — BUPIVACAINE LIPOSOME 1.3 % IJ SUSP
INTRAMUSCULAR | Status: DC | PRN
  Administered 2016-03-01: 20 mL

## 2016-03-01 MED ORDER — BUPIVACAINE HCL (PF) 0.5 % IJ SOLN
INTRAMUSCULAR | Status: AC
  Filled 2016-03-01: qty 30

## 2016-03-01 MED ORDER — PIROXICAM 10 MG PO CAPS
10.0000 mg | ORAL_CAPSULE | Freq: Two times a day (BID) | ORAL | Status: DC | PRN
  Filled 2016-03-01: qty 1

## 2016-03-01 MED ORDER — ADULT MULTIVITAMIN W/MINERALS CH: 1.0000 | ORAL_TABLET | Freq: Every day | ORAL | Status: DC

## 2016-03-01 MED ORDER — CYCLOBENZAPRINE HCL 5 MG PO TABS: 5.0000 mg | ORAL_TABLET | Freq: Three times a day (TID) | ORAL | Status: DC | PRN

## 2016-03-01 MED ORDER — LIDOCAINE HCL (CARDIAC) 20 MG/ML IV SOLN
INTRAVENOUS | Status: DC | PRN
  Administered 2016-03-01: 60 mg via INTRAVENOUS

## 2016-03-01 MED ORDER — SUGAMMADEX SODIUM 500 MG/5ML IV SOLN
INTRAVENOUS | Status: DC | PRN
  Administered 2016-03-01: 230 mg via INTRAVENOUS

## 2016-03-01 MED ORDER — CEFAZOLIN IN D5W 1 GM/50ML IV SOLN
1.0000 g | Freq: Three times a day (TID) | INTRAVENOUS | Status: AC
  Administered 2016-03-01 (×2): 1 g via INTRAVENOUS
  Filled 2016-03-01 (×2): qty 50

## 2016-03-01 MED ORDER — MORPHINE SULFATE (PF) 4 MG/ML IV SOLN: 1.0000 mg | INTRAVENOUS | Status: DC | PRN

## 2016-03-01 MED ORDER — METHOCARBAMOL 500 MG PO TABS
ORAL_TABLET | ORAL | Status: AC
  Filled 2016-03-01: qty 1

## 2016-03-01 MED ORDER — METHOCARBAMOL 500 MG PO TABS
500.0000 mg | ORAL_TABLET | Freq: Four times a day (QID) | ORAL | Status: DC | PRN
  Administered 2016-03-01 – 2016-03-02 (×3): 500 mg via ORAL
  Filled 2016-03-01 (×2): qty 1

## 2016-03-01 MED ORDER — HYDROCODONE-ACETAMINOPHEN 7.5-325 MG PO TABS
1.0000 | ORAL_TABLET | ORAL | Status: DC | PRN
  Administered 2016-03-01 – 2016-03-02 (×4): 2 via ORAL
  Filled 2016-03-01 (×4): qty 2

## 2016-03-01 MED ORDER — OMEGA-3 FATTY ACIDS 1000 MG PO CAPS: 1.0000 g | ORAL_CAPSULE | Freq: Every day | ORAL | Status: DC

## 2016-03-01 MED ORDER — ROCURONIUM BROMIDE 50 MG/5ML IV SOSY
PREFILLED_SYRINGE | INTRAVENOUS | Status: AC
  Filled 2016-03-01: qty 5

## 2016-03-01 MED ORDER — HYDROCHLOROTHIAZIDE 12.5 MG PO CAPS
12.5000 mg | ORAL_CAPSULE | Freq: Every day | ORAL | Status: DC
  Administered 2016-03-01: 12.5 mg via ORAL
  Filled 2016-03-01: qty 1

## 2016-03-01 MED ORDER — MENTHOL 3 MG MT LOZG: 1.0000 | LOZENGE | OROMUCOSAL | Status: DC | PRN

## 2016-03-01 MED ORDER — BISACODYL 5 MG PO TBEC: 5.0000 mg | DELAYED_RELEASE_TABLET | Freq: Every day | ORAL | Status: DC | PRN

## 2016-03-01 MED ORDER — MEPERIDINE HCL 25 MG/ML IJ SOLN: 6.2500 mg | INTRAMUSCULAR | Status: DC | PRN

## 2016-03-01 MED ORDER — DEXTROSE 5 % IV SOLN: 500.0000 mg | Freq: Four times a day (QID) | INTRAVENOUS | Status: DC | PRN

## 2016-03-01 MED ORDER — CEFAZOLIN SODIUM-DEXTROSE 2-4 GM/100ML-% IV SOLN
2.0000 g | INTRAVENOUS | Status: AC
  Administered 2016-03-01: 2 g via INTRAVENOUS
  Filled 2016-03-01: qty 100

## 2016-03-01 MED ORDER — THROMBIN 20000 UNITS EX KIT
PACK | CUTANEOUS | Status: AC
  Filled 2016-03-01: qty 1

## 2016-03-01 MED ORDER — POLYETHYLENE GLYCOL 3350 17 G PO PACK: 17.0000 g | PACK | Freq: Every day | ORAL | Status: DC | PRN

## 2016-03-01 MED ORDER — HEMOSTATIC AGENTS (NO CHARGE) OPTIME
TOPICAL | Status: DC | PRN
  Administered 2016-03-01: 1 via TOPICAL

## 2016-03-01 MED ORDER — OMEGA-3-ACID ETHYL ESTERS 1 G PO CAPS
1.0000 g | ORAL_CAPSULE | Freq: Every day | ORAL | Status: DC
  Administered 2016-03-01: 1 g via ORAL
  Filled 2016-03-01 (×2): qty 1

## 2016-03-01 MED ORDER — KETOROLAC TROMETHAMINE 30 MG/ML IJ SOLN
INTRAMUSCULAR | Status: AC
  Filled 2016-03-01: qty 1

## 2016-03-01 MED ORDER — THROMBIN 20000 UNITS EX KIT
PACK | CUTANEOUS | Status: DC | PRN
  Administered 2016-03-01: 20000 [IU] via TOPICAL

## 2016-03-01 MED ORDER — KETOROLAC TROMETHAMINE 30 MG/ML IJ SOLN
30.0000 mg | Freq: Once | INTRAMUSCULAR | Status: AC
  Administered 2016-03-01: 30 mg via INTRAVENOUS

## 2016-03-01 MED ORDER — HYDROMORPHONE HCL 1 MG/ML IJ SOLN
INTRAMUSCULAR | Status: AC
  Filled 2016-03-01: qty 0.5

## 2016-03-01 MED ORDER — MIDAZOLAM HCL 2 MG/2ML IJ SOLN
INTRAMUSCULAR | Status: AC
  Filled 2016-03-01: qty 2

## 2016-03-01 MED ORDER — OXYCODONE-ACETAMINOPHEN 5-325 MG PO TABS: 1.0000 | ORAL_TABLET | ORAL | Status: DC | PRN

## 2016-03-01 MED ORDER — LISINOPRIL 20 MG PO TABS
20.0000 mg | ORAL_TABLET | Freq: Every day | ORAL | Status: DC
  Administered 2016-03-01: 20 mg via ORAL
  Filled 2016-03-01: qty 1

## 2016-03-01 SURGICAL SUPPLY — 58 items
ADH SKN CLS APL DERMABOND .7 (GAUZE/BANDAGES/DRESSINGS) ×1
BUR RND FLUTED 2.5 (BURR) IMPLANT
BUR SABER RD CUTTING 3.0 (BURR) ×2 IMPLANT
CANISTER SUCTION 2500CC (MISCELLANEOUS) ×2 IMPLANT
CLSR STERI-STRIP ANTIMIC 1/2X4 (GAUZE/BANDAGES/DRESSINGS) ×2 IMPLANT
COVER SURGICAL LIGHT HANDLE (MISCELLANEOUS) ×2 IMPLANT
DERMABOND ADVANCED (GAUZE/BANDAGES/DRESSINGS) ×1
DERMABOND ADVANCED .7 DNX12 (GAUZE/BANDAGES/DRESSINGS) ×1 IMPLANT
DRAPE HALF SHEET 40X57 (DRAPES) ×2 IMPLANT
DRAPE INCISE IOBAN 66X45 STRL (DRAPES) ×2 IMPLANT
DRAPE MICROSCOPE LEICA (MISCELLANEOUS) ×2 IMPLANT
DRAPE PROXIMA HALF (DRAPES) ×2 IMPLANT
DRAPE SURG 17X23 STRL (DRAPES) ×8 IMPLANT
DRSG MEPILEX BORDER 4X4 (GAUZE/BANDAGES/DRESSINGS) IMPLANT
DRSG MEPILEX BORDER 4X8 (GAUZE/BANDAGES/DRESSINGS) ×2 IMPLANT
DURAPREP 26ML APPLICATOR (WOUND CARE) ×2 IMPLANT
ELECT REM PT RETURN 9FT ADLT (ELECTROSURGICAL) ×2
ELECTRODE REM PT RTRN 9FT ADLT (ELECTROSURGICAL) ×1 IMPLANT
EVACUATOR 1/8 PVC DRAIN (DRAIN) IMPLANT
GLOVE BIOGEL PI IND STRL 6.5 (GLOVE) ×2 IMPLANT
GLOVE BIOGEL PI IND STRL 8 (GLOVE) ×1 IMPLANT
GLOVE BIOGEL PI INDICATOR 6.5 (GLOVE) ×2
GLOVE BIOGEL PI INDICATOR 8 (GLOVE) ×1
GLOVE ECLIPSE 7.5 STRL STRAW (GLOVE) ×2 IMPLANT
GLOVE ECLIPSE 9.0 STRL (GLOVE) ×2 IMPLANT
GLOVE ORTHO TXT STRL SZ7.5 (GLOVE) ×2 IMPLANT
GLOVE SURG 8.5 LATEX PF (GLOVE) ×2 IMPLANT
GLOVE SURG SS PI 6.5 STRL IVOR (GLOVE) ×2 IMPLANT
GOWN STRL REUS W/ TWL LRG LVL3 (GOWN DISPOSABLE) ×2 IMPLANT
GOWN STRL REUS W/TWL 2XL LVL3 (GOWN DISPOSABLE) ×4 IMPLANT
GOWN STRL REUS W/TWL LRG LVL3 (GOWN DISPOSABLE) ×4
KIT BASIN OR (CUSTOM PROCEDURE TRAY) ×2 IMPLANT
KIT ROOM TURNOVER OR (KITS) ×2 IMPLANT
NEEDLE SPNL 18GX3.5 QUINCKE PK (NEEDLE) ×4 IMPLANT
NS IRRIG 1000ML POUR BTL (IV SOLUTION) ×2 IMPLANT
PACK LAMINECTOMY ORTHO (CUSTOM PROCEDURE TRAY) ×2 IMPLANT
PAD ARMBOARD 7.5X6 YLW CONV (MISCELLANEOUS) ×4 IMPLANT
PATTIES SURGICAL .5 X.5 (GAUZE/BANDAGES/DRESSINGS) ×2 IMPLANT
PATTIES SURGICAL .75X.75 (GAUZE/BANDAGES/DRESSINGS) IMPLANT
PATTIES SURGICAL 1X1 (DISPOSABLE) IMPLANT
SPONGE LAP 4X18 X RAY DECT (DISPOSABLE) IMPLANT
SPONGE SURGIFOAM ABS GEL 100 (HEMOSTASIS) ×2 IMPLANT
SUT VIC AB 0 CT1 27 (SUTURE) ×2
SUT VIC AB 0 CT1 27XBRD ANBCTR (SUTURE) ×1 IMPLANT
SUT VIC AB 0 CTX 36 (SUTURE) ×2
SUT VIC AB 0 CTX36XBRD ANTBCTR (SUTURE) ×1 IMPLANT
SUT VIC AB 1 CT1 27 (SUTURE)
SUT VIC AB 1 CT1 27XBRD ANBCTR (SUTURE) IMPLANT
SUT VIC AB 1 CTX 36 (SUTURE) ×2
SUT VIC AB 1 CTX36XBRD ANBCTR (SUTURE) ×1 IMPLANT
SUT VIC AB 2-0 CT1 27 (SUTURE)
SUT VIC AB 2-0 CT1 TAPERPNT 27 (SUTURE) IMPLANT
SUT VIC AB 3-0 X1 27 (SUTURE) ×2 IMPLANT
SUT VICRYL 0 UR6 27IN ABS (SUTURE) ×2 IMPLANT
TOWEL OR 17X24 6PK STRL BLUE (TOWEL DISPOSABLE) ×2 IMPLANT
TOWEL OR 17X26 10 PK STRL BLUE (TOWEL DISPOSABLE) ×2 IMPLANT
TRAY FOLEY CATH 16FRSI W/METER (SET/KITS/TRAYS/PACK) IMPLANT
WATER STERILE IRR 1000ML POUR (IV SOLUTION) ×2 IMPLANT

## 2016-03-01 NOTE — Discharge Instructions (Signed)
° ° °  No lifting greater than 10 lbs. °Avoid bending, stooping and twisting. °Walk in house for first week them may start to get out slowly increasing distance up to one quarter mile by 3 weeks post op. °Keep incision dry for 3 days, may use tegaderm or similar water impervious dressing. ° °

## 2016-03-01 NOTE — Evaluation (Signed)
Physical Therapy Evaluation Patient Details Name: Kelly Roth MRN: [id number] DOB: [birth year] Today's Date: 03/01/2016   History of Present Illness  Patient is a 65 y/o female with hx of COPD, HTN, HLD presents s/p left and central L3-4 and L4-5 DECOMPRESSIVE LUMBAR LAMINECTOMY FOR SPINAL STENOSIS   Clinical Impression  Patient presents with pain and post surgical deficits s/p above surgery. Tolerated transfers and gait training with min guard assist for safety. Pt used SPC for gait training today but recommend use of RW due to need for BUE support. Education re: back precautions for good body mechanics, log roll technique, mobility etc. Pt has support from spouse and mother at home. Will need to perform stair training tomorrow prior to d/c. Will follow acutely to maximize independence and mobility prior to return home.     Follow Up Recommendations No PT follow up;Supervision - Intermittent    Equipment Recommendations  None recommended by PT    Recommendations for Other Services       Precautions / Restrictions Precautions Precautions: Back Precaution Booklet Issued: No Precaution Comments: reviewed precautions Restrictions Weight Bearing Restrictions: No      Mobility  Bed Mobility Overal bed mobility: Needs Assistance Bed Mobility: Rolling;Sidelying to Sit;Sit to Sidelying Rolling: Modified independent (Device/Increase time) Sidelying to sit: Modified independent (Device/Increase time)     Sit to sidelying: Modified independent (Device/Increase time) General bed mobility comments: HOB flat, no use of rails to simulate home. Cues for log roll technique.   Transfers Overall transfer level: Needs assistance Equipment used: Straight cane Transfers: Sit to/from Stand Sit to Stand: Supervision         General transfer comment: Supervision for safety. Stood from [employer], from toilet x1.   Ambulation/Gait Ambulation/Gait assistance: Min guard Ambulation  Distance (Feet): 100 Feet Assistive device: Straight cane Gait Pattern/deviations: Step-through pattern;Decreased stride length;Trunk flexed;Wide base of support Gait velocity: decreased Gait velocity interpretation: Below normal speed for age/gender General Gait Details: Slow, unsteady gait with pt using SPC for 1 UE support and reaching for rail for other UE support. Recommend using RW. DOE. 2 standing rest breaks. HR up to 125 bm.  Stairs            Wheelchair Mobility    Modified Rankin (Stroke Patients Only)       Balance Overall balance assessment: Needs assistance Sitting-balance support: Feet supported;No upper extremity supported Sitting balance-Leahy Scale: Fair     Standing balance support: During functional activity Standing balance-Leahy Scale: Fair                               Pertinent Vitals/Pain Pain Assessment: Faces Faces Pain Scale: Hurts little more Pain Location: back  Pain Descriptors / Indicators: Sore;Operative site guarding Pain Intervention(s): Monitored during session;Repositioned    Home Living Family/patient expects to be discharged to:: Private residence Living Arrangements: Spouse/significant other;Parent Available Help at Discharge: Family;Available 24 hours/day Type of Home: House Home Access: Stairs to enter Entrance Stairs-Rails: Right Entrance Stairs-Number of Steps: 4-5 Home Layout: One level Home Equipment: Walker - 2 wheels;Walker - 4 wheels;Cane - single point      Prior Function Level of Independence: Independent         Comments: Goes to visit her mother in [place] for months at a time.     Hand Dominance        Extremity/Trunk Assessment   Upper Extremity Assessment Upper Extremity Assessment: Defer to  OT evaluation    Lower Extremity Assessment Lower Extremity Assessment: Generalized weakness (No numbness/tingling)       Communication   Communication: No difficulties  Cognition  Arousal/Alertness: Awake/alert Behavior During Therapy: WFL for tasks assessed/performed Overall Cognitive Status: Within Functional Limits for tasks assessed                      General Comments      Exercises     Assessment/Plan    PT Assessment Patient needs continued PT services  PT Problem List Decreased strength;Decreased mobility;Decreased balance;Pain;Decreased activity tolerance;Cardiopulmonary status limiting activity;Decreased knowledge of precautions          PT Treatment Interventions Therapeutic activities;Gait training;Therapeutic exercise;Patient/family education;Balance training;Stair training;Functional mobility training    PT Goals (Current goals can be found in the Care Plan section)  Acute Rehab PT Goals Patient Stated Goal: to be able to stand up and do dishes without pain PT Goal Formulation: With patient Time For Goal Achievement: 03/15/16 Potential to Achieve Goals: Good    Frequency Min 5X/week   Barriers to discharge Inaccessible home environment stairs to enter home    Co-evaluation               End of Session Equipment Utilized During Treatment: Gait belt Activity Tolerance: Patient tolerated treatment well Patient left: in bed;with call bell/phone within reach Nurse Communication: Mobility status    Functional Assessment Tool Used: clinical judgment Functional Limitation: Mobility: Walking and moving around Mobility: Walking and Moving Around Current Status [id number]): At least 1 percent but less than 20 percent impaired, limited or restricted Mobility: Walking and Moving Around Goal Status [phone]): At least 1 percent but less than 20 percent impaired, limited or restricted    Time: [id number] PT Time Calculation (min) (ACUTE ONLY): 21 min   Charges:   PT Evaluation $PT Eval Low Complexity: 1 Procedure     PT G Codes:   PT G-Codes **NOT FOR INPATIENT CLASS** Functional Assessment Tool Used: clinical  judgment Functional Limitation: Mobility: Walking and moving around Mobility: Walking and Moving Around Current Status [id number]): At least 1 percent but less than 20 percent impaired, limited or restricted Mobility: Walking and Moving Around Goal Status [phone]): At least 1 percent but less than 20 percent impaired, limited or restricted    [place] 03/01/2016, 4:35 PM Wray Kearns, [place], DPT [phone]

## 2016-03-01 NOTE — Op Note (Addendum)
03/01/2016  10:29 AM  PATIENT:  Kelly Roth  65 y.o. female  MRN: [id number]  OPERATIVE REPORT  PRE-OPERATIVE DIAGNOSIS:  bilateral L3-4 and left L4-5 spinal stenosis  POST-OPERATIVE DIAGNOSIS:  bilateral L3-4 and left L4-5 spinal stenosis  PROCEDURE:  Procedure(s): LEFT AND CENTRAL L3-4 AND LEFT L4-5 DECOMPRESSIVE LUMBAR LAMINECTOMY FOR SPINAL STENOSIS    SURGEON:  Jessy Oto, MD     ASSISTANT:  Benjiman Core, PA-C  (Present throughout the entire procedure and necessary for completion of procedure in a timely manner)     ANESTHESIA:  General,supplemented with local marcaine 0.5% 1:1 exparel 1.3% total 25cc, Dr. Jillyn Hidden  EBL: <100cc  DRAINS: Foley to SD.    COMPLICATIONS:  None.      PROCEDURE:The patient was met in the holding area, and the appropriate left Lumbar level L3-4 and L4-5 identified and marked with "x" and my initials.The patient was then transported to OR and was placed under general anesthesia without difficulty. The patient received appropriate preoperative antibiotic prophylaxis. Foley catheter was placed sterilely. The patient after intubation atraumatically was transferred to the operating room table, prone position, Wilson frame, sliding OR table. All pressure points were well padded. The arms in 90-90 well-padded at the elbows. Standard prep with DuraPrep solution lower dorsal spine to the mid sacral segment. Draped in the usual manner iodine Vi-Drape was used. Time-out procedure was called and correct. 2x 18-gauge spinal needle was then inserted at the expected L4 level. Cross table lateral radiograph was used to identify the spinal needles positions. The needle was at the lower aspect of the lamina of S1.  Needles were reinserted above previous level and repeat radiographs showed the needle directed at L3 and L4. Skin was then infiltrated with Marcaine half percent 1: exparel 1.3% total of 15 cc used. An incision approximately an inch inch and a half in  length was then made through skin and subcutaneous layers in line with the left side of the expected midline just superior to the spinal needle entry point. An incision made into the left lumbosacral fascia approximately an inch in length .  Cobb elevator was then introduced into the incision site and used to carefully form subperiosteal movement of the hip paralumbar muscles off of the posterior lamina of the expected L3-4 and L4-5 level. The depth measured off of the Cobb elevator at about 80 mm and 80 mm retractors and placed on the scaffolding for the Boss McCoullough retractor and guided down to and docking on the posterior aspect of the lamina at the expected L4-5 level.Cross table lateral radiograph was used to identified a Penfield #4 and a Hockey stick neuroprobe at the L2-3 and L3-4 levels. Using headlight and loope magnification the left L3-4 level was exposed and debrided of overlying soft tissue. The operating room microscope sterilely draped brought into the field. Under the operating room microscope, the left L3-4 interspace carefully debrided the small amount of muscle attachment here and high-speed bur used to drill the medial aspect of the inferior articular process of L3 approximately 10%. 2 mm Kerrison then used to enter the spinal canal over the superior aspect of the L4 lamina carefully using the Kerrison to debris the attachment as a curet. Foraminotomy was then performed over the L4 nerve root. A localization lateral radiograph view was obtained with Penfield 4 over the posterior L3-4 disc level just adjacent to the superior aspect of the L4 pedicle. The medial 10% superior articular process of L4 and then  resected using an osteotome and 2 mm Kerrison. This allowed for identification of the thecal sac. Penfield 4 was then used to carefully mobilize the thecal sac medially and the L4 nerve root identified within the lateral recess flattened over the posterior aspect of the bulging disc.  Carefully the lateral aspect of the L4 nerve root was identified and a Penfield 4 was used to mobilize the nerve medially such that the L3-4 disc was visible with microscope and not herniated. Using a Penfield 4 for retraction. Further foraminotomies was performed over the L3 an L4 nerve roots, the nerve roots were noted to be decompressed. The L4 nerve root able to be retracted along the medial aspect of the L4 pedicle and the reflected ligamentum flavum able to be resected.  Ligamentum flavum was further debrided superior to the level L3-4 disc. Had a moderate amount of further resection of the L3 lamina inferiorly was performed. The deep portions of the inferior aspect of the L3 spinous process then drilled with the high speed burr and the central ligamentum flavum resected centrally. Rolling the table the oppostite right L3-4 lateral recess and ligmentum flavum was able to be visualized. Using a woodson to retract the thecal sac the superficial flavum was able to be resected of the right side off the inferior portion of the right L3 lamina and medial right L3-4Ligamentum flavum was debrided and lateral recess along the medial aspect L3-4 facet no further decompression was necessary. Ball tip nerve probe was then able to carefully palpate the neuroforamen for L4 and L5 finding these to be well decompressed. Attention then turned to the left L4-5 level which was easily visualized with the microscope. Soft tissues debrided about the posterior aspect of the L4-5 interspace. High-speed bur and then used to carefully drill inferior 3 or 4 mm of the left side L4 lamina and on the medial aspect of the left L4 inferior articular process of 3 mm. The superior margin of the L5 lamina then carefully debrided with curette and a 2 mm kerrison then used to enter the spinal canal over the superior aspect of the L4 lamina resecting bone over the superior aspect and freeing up the attachment of ligamentum flavum here. Ligamentum  flavum then debrided with the 2 mm and 3 mm Kerrisons we decompressed the L5 nerve root and the lateral recess left L4-5 decompressed using 2 and 3 mm Kerrisons sizing hypertrophic reflected ligamentum flavum extending superiorly. From was resected off the ventral aspect of the inferior margin of the L4 lamina. Hockey-stick nerve probe could then be passed out the L4 neuroforamen and the L5 neuroforamen. Venous bleeding encountered. Thrombin-soaked Gelfoam used to control this following this then the sac and the L5 nerve root were mobilized medially and the L4-5 disc examined and found not to be herniated. Irrigation was carried out down to this bleeding controlled with Gelfoam. Gelfoam was then removed. Irrigation carried careful examination demonstrated no active bleeding present. Retractors were then carefully removed Since carefully then the Bleeding was then controlled using thrombin-soaked Gelfoam small cottonoids. Small amount of bleeding within the soft tissue mass the laminotomy area was controlled using bipolar electrocautery. Irrigation was carried out using copious amounts of irrigant solution. All Gelfoam were then removed. No significant active bleeding present at the time of removal. All instruments sponge counts were correct traction system was then carefully removed carefully rotating retractors with this withdrawal and only bipolar electrocautery of any small bleeders. Lumbodorsal fascia was then carefully approximated with interrupted  0 Vicryl sutures, UR 6 needle deep subcutaneous layers were approximated with interrupted 0 Vicryl sutures on UR 6 the appear subcutaneous layers approximated with interrupted 2-0 Vicryl sutures and the skin closed with a running subcutaneous stitch of 4-0 Vicryl. Dermabond was applied allowed to dry and then Mepilex bandage applied. Patient was then carefully returned to supine position on a stretcher, reactivated and extubated. He was then returned to recovery room  in satisfactory condition.  Benjiman Core, PA-C perform the duties of assistant surgeon during this case. he was present from the beginning of the case to the end of the case assisting in transfer the patient from his stretcher to the OR table and back to the stretcher at the end of the case. Assisted in careful retraction and suction of the laminectomy site delicate neural structures operating under the operating room microscope. He performed closure of the incision from the fascia to the skin applying the dressing.     Jessy Oto  03/01/2016, 10:29 AM

## 2016-03-01 NOTE — Transfer of Care (Signed)
Immediate Anesthesia Transfer of Care Note  Patient: Kelly Roth  Procedure(s) Performed: Procedure(s): LEFT AND CENTRAL L3-4 AND LEFT L4-5 DECOMPRESSIVE LUMBAR LAMINECTOMY FOR SPINAL STENOSIS (N/A)  Patient Location: PACU  Anesthesia Type:General  Level of Consciousness: awake, alert  and oriented  Airway & Oxygen Therapy: Patient Spontanous Breathing and Patient connected to nasal cannula oxygen  Post-op Assessment: Report given to RN and Post -op Vital signs reviewed and stable  Post vital signs: Reviewed and stable  Last Vitals:  Vitals:   03/01/16 0622  BP: (!) 160/85  Pulse: (!) 105  Resp: 20  Temp: 36.6 C    Last Pain:  Vitals:   03/01/16 0622  TempSrc: Oral      Patients Stated Pain Goal: 3 ([id number])  Complications: No apparent anesthesia complications

## 2016-03-01 NOTE — Anesthesia Procedure Notes (Signed)
Procedure Name: Intubation Date/Time: 03/01/2016 7:48 AM Performed by: Valda Favia Pre-anesthesia Checklist: Patient identified, Emergency Drugs available, Suction available, Patient being monitored and Timeout performed Patient Re-evaluated:Patient Re-evaluated prior to inductionOxygen Delivery Method: Circle system utilized Preoxygenation: Pre-oxygenation with 100% oxygen Intubation Type: IV induction Ventilation: Mask ventilation without difficulty and Oral airway inserted - appropriate to patient size Laryngoscope Size: Glidescope and 4 Grade View: Grade II Tube type: Oral Tube size: 7.5 mm Number of attempts: 1 Airway Equipment and Method: Stylet and Video-laryngoscopy Placement Confirmation: ETT inserted through vocal cords under direct vision,  positive ETCO2 and breath sounds checked- equal and bilateral Secured at: 22 cm Tube secured with: Tape Dental Injury: Teeth and Oropharynx as per pre-operative assessment  Difficulty Due To: Difficult Airway- due to anterior larynx Comments: DLx 1 by Edmonia James, CRNA grade IV view with MAC 4, mask ventilation with oral airway in place. DL x 2 by Edmonia James, CRNA with Glidescope 4 blade, grade II view oral ETT palced without difficulty.

## 2016-03-01 NOTE — Interval H&P Note (Signed)
History and Physical Interval Note:  03/01/2016 7:25 AM  Kelly Roth  has presented today for surgery, with the diagnosis of bilateral L3-4 and left L4-5 spinal stenosis  The various methods of treatment have been discussed with the patient and family. After consideration of risks, benefits and other options for treatment, the patient has consented to  Procedure(s): LEFT AND CENTRAL L3-4 AND LEFT L4-5 DECOMPRESSIVE LUMBAR LAMINECTOMY FOR SPINAL STENOSIS (N/A) as a surgical intervention .  The patient's history has been reviewed, patient examined, no change in status, stable for surgery.  I have reviewed the patient's chart and labs.  Questions were answered to the patient's satisfaction.     Jessy Oto

## 2016-03-01 NOTE — Brief Op Note (Signed)
03/01/2016  9:37 AM  PATIENT:  Kelly Roth  65 y.o. female  PRE-OPERATIVE DIAGNOSIS:  bilateral L3-4 and left L4-5 spinal stenosis  POST-OPERATIVE DIAGNOSIS:  bilateral L3-4 and left L4-5 spinal stenosis  PROCEDURE:  Procedure(s): LEFT AND CENTRAL L3-4 AND LEFT L4-5 DECOMPRESSIVE LUMBAR LAMINECTOMY FOR SPINAL STENOSIS (N/A)  SURGEON:  Surgeon(s) and Role:    * Jessy Oto, MD - Primary  PHYSICIAN ASSISTANT:Emmerson Shuffield Owens,PA-C   ANESTHESIA:   local and general, supplemented with local marcaine 0.5% 1:1 exparel 1.3% total 25 cc, Dr. Jillyn Hidden  EBL: <100cc  BLOOD ADMINISTERED:none  DRAINS: Urinary Catheter (Foley)   LOCAL MEDICATIONS USED:Local marcaine 0.5% 1:1 exparel 1.3% total 25 cc  SPECIMEN:  No Specimen  DISPOSITION OF SPECIMEN:  N/A  COUNTS:  YES  DICTATION: .Dragon Dictation  PLAN OF CARE: Admit for overnight observation  PATIENT DISPOSITION:  PACU - hemodynamically stable.   Delay start of Pharmacological VTE agent (>24hrs) due to surgical blood loss or risk of bleeding: yes

## 2016-03-01 NOTE — Anesthesia Postprocedure Evaluation (Signed)
Anesthesia Post Note  Patient: Kelly Roth  Procedure(s) Performed: Procedure(s) (LRB): LEFT AND CENTRAL L3-4 AND LEFT L4-5 DECOMPRESSIVE LUMBAR LAMINECTOMY FOR SPINAL STENOSIS (N/A)  Anesthesia Post Evaluation      Last Vitals:  Vitals:   03/01/16 1040 03/01/16 1045  BP:    Pulse:  (!) 50  Resp:  19  Temp: 36.4 C     Last Pain:  Vitals:   03/01/16 1045  TempSrc:   PainSc: 0-No pain   Pain Goal: Patients Stated Pain Goal: 3 (03/01/16 [id number])  LLE Motor Response: Purposeful movement;Responds to commands (03/01/16 1045) LLE Sensation: Full sensation (03/01/16 1045) RLE Motor Response: Purposeful movement;Responds to commands (03/01/16 1045) RLE Sensation: Full sensation (03/01/16 1045)      [name]

## 2016-03-01 NOTE — Anesthesia Preprocedure Evaluation (Signed)
Anesthesia Evaluation  Patient identified by MRN, date of birth, ID band Patient awake    Reviewed: Allergy & Precautions, H&P , NPO status , Patient's Chart, lab work & pertinent test results, reviewed documented beta blocker date and time   Airway Mallampati: III  TM Distance: >3 FB Neck ROM: full    Dental no notable dental hx. (+) Poor Dentition,    Pulmonary former smoker,    Pulmonary exam normal breath sounds clear to auscultation       Cardiovascular Exercise Tolerance: Good hypertension, Pt. on medications Normal cardiovascular exam     Neuro/Psych negative neurological ROS     GI/Hepatic Neg liver ROS,   Endo/Other  negative endocrine ROS  Renal/GU   negative genitourinary   Musculoskeletal   Abdominal Normal abdominal exam  (+) + obese,   Peds  Hematology negative hematology ROS (+)   Anesthesia Other Findings   Reproductive/Obstetrics negative OB ROS                             Anesthesia Physical Anesthesia Plan  ASA: III  Anesthesia Plan: General   Post-op Pain Management:    Induction: Intravenous  Airway Management Planned: Oral ETT  Additional Equipment:   Intra-op Plan:   Post-operative Plan:   Informed Consent: I have reviewed the patients History and Physical, chart, labs and discussed the procedure including the risks, benefits and alternatives for the proposed anesthesia with the patient or authorized representative who has indicated his/her understanding and acceptance.   Dental Advisory Given and Dental advisory given  Plan Discussed with: CRNA and Surgeon  Anesthesia Plan Comments:         Anesthesia Quick Evaluation

## 2016-03-02 ENCOUNTER — Encounter (HOSPITAL_COMMUNITY): Payer: Self-pay | Admitting: Specialist

## 2016-03-02 DIAGNOSIS — M48062 Spinal stenosis, lumbar region with neurogenic claudication: Secondary | ICD-10-CM | POA: Diagnosis not present

## 2016-03-02 LAB — BASIC METABOLIC PANEL
ANION GAP: 7 (ref 5–15)
BUN: 14 mg/dL (ref 6–20)
CO2: 28 mmol/L (ref 22–32)
Calcium: 9.5 mg/dL (ref 8.9–10.3)
Chloride: 102 mmol/L (ref 101–111)
Creatinine, Ser: 0.86 mg/dL (ref 0.44–1.00)
GFR calc Af Amer: >60 mL/min (ref >60)
Glucose, Bld: 143 mg/dL — ABNORMAL HIGH (ref 65–99)
POTASSIUM: 4.3 mmol/L (ref 3.5–5.1)
SODIUM: 137 mmol/L (ref 135–145)

## 2016-03-02 LAB — CBC
HCT: 42.5 % (ref 36.0–46.0)
HEMOGLOBIN: 14 g/dL (ref 12.0–15.0)
MCH: 30.8 pg (ref 26.0–34.0)
MCHC: 32.9 g/dL (ref 30.0–36.0)
MCV: 93.6 fL (ref 78.0–100.0)
PLATELETS: 203 10*3/uL (ref 150–400)
RBC: 4.54 MIL/uL (ref 3.87–5.11)
RDW: 13 % (ref 11.5–15.5)
WBC: 20.9 10*3/uL — AB (ref 4.0–10.5)

## 2016-03-02 MED ORDER — PANTOPRAZOLE SODIUM 40 MG PO TBEC: 40.0000 mg | DELAYED_RELEASE_TABLET | Freq: Every day | ORAL | Status: DC

## 2016-03-02 NOTE — Progress Notes (Signed)
Physical Therapy Treatment Patient Details Name: Kelly Roth MRN: [id number] DOB: Apr 24, 1950 Today's Date: 03/02/2016    History of Present Illness Patient is a 65 y/o female with hx of COPD, HTN, HLD presents s/p left and central L3-4 and L4-5 DECOMPRESSIVE LUMBAR LAMINECTOMY FOR SPINAL STENOSIS     PT Comments    Pt progressing towards physical therapy goals. Was able to perform transfers, ambulation, and stair training with min guard to supervision for safety. Pt appeared to get dyspneic with ambulation. Anticipates d/c home today and pt was educated on car transfer, entrance into house, and general safety.   Follow Up Recommendations  No PT follow up;Supervision - Intermittent     Equipment Recommendations  None recommended by PT    Recommendations for Other Services       Precautions / Restrictions Precautions Precautions: Back;Fall Precaution Comments: Reviewed precautions verbally during functional mobility Restrictions Weight Bearing Restrictions: No    Mobility  Bed Mobility               General bed mobility comments: Pt sitting up in recliner upon PT arrival   Transfers Overall transfer level: Needs assistance Equipment used: Rolling walker (2 wheeled) Transfers: Sit to/from Stand Sit to Stand: Supervision         General transfer comment: Pt wanted to use RW today - able to stand with supervision for safety. No unsteadiness noted.   Ambulation/Gait Ambulation/Gait assistance: Min guard Ambulation Distance (Feet): 100 Feet Assistive device: Rolling walker (2 wheeled) Gait Pattern/deviations: Step-through pattern;Decreased stride length;Trunk flexed;Wide base of support Gait velocity: Decreased Gait velocity interpretation: Below normal speed for age/gender General Gait Details: VC's for improved posture and proper RW positioning. Pt with DOE and asking to sit down when we got to the stairs. Decreased safety awareness - does not seem to  think sitting down on the stairs would be breaking precautions.    Stairs Stairs: Yes   Stair Management: Two rails;Step to pattern;Forwards Number of Stairs: 4 General stair comments: VC's for sequencing and general safety awareness. Close hands-on guarding for safety.   Wheelchair Mobility    Modified Rankin (Stroke Patients Only)       Balance Overall balance assessment: Needs assistance Sitting-balance support: Feet supported;No upper extremity supported Sitting balance-Leahy Scale: Fair     Standing balance support: During functional activity Standing balance-Leahy Scale: Fair                      Cognition Arousal/Alertness: Awake/alert Behavior During Therapy: WFL for tasks assessed/performed Overall Cognitive Status: Within Functional Limits for tasks assessed                      Exercises      General Comments        Pertinent Vitals/Pain Pain Assessment: 0-10 Pain Score: 6  Pain Location: Incision site Pain Descriptors / Indicators: Sore;Operative site guarding Pain Intervention(s): Limited activity within patient's tolerance;Monitored during session;Repositioned    Home Living                      Prior Function            PT Goals (current goals can now be found in the care plan section) Acute Rehab PT Goals Patient Stated Goal: to be able to stand up and do dishes without pain PT Goal Formulation: With patient Time For Goal Achievement: 03/15/16 Potential to Achieve Goals: Good Progress towards PT  goals: Progressing toward goals    Frequency    Min 5X/week      PT Plan Current plan remains appropriate    Co-evaluation             End of Session Equipment Utilized During Treatment: Gait belt Activity Tolerance: Patient tolerated treatment well Patient left: in bed;with call bell/phone within reach     Time: 0848-0905 PT Time Calculation (min) (ACUTE ONLY): 17 min  Charges:  $Gait Training:  8-22 mins                    G Codes:      [name] 03/21/16, 9:15 AM   [name], PT, DPT Acute Rehabilitation Services Pager: [phone]

## 2016-03-02 NOTE — Progress Notes (Signed)
Subjective: Doing well.  Pain control.  preop leg symptoms greatly improved.  Did well with PT.  Wants to go home.    Objective: Vital signs in last 24 hours: Temp:  [97.5 F (36.4 C)-98.4 F (36.9 C)] 98.2 F (36.8 C) (12/20 0355) Pulse Rate:  [50-102] 94 (12/20 0355) Resp:  [17-20] 20 (12/20 0355) BP: (112-176)/(59-94) 125/84 (12/20 0355) SpO2:  [93 %-98 %] 93 % (12/20 0355)  Intake/Output from previous day: 12/19 0701 - 12/20 0700 In: 1550 [P.O.:100; I.V.:1400; IV Piggyback:50] Out: 800 [Urine:725; Blood:75] Intake/Output this shift: No intake/output data recorded.   Recent Labs  03/02/16 0158  HGB 14.0    Recent Labs  03/02/16 0158  WBC 20.9*  RBC 4.54  HCT 42.5  PLT 203    Recent Labs  03/02/16 0158  NA 137  K 4.3  CL 102  CO2 28  BUN 14  CREATININE 0.86  GLUCOSE 143*  CALCIUM 9.5   No results for input(s): LABPT, INR in the last 72 hours.  Exam:  Alert and oriented. NAD.  Wound looks good.  No drainage or signs of infection.  NVI.    Assessment/Plan: D/c home today.  Scripts on chart for norco and robaxin.  F/u in office as scheduled.  All questions answered.    Benjiman Core 03/02/2016, 8:16 AM

## 2016-03-02 NOTE — Discharge Summary (Signed)
Patient ID: CASLYN GUENTHNER MRN: [id number] DOB/AGE: [birth year] 63 y.o.  Admit date: 03/01/2016 Discharge date: 03/02/2016  Admission Diagnoses:  Active Problems:   Neurogenic claudication due to lumbar spinal stenosis   Discharge Diagnoses:  Active Problems:   Neurogenic claudication due to lumbar spinal stenosis  status post Procedure(s): LEFT AND CENTRAL L3-4 AND LEFT L4-5 DECOMPRESSIVE LUMBAR LAMINECTOMY FOR SPINAL STENOSIS  Past Medical History:  Diagnosis Date  . Adenomatous polyps 2004    Colonoscopy done in [facility]  . Arthritis   . Back pain   . Complication of anesthesia   . COPD (chronic obstructive pulmonary disease) ([place])   . Dyspnea    pain contributes but also pt. reveals that she has been out of her Spiriva for one month +  . GERD (gastroesophageal reflux disease)   . Hemorrhoids   . Hyperlipidemia   . Hypertension   . Osteoarthritis   . PONV (postoperative nausea and vomiting)    scopolamine patch used with success with a prev. surg.     Surgeries: Procedure(s): LEFT AND CENTRAL L3-4 AND LEFT L4-5 DECOMPRESSIVE LUMBAR LAMINECTOMY FOR SPINAL STENOSIS on 03/01/2016   Consultants:   Discharged Condition: Improved  Hospital Course: KALESHIA MUELLER is an 65 y.o. female who was admitted 03/01/2016 for operative treatment of lumbar stenosis. Patient failed conservative treatments (please see the history and physical for the specifics) and had severe unremitting pain that affects sleep, daily activities and work/hobbies. After pre-op clearance, the patient was taken to the operating room on 03/01/2016 and underwent  Procedure(s): LEFT AND CENTRAL L3-4 AND LEFT L4-5 DECOMPRESSIVE LUMBAR LAMINECTOMY FOR SPINAL STENOSIS.    Patient was given perioperative antibiotics: Anti-infectives    Start     Dose/Rate Route Frequency Ordered Stop   03/01/16 1500  ceFAZolin (ANCEF) IVPB 1 g/50 mL premix     1 g 100 mL/hr over 30 Minutes Intravenous Every  8 hours 03/01/16 1153 03/01/16 2245   03/01/16 0550  ceFAZolin (ANCEF) IVPB 2g/100 mL premix     2 g 200 mL/hr over 30 Minutes Intravenous On call to O.R. 03/01/16 0550 03/01/16 0757       Patient was given sequential compression devices and early ambulation to prevent DVT.   Patient benefited maximally from hospital stay and there were no complications. At the time of discharge, the patient was urinating/moving their bowels without difficulty, tolerating a regular diet, pain is controlled with oral pain medications and they have been cleared by PT/OT.   Recent vital signs: Patient Vitals for the past 24 hrs:  BP Temp Temp src Pulse Resp SpO2  03/02/16 0355 125/84 98.2 F (36.8 C) Oral 94 20 93 %  03/01/16 2337 112/63 97.9 F (36.6 C) Oral (!) 101 20 94 %  03/01/16 2001 124/79 98.4 F (36.9 C) Oral (!) 55 20 94 %  03/01/16 1550 (!) 176/59 97.5 F (36.4 C) - (!) 56 20 96 %  03/01/16 1158 (!) 139/94 97.8 F (36.6 C) - (!) 53 18 95 %  03/01/16 1130 (!) 141/63 98.2 F (36.8 C) - (!) 102 17 96 %  03/01/16 1059 - - - (!) 50 20 98 %  03/01/16 1045 - - - (!) 50 19 95 %  03/01/16 1040 - 97.6 F (36.4 C) - - - -     Recent laboratory studies:  Recent Labs  03/02/16 0158  WBC 20.9*  HGB 14.0  HCT 42.5  PLT 203  NA 137  K  4.3  CL 102  CO2 28  BUN 14  CREATININE 0.86  GLUCOSE 143*  CALCIUM 9.5     Discharge Medications:   Allergies as of 03/02/2016   No Known Allergies     Medication List    TAKE these medications   albuterol 108 (90 Base) MCG/ACT inhaler Commonly known as:  PROAIR HFA Inhale 2 puffs into the lungs every 6 (six) hours as needed for wheezing or shortness of breath.   cyclobenzaprine 5 MG tablet Commonly known as:  FLEXERIL Take 5 mg by mouth 3 (three) times daily as needed for muscle spasms.   fish oil-omega-3 fatty acids 1000 MG capsule Take 1 g by mouth daily.   furosemide 40 MG tablet Commonly known as:  LASIX TAKE 2 TABLET TWICE A DAY AS  NEEDED LEG SWELLING   HYDROcodone-acetaminophen 7.5-325 MG tablet Commonly known as:  NORCO Take 1-2 tablets by mouth every 4 (four) hours as needed for moderate pain. What changed:  See the new instructions.   lisinopril-hydrochlorothiazide 20-12.5 MG tablet Commonly known as:  PRINZIDE,ZESTORETIC Take 1 tablet by mouth daily.   methocarbamol 500 MG tablet Commonly known as:  ROBAXIN Take 1 tablet (500 mg total) by mouth every 8 (eight) hours as needed for muscle spasms.   multivitamin capsule Take 1 capsule by mouth daily.   piroxicam 10 MG capsule Commonly known as:  FELDENE Take 1 capsule (10 mg total) by mouth 2 (two) times daily as needed. TAKE 1 CAPSULE TWICE A DAY. What changed:  when to take this  additional instructions   rosuvastatin 40 MG tablet Commonly known as:  CRESTOR Take 1 tablet (40 mg total) by mouth daily.   SPIRIVA RESPIMAT 2.5 MCG/ACT Aers Generic drug:  Tiotropium Bromide Monohydrate 1 PUFF EVERY DAY            Durable Medical Equipment        Start     Ordered   03/01/16 1154  DME Walker rolling  Once    Question:  Patient needs a walker to treat with the following condition  Answer:  Neurogenic claudication due to lumbar spinal stenosis   03/01/16 1153   03/01/16 1154  DME 3 n 1  Once     03/01/16 1153      Diagnostic Studies: Dg Lumbar Spine Complete  Result Date: 03/01/2016 CLINICAL DATA:  L3-4 and L4-5 laminectomy decompression EXAM: LUMBAR SPINE - MULTIPLE LATERAL VIEWS COMPARISON:  Lumbar MRI March 12, 2015 FINDINGS: On cross-table lateral lumbar image labeled #1, metallic probe tips are posterior to the midportion of the S1 vertebral body in the inferior aspect of the L2 vertebral body. There is disc space narrowing at all levels with vacuum phenomenon at L5-S1. On cross-table lateral image labeled #2, metallic probe tips are posterior to the midportion of the L3 vertebral body and posterior to the L3-4 interspace  respectively. No fracture or spondylolisthesis. There is multilevel arthropathy. On cross-table lateral image labeled #3, metallic probe tips are posterior to the midportion of the L3 vertebral body in the inferior aspect of the L4 vertebral body. No fracture or spondylolisthesis. On cross-table lateral lumbar image labeled #4, metallic probe tip is posterior to the L3-4 interspace level. Cutting tool overlies the L4 spinous process. No fracture or spondylolisthesis. There is vacuum phenomenon at L5-S1. There are foci of atherosclerotic calcification in the aorta. IMPRESSION: On the final submitted image, metallic probe tip is posterior to the L3-4 interspace. Cutting tool overlies the L4  spinous process. No fracture or spondylolisthesis. Vacuum phenomenon noted at L5-S1. Aortic atherosclerosis noted. Electronically Signed   By: Lowella Grip III M.D.   On: 03/01/2016 09:15      Follow-up Information    Jessy Oto, MD In 2 weeks.   Specialty:  Orthopedic Surgery Why:  For wound re-check Contact information: [place] [place] [address] [phone]           Discharge Plan:  discharge to home      Signed: Benjiman Core for Basil Dess MD [facility] orthoped[facility] 03/02/2016, 8:19 AM

## 2016-03-02 NOTE — Progress Notes (Signed)
Pt doing well. Pt given D/C instructions with Rx's, verbal understanding was provided. Pt's incision is clean and dry with no sign of infection. Pt's IV was removed prior to D/C. Pt D/C'd home via wheelchair @ 1115 per MD order. Pt is stable @ D/C and has no other needs at this time. Holli Humbles, RN

## 2016-03-02 NOTE — Evaluation (Signed)
Occupational Therapy Evaluation Patient Details Name: Kelly Roth MRN: [id number] DOB: Aug 19, 1950 Today's Date: 03/02/2016    History of Present Illness Patient is a 65 y/o female with hx of COPD, HTN, HLD presents s/p left and central L3-4 and L4-5 DECOMPRESSIVE LUMBAR LAMINECTOMY FOR SPINAL STENOSIS    Clinical Impression   Patient evaluated by Occupational Therapy with no further acute OT needs identified. All education has been completed and the patient has no further questions. See below for any follow-up Occupational Therapy or equipment needs. OT to sign off. Thank you for referral.      Follow Up Recommendations  No OT follow up    Equipment Recommendations  None recommended by OT    Recommendations for Other Services       Precautions / Restrictions Precautions Precautions: Back;Fall Precaution Comments: handout provided adn reviewed for adls. pt able to recall 3 out 3 Restrictions Weight Bearing Restrictions: No      Mobility Bed Mobility               General bed mobility comments: in chair on arival  Transfers Overall transfer level: Needs assistance Equipment used: Rolling walker (2 wheeled) Transfers: Sit to/from Stand Sit to Stand: Supervision         General transfer comment: cues for RW placement    Balance Overall balance assessment: Needs assistance Sitting-balance support: Feet supported;No upper extremity supported Sitting balance-Leahy Scale: Fair     Standing balance support: Single extremity supported;During functional activity Standing balance-Leahy Scale: Fair                              ADL Overall ADL's : Needs assistance/impaired Eating/Feeding: Independent   Grooming: Wash/dry hands;Wash/dry face;Oral care;Supervision/safety   Upper Body Bathing: Modified independent   Lower Body Bathing: Modified independent;Sit to/from stand;Cueing for back precautions;With adaptive equipment Lower Body  Bathing Details (indicate cue type and reason): educated and demonstrates AE Upper Body Dressing : Modified independent Upper Body Dressing Details (indicate cue type and reason): don shirt for home Lower Body Dressing: Modified independent;With adaptive equipment;Cueing for back precautions Lower Body Dressing Details (indicate cue type and reason): educated on AE and plans to purchase AE for home from gift shop Toilet Transfer: Supervision/safety Toilet Transfer Details (indicate cue type and reason): requires grab bar   Toileting - Clothing Manipulation Details (indicate cue type and reason): educated on toilet aid and plans to buy. pt very excited for education. Pt unable to wipe iwthout bendign     Functional mobility during ADLs: Supervision/safety General ADL Comments: cues for safety to use RW> pt starts walking without RW due to it being on the opposite side of the bed. Pt educated that PT has recommended use of RW and that ambulation requires RW use   Back handout provided and reviewed adls in detail. Pt educated on: clothing between brace, never sleep in brace, set an alarm at night for medication, avoid sitting for long periods of time, correct bed positioning for sleeping, correct sequence for bed mobility, avoiding lifting more than 5 pounds and never wash directly over incision. All education is complete and patient indicates understanding.     Vision     Perception     Praxis      Pertinent Vitals/Pain Pain Assessment: Faces Pain Score: 6  Faces Pain Scale: Hurts little more Pain Location: Incision site Pain Descriptors / Indicators: Sore;Operative site guarding Pain Intervention(s): Limited  activity within patient's tolerance;Monitored during session;Premedicated before session;Repositioned     Hand Dominance Right   Extremity/Trunk Assessment Upper Extremity Assessment Upper Extremity Assessment: Overall WFL for tasks assessed   Lower Extremity  Assessment Lower Extremity Assessment: Overall WFL for tasks assessed   Cervical / Trunk Assessment Cervical / Trunk Assessment: Other exceptions (s/p surg with rounded shoulders)   Communication Communication Communication: No difficulties   Cognition Arousal/Alertness: Awake/alert Behavior During Therapy: WFL for tasks assessed/performed Overall Cognitive Status: Within Functional Limits for tasks assessed                     General Comments       Exercises       Shoulder Instructions      Home Living Family/patient expects to be discharged to:: Private residence Living Arrangements: Spouse/significant other Available Help at Discharge: Family Type of Home: House Home Access: Stairs to enter Technical brewer of Steps: 4-5 Entrance Stairs-Rails: Right Home Layout: One level     Bathroom Shower/Tub: Tub/shower unit;Other (comment) (rebath )   Bathroom Toilet: Handicapped height     Home Equipment: Walker - 2 wheels;Walker - 4 wheels;Cane - single point          Prior Functioning/Environment Level of Independence: Independent        Comments: Goes to visit her mother in [place] for months at a time.        OT Problem List:     OT Treatment/Interventions:      OT Goals(Current goals can be found in the care plan section) Acute Rehab OT Goals Patient Stated Goal: none stated just wants to go home today  OT Frequency:     Barriers to D/C:            Co-evaluation              End of Session Equipment Utilized During Treatment: Gait belt;Rolling walker Nurse Communication: Mobility status;Precautions  Activity Tolerance: Patient tolerated treatment well Patient left: in chair;with call bell/phone within reach   Time: 0924-0949 OT Time Calculation (min): 25 min Charges:  OT General Charges $OT Visit: 1 Procedure OT Evaluation $OT Eval Moderate Complexity: 1 Procedure OT Treatments $Self Care/Home Management : 8-22  mins G-Codes: OT G-codes **NOT FOR INPATIENT CLASS** Functional Assessment Tool Used: clincial judgement Functional Limitation: Self care Self Care Current Status [id number]): At least 20 percent but less than 40 percent impaired, limited or restricted Self Care Discharge Status [phone]): At least 1 percent but less than 20 percent impaired, limited or restricted  Peri Maris 03/02/2016, 12:11 PM   Jeri Modena   OTR/L Pager: [phone] Office: [phone] .

## 2016-03-05 DIAGNOSIS — E785 Hyperlipidemia, unspecified: Secondary | ICD-10-CM | POA: Diagnosis not present

## 2016-03-05 DIAGNOSIS — J449 Chronic obstructive pulmonary disease, unspecified: Secondary | ICD-10-CM | POA: Diagnosis not present

## 2016-03-05 DIAGNOSIS — Z6841 Body Mass Index (BMI) 40.0 and over, adult: Secondary | ICD-10-CM | POA: Diagnosis not present

## 2016-03-05 DIAGNOSIS — I10 Essential (primary) hypertension: Secondary | ICD-10-CM | POA: Diagnosis not present

## 2016-03-05 DIAGNOSIS — Z0000 Encounter for general adult medical examination without abnormal findings: Secondary | ICD-10-CM | POA: Diagnosis not present

## 2016-03-09 ENCOUNTER — Telehealth (INDEPENDENT_AMBULATORY_CARE_PROVIDER_SITE_OTHER): Payer: Self-pay | Admitting: Specialist

## 2016-03-09 NOTE — Telephone Encounter (Signed)
Pt needs appt with nitka for wound recheck post surgery next week or the following. Can you open something up?   Also, because she is going to a pain clinic, the pharmacy wouldn't refill rx nitka wrote for her.   Pt  number is [phone]

## 2016-03-16 NOTE — Telephone Encounter (Signed)
Pt called again needs to come in before 03/30/16 due to going out of town for return post surgery, wants to know if her primary dr. Could take care of this if no one will work her in. [phone]

## 2016-03-16 NOTE — Telephone Encounter (Signed)
Patient scheduled for 03/18/16 @ 10am with Dr. Louanne Skye

## 2016-03-18 ENCOUNTER — Ambulatory Visit (INDEPENDENT_AMBULATORY_CARE_PROVIDER_SITE_OTHER): Payer: Medicare HMO | Admitting: Specialist

## 2016-03-18 ENCOUNTER — Encounter (INDEPENDENT_AMBULATORY_CARE_PROVIDER_SITE_OTHER): Payer: Self-pay | Admitting: Specialist

## 2016-03-18 DIAGNOSIS — Z9889 Other specified postprocedural states: Secondary | ICD-10-CM

## 2016-03-18 MED ORDER — HYDROCODONE-ACETAMINOPHEN 5-325 MG PO TABS: 1.0000 | ORAL_TABLET | Freq: Four times a day (QID) | ORAL | 0 refills | Status: DC | PRN

## 2016-03-18 NOTE — Patient Instructions (Addendum)
Avoid frequent bending and stooping  No lifting greater than 10 lbs. May use ice or moist heat for pain. Weight loss is of benefit. Walk for exercise and to build endurance.  She plans to take her mother back to [place] in the next 3-4 weeks so she will not be back to [place] till 6 weeks from now, will see her then in 8 weeks.

## 2016-03-18 NOTE — Progress Notes (Addendum)
Post-Op Visit Note   Patient: Kelly Roth           Date of Birth: 02-14-1951           MRN: [id number] Visit Date: 03/18/2016 PCP: Beatrice Lecher, MD   Assessment & Plan:3 weeks post multiple level laminectomy doing well.  Chief Complaint:  Ms. Kelly Roth is here for follow up on her surgery, she had Left and central L3-4 & Left L4-5 decompressiive lumbar laminectomy for spinal stenosis.  She states that she is doing great oveall. She is walking better and able to stand up straight now.  She is having some slight discomfort in her left hip but thinks it has to do with the cold weather.  She states that when she gets up in morning the both of her anterior thighs are tight but as she gets up and moves they loosen up. Incision is healing well, legs are N-V normal, does notice some anterior thigh tightness first thing in the AM but is able to stand upright better. Visit Diagnoses: No diagnosis found.  Plan: Avoid frequent bending and stooping  No lifting greater than 10 lbs. May use ice or moist heat for pain. Weight loss is of benefit. Walk for exercise and to build endurance.  She plans to take her mother back to [place] in the next 3-4 weeks so she will not be back to [place] till 6 weeks from now, will see her then in 8 weeks.     Follow-Up Instructions: No Follow-up on file.   Orders:  No orders of the defined types were placed in this encounter.  No orders of the defined types were placed in this encounter.    PMFS History: Patient Active Problem List   Diagnosis Date Noted  . Neurogenic claudication due to lumbar spinal stenosis 03/01/2016  . Stenosis, spinal, lumbar 09/25/2012  . Onychomycosis 06/16/2011  . Insulin resistance 05/17/2011  . Metabolic syndrome [id number]  . Morbid obesity ([place]) 04/26/2011  . Nonspecific (abnormal) findings on radiological and other examination of gastrointestinal tract 06/02/2010  . Unspecified constipation 06/02/2010  .  Personal history of colonic polyps 06/02/2010  . MOOD DISORDER 05/11/2010  . HYPERTENSION, BENIGN 05/11/2010  . SNORING 05/11/2010  . NOCTURIA 05/11/2010  . RENAL CYST, RIGHT 01/03/2008  . SIGMOID POLYP 04/27/2007  . HEMORRHOIDS, INTERNAL 04/27/2007  . GERD 08/25/2006  . Hyperlipidemia 05/23/2006   Past Medical History:  Diagnosis Date  . Adenomatous polyps 2004    Col[facility] done in [facility]  . Arthritis   . Back pain   . Complication of anesthesia   . COPD (chronic obstructive pulmonary disease) ([place])   . Dyspnea    pain contributes but also pt. reveals that she has been out of her Spiriva for one month +  . GERD (gastroesophageal reflux disease)   . Hemorrhoids   . Hyperlipidemia   . Hypertension   . Osteoarthritis   . PONV (postoperative nausea and vomiting)    scopolamine patch used with success with a prev. surg.     Family History  Problem Relation Age of Onset  . Depression Mother   . Hypertension Mother   . Hyperlipidemia Mother   . Cancer Mother     lung  . COPD Mother   . Hyperlipidemia Father   . Hypertension Father   . Colon cancer Neg Hx     Neg     Past Surgical History:  Procedure Laterality Date  . CHOLECYSTECTOMY  05-08-07  .  JOINT REPLACEMENT Bilateral   . left knee replacement  3-07   right on 2-06  . LUMBAR LAMINECTOMY/DECOMPRESSION MICRODISCECTOMY N/A 03/01/2016   Procedure: LEFT AND CENTRAL L3-4 AND LEFT L4-5 DECOMPRESSIVE LUMBAR LAMINECTOMY FOR SPINAL STENOSIS;  Surgeon: Jessy Oto, MD;  Location: [facility];  Service: Orthopedics;  Laterality: N/A;  . MULTIPLE TOOTH EXTRACTIONS     Social History   Occupational History  . Not on file.   Social History Main Topics  . Smoking status: Former Smoker    Packs/day: 1.00    Quit date: 03/15/2015  . Smokeless tobacco: Never Used     Comment: very rare use, she remarks "I had one cigarette today, reports thia is the first one in a month"  . Alcohol use 0.0 oz/week     Comment: one drink  a week   . Drug use: No  . Sexual activity: Not on file

## 2016-03-22 ENCOUNTER — Ambulatory Visit (INDEPENDENT_AMBULATORY_CARE_PROVIDER_SITE_OTHER): Payer: Medicare HMO | Admitting: Family Medicine

## 2016-03-22 ENCOUNTER — Encounter: Payer: Self-pay | Admitting: Family Medicine

## 2016-03-22 VITALS — BP 148/78 | HR 100 | Ht 64.0 in | Wt 262.0 lb

## 2016-03-22 DIAGNOSIS — R7301 Impaired fasting glucose: Secondary | ICD-10-CM | POA: Diagnosis not present

## 2016-03-22 DIAGNOSIS — I10 Essential (primary) hypertension: Secondary | ICD-10-CM | POA: Diagnosis not present

## 2016-03-22 DIAGNOSIS — E8881 Metabolic syndrome: Secondary | ICD-10-CM | POA: Diagnosis not present

## 2016-03-22 DIAGNOSIS — J410 Simple chronic bronchitis: Secondary | ICD-10-CM | POA: Diagnosis not present

## 2016-03-22 LAB — POCT GLYCOSYLATED HEMOGLOBIN (HGB A1C): Hemoglobin A1C: 5.7

## 2016-03-22 MED ORDER — TIOTROPIUM BROMIDE MONOHYDRATE 2.5 MCG/ACT IN AERS: INHALATION_SPRAY | RESPIRATORY_TRACT | 3 refills | Status: DC

## 2016-03-22 MED ORDER — PIROXICAM 10 MG PO CAPS: 10.0000 mg | ORAL_CAPSULE | Freq: Two times a day (BID) | ORAL | 1 refills | Status: DC

## 2016-03-22 MED ORDER — LISINOPRIL-HYDROCHLOROTHIAZIDE 20-12.5 MG PO TABS: 1.0000 | ORAL_TABLET | Freq: Every day | ORAL | 1 refills | Status: DC

## 2016-03-22 NOTE — Progress Notes (Signed)
Subjective:    CC: HTN, IFG  HPI: Hypertension- Pt denies chest pain, SOB, dizziness, or heart palpitations.  Taking meds as directed w/o problems.  Denies medication side effects.    IFG - She says she's really tried hard to cut out sweets. No increased thirst or urination.  COPD-she ran out of Spiriva several weeks ago and would like a refill. She has had these are mural couple of times since she ran out of Spiriva.  Past medical history, Surgical history, Family history not pertinant except as noted below, Social history, Allergies, and medications have been entered into the medical record, reviewed, and corrections made.   Review of Systems: No fevers, chills, night sweats, weight loss, chest pain, or shortness of breath.   Objective:    General: Well Developed, well nourished, and in no acute distress.  Neuro: Alert and oriented x3, extra-ocular muscles intact, sensation grossly intact.  HEENT: Normocephalic, atraumatic  Skin: Warm and dry, no rashes. Cardiac: Regular rate and rhythm, no murmurs rubs or gallops, no lower extremity edema.  Respiratory: Clear to auscultation bilaterally. Not using accessory muscles, speaking in full sentences.   Impression and Recommendations:    HTN - Uncontrolled today but she says she forgot to take her blood pressure pill. Look fantastic 6 months ago. Just make sure getting back on track.  IFG - 11 A1c of 5.7 today which is stable from July. Continue current regimen.   COPD-will refill Spiriva. We really need to schedule her for spirometry so plan to do so and 3-4 months.

## 2016-03-28 ENCOUNTER — Telehealth: Payer: Self-pay | Admitting: Family Medicine

## 2016-03-28 MED ORDER — ALBUTEROL SULFATE HFA 108 (90 BASE) MCG/ACT IN AERS: 2.0000 | INHALATION_SPRAY | Freq: Four times a day (QID) | RESPIRATORY_TRACT | 5 refills | Status: DC | PRN

## 2016-03-28 NOTE — Telephone Encounter (Signed)
Pt advised of Proair change. She will contact insurance to see what would be a better change for the Spiriva.

## 2016-03-28 NOTE — Telephone Encounter (Signed)
Pt called clinic to advise her insurance will not pay for Spiriva or Proair. Request Rx's for Advair and ventolin, which are covered. Will route.

## 2016-03-28 NOTE — Telephone Encounter (Signed)
We can definitely change the Proair to Ventolin which is no problem. But unfortunately Spiriva and Advair are completely different medications. She may need to call her insurance back and find out what will be covered in place of Spiriva.  Beatrice Lecher, MD

## 2016-03-29 MED ORDER — UMECLIDINIUM BROMIDE 62.5 MCG/INH IN AEPB: 1.0000 | INHALATION_SPRAY | Freq: Every day | RESPIRATORY_TRACT | 11 refills | Status: DC

## 2016-03-29 NOTE — Telephone Encounter (Signed)
Left detailed VM for Pt advising of Rx change. Callback provided for any questions.

## 2016-03-29 NOTE — Addendum Note (Signed)
Addended by: Beatrice Lecher D on: 03/29/2016 07:48 AM   Modules accepted: Orders

## 2016-03-29 NOTE — Telephone Encounter (Addendum)
Received notification from pharmacy. They will cover Advair, Incruse, Combivent, Serevent, Atrovent, Bevespi.  I will send over Rx for Incruse.

## 2016-03-30 ENCOUNTER — Ambulatory Visit: Payer: Medicare HMO | Admitting: Family Medicine

## 2016-04-15 ENCOUNTER — Telehealth (INDEPENDENT_AMBULATORY_CARE_PROVIDER_SITE_OTHER): Payer: Self-pay | Admitting: Specialist

## 2016-04-15 NOTE — Telephone Encounter (Signed)
Patient called and wanted to know if Dr. Louanne Skye would refill (call in) her prescription for her methocarbamol (500mg ).  She is in [place] with her mother.  She stated that if he calls it in, her husband could pick it up and mail it to her.  [phone].  Thank you.

## 2016-04-18 NOTE — Telephone Encounter (Signed)
Patient called and wanted to know if Dr. Louanne Skye would refill (call in) her prescription for her methocarbamol (500mg ).

## 2016-04-19 ENCOUNTER — Other Ambulatory Visit (INDEPENDENT_AMBULATORY_CARE_PROVIDER_SITE_OTHER): Payer: Self-pay | Admitting: Specialist

## 2016-04-19 MED ORDER — METHOCARBAMOL 500 MG PO TABS: 500.0000 mg | ORAL_TABLET | Freq: Three times a day (TID) | ORAL | 1 refills | Status: DC | PRN

## 2016-04-19 NOTE — Telephone Encounter (Signed)
Rx for methocarbamol sent to her pharmacy, [place]. jen

## 2016-04-20 NOTE — Telephone Encounter (Signed)
I called and advised her rx was called to her pharmacy last night

## 2016-04-20 NOTE — Progress Notes (Signed)
I called and advised her rx was called to her pharmacy last night

## 2016-05-18 DIAGNOSIS — Z79891 Long term (current) use of opiate analgesic: Secondary | ICD-10-CM | POA: Diagnosis not present

## 2016-05-18 DIAGNOSIS — M47816 Spondylosis without myelopathy or radiculopathy, lumbar region: Secondary | ICD-10-CM | POA: Diagnosis not present

## 2016-05-18 DIAGNOSIS — M5416 Radiculopathy, lumbar region: Secondary | ICD-10-CM | POA: Diagnosis not present

## 2016-05-18 DIAGNOSIS — G8929 Other chronic pain: Secondary | ICD-10-CM | POA: Diagnosis not present

## 2016-05-19 ENCOUNTER — Ambulatory Visit (INDEPENDENT_AMBULATORY_CARE_PROVIDER_SITE_OTHER): Payer: Medicare HMO | Admitting: Specialist

## 2016-05-19 ENCOUNTER — Encounter (INDEPENDENT_AMBULATORY_CARE_PROVIDER_SITE_OTHER): Payer: Self-pay | Admitting: Surgery

## 2016-05-19 ENCOUNTER — Ambulatory Visit (INDEPENDENT_AMBULATORY_CARE_PROVIDER_SITE_OTHER): Payer: Medicare HMO | Admitting: Surgery

## 2016-05-19 VITALS — BP 139/88 | HR 107 | Ht 63.0 in | Wt 265.0 lb

## 2016-05-19 DIAGNOSIS — Z9889 Other specified postprocedural states: Secondary | ICD-10-CM

## 2016-05-19 MED ORDER — METHOCARBAMOL 500 MG PO TABS: 500.0000 mg | ORAL_TABLET | Freq: Three times a day (TID) | ORAL | 0 refills | Status: DC | PRN

## 2016-05-19 NOTE — Progress Notes (Signed)
Post-Op Visit Note   Patient: Kelly Roth           Date of Birth: [birth year]           MRN: [id number] Visit Date: 05/19/2016 PCP: Beatrice Lecher, MD   Assessment & Plan:  Chief Complaint:  Chief Complaint  Patient presents with  . Lower Back - Routine Post Op   Visit Diagnoses:  1. History of lumbar laminectomy for spinal cord decompression     Plan: Patient doing very well. Follow-up in 4 months for recheck. Returns sooner if needed.  Follow-Up Instructions: Return in about 4 months (around 09/18/2016).   Orders:  No orders of the defined types were placed in this encounter.  Meds ordered this encounter  Medications  . methocarbamol (ROBAXIN) 500 MG tablet    Sig: Take 1 tablet (500 mg total) by mouth every 8 (eight) hours as needed for muscle spasms.    Dispense:  60 tablet    Refill:  0   HPI Patient returns for follow up. She is status post left and central L3-4 and left L4-5 decompressive lumbar laminectomy on 03/01/2016.  She is 11 weeks and 2 days post op. She states that she is doing well and walked here. She does not have any back pain, but continues to have pain in her thighs, which is worse on the right. She noticed when she was in [place], she could take the muscle relaxers and get around pretty well. She thinks the pain in her thighs may have something to do with the colder weather here. She continues to try and walk a lot and is pushing a cart on many occasions in the grocery store. She is requesting a refill on methocarbamol today.   Imaging: No results found.  PMFS History: Patient Active Problem List   Diagnosis Date Noted  . IFG (impaired fasting glucose) 03/22/2016  . Neurogenic claudication due to lumbar spinal stenosis 03/01/2016  . Stenosis, spinal, lumbar 09/25/2012  . Onychomycosis 06/16/2011  . Insulin resistance 05/17/2011  . Metabolic syndrome 03/54/6568  . Morbid obesity ([place]) 04/26/2011  . Nonspecific (abnormal) findings  on radiological and other examination of gastrointestinal tract 06/02/2010  . Unspecified constipation 06/02/2010  . Personal history of colonic polyps 06/02/2010  . MOOD DISORDER 05/11/2010  . HYPERTENSION, BENIGN 05/11/2010  . SNORING 05/11/2010  . NOCTURIA 05/11/2010  . RENAL CYST, RIGHT 01/03/2008  . SIGMOID POLYP 04/27/2007  . HEMORRHOIDS, INTERNAL 04/27/2007  . GERD 08/25/2006  . Hyperlipidemia 05/23/2006   Past Medical History:  Diagnosis Date  . Adenomatous polyps 2004    Colonoscopy done in [facility]  . Arthritis   . Back pain   . Complication of anesthesia   . COPD (chronic obstructive pulmonary disease) ([place])   . Dyspnea    pain contributes but also pt. reveals that she has been out of her Spiriva for one month +  . GERD (gastroesophageal reflux disease)   . Hemorrhoids   . Hyperlipidemia   . Hypertension   . Osteoarthritis   . PONV (postoperative nausea and vomiting)    scopolamine patch used with success with a prev. surg.     Family History  Problem Relation Age of Onset  . Depression Mother   . Hypertension Mother   . Hyperlipidemia Mother   . Cancer Mother     lung  . COPD Mother   . Hyperlipidemia Father   . Hypertension Father   . Colon cancer Neg Hx  Neg     Past Surgical History:  Procedure Laterality Date  . CHOLECYSTECTOMY  05-08-07  . JOINT REPLACEMENT Bilateral   . left knee replacement  3-07   right on 2-06  . LUMBAR LAMINECTOMY/DECOMPRESSION MICRODISCECTOMY N/A 03/01/2016   Procedure: LEFT AND CENTRAL L3-4 AND LEFT L4-5 DECOMPRESSIVE LUMBAR LAMINECTOMY FOR SPINAL STENOSIS;  Surgeon: Jessy Oto, MD;  Location: [facility];  Service: Orthopedics;  Laterality: N/A;  . MULTIPLE TOOTH EXTRACTIONS     Social History   Occupational History  . Not on file.   Social History Main Topics  . Smoking status: Former Smoker    Packs/day: 1.00    Quit date: 03/15/2015  . Smokeless tobacco: Never Used     Comment: very rare use, she remarks "I  had one cigarette today, reports thia is the first one in a month"  . Alcohol use 0.0 oz/week     Comment: one drink a week   . Drug use: No  . Sexual activity: Not on file   Exam Pleasant female alert and oriented in no acute distress. She relates well. He has mild-to-moderate tenderness over the right hip greater trochanter bursa. Negative logroll. Negative straight leg raise. No focal motor deficits.

## 2016-06-20 ENCOUNTER — Other Ambulatory Visit (INDEPENDENT_AMBULATORY_CARE_PROVIDER_SITE_OTHER): Payer: Self-pay | Admitting: Surgery

## 2016-06-20 NOTE — Telephone Encounter (Signed)
This is a Dr. Louanne Skye patient.

## 2016-06-20 NOTE — Telephone Encounter (Signed)
Robaxin refill request 

## 2016-06-21 ENCOUNTER — Other Ambulatory Visit (INDEPENDENT_AMBULATORY_CARE_PROVIDER_SITE_OTHER): Payer: Self-pay | Admitting: Surgery

## 2016-06-22 NOTE — Telephone Encounter (Signed)
Robaxin refill request 

## 2016-06-23 ENCOUNTER — Other Ambulatory Visit (INDEPENDENT_AMBULATORY_CARE_PROVIDER_SITE_OTHER): Payer: Self-pay | Admitting: Surgery

## 2016-06-25 ENCOUNTER — Other Ambulatory Visit (INDEPENDENT_AMBULATORY_CARE_PROVIDER_SITE_OTHER): Payer: Self-pay | Admitting: Surgery

## 2016-07-13 DIAGNOSIS — M47816 Spondylosis without myelopathy or radiculopathy, lumbar region: Secondary | ICD-10-CM | POA: Diagnosis not present

## 2016-07-13 DIAGNOSIS — G8929 Other chronic pain: Secondary | ICD-10-CM | POA: Diagnosis not present

## 2016-07-22 ENCOUNTER — Other Ambulatory Visit: Payer: Medicare HMO

## 2016-08-10 ENCOUNTER — Telehealth (INDEPENDENT_AMBULATORY_CARE_PROVIDER_SITE_OTHER): Payer: Self-pay | Admitting: Specialist

## 2016-08-10 NOTE — Telephone Encounter (Signed)
FYI Returned call to patient's husband and he advised Kelly Roth will be out of town until [employer] He will have her call to reschedule her appointment with Dr Louanne Skye. [phone]

## 2016-08-22 ENCOUNTER — Ambulatory Visit (INDEPENDENT_AMBULATORY_CARE_PROVIDER_SITE_OTHER): Payer: Medicare HMO | Admitting: Family Medicine

## 2016-08-22 VITALS — BP 141/80 | HR 133 | Ht 63.0 in

## 2016-08-22 DIAGNOSIS — R000 Tachycardia, unspecified: Secondary | ICD-10-CM | POA: Diagnosis not present

## 2016-08-22 LAB — CBC WITH DIFFERENTIAL/PLATELET
BASOS PCT: 0 %
Basophils Absolute: 0 cells/uL (ref 0–200)
Eosinophils Absolute: 238 cells/uL (ref 15–500)
Eosinophils Relative: 2 %
HCT: 45.5 % — ABNORMAL HIGH (ref 35.0–45.0)
HEMOGLOBIN: 15.1 g/dL (ref 11.7–15.5)
LYMPHS ABS: 2856 {cells}/uL (ref 850–3900)
Lymphocytes Relative: 24 %
MCH: 30.9 pg (ref 27.0–33.0)
MCHC: 33.2 g/dL (ref 32.0–36.0)
MCV: 93.2 fL (ref 80.0–100.0)
MONO ABS: 952 {cells}/uL — AB (ref 200–950)
MPV: 10.8 fL (ref 7.5–12.5)
Monocytes Relative: 8 %
NEUTROS PCT: 66 %
Neutro Abs: 7854 cells/uL — ABNORMAL HIGH (ref 1500–7800)
Platelets: 206 10*3/uL (ref 140–400)
RBC: 4.88 MIL/uL (ref 3.80–5.10)
RDW: 13.5 % (ref 11.0–15.0)
WBC: 11.9 10*3/uL — AB (ref 3.8–10.8)

## 2016-08-22 LAB — TROPONIN I: Troponin I: 0.03 ng/mL (ref <=0.05)

## 2016-08-22 LAB — D-DIMER, QUANTITATIVE (NOT AT ARMC): D DIMER QUANT: 0.49 ug{FEU}/mL (ref <0.50)

## 2016-08-22 MED ORDER — NEBIVOLOL HCL 2.5 MG PO TABS: 2.5000 mg | ORAL_TABLET | Freq: Every day | ORAL | 1 refills | Status: DC

## 2016-08-22 NOTE — Progress Notes (Signed)
Subjective:    Patient ID: Kelly Roth, female    DOB: 1950/08/03, 66 y.o.   MRN: [id number]  HPI 66 year old female came in today for scheduled spirometry. When we first started reaming her it was noted that her pulse was elevated at 130 bpm. Previous office visit her pulse was around 100 bpm. She did have albuterol about 45 minutes before she came into the office. She denies any chest pain shortness of breath palpitations. She is completely asymptomatic. She's not feeling fatigued.He is recently cut back on her smoking significantly. In fact she says she is only had 1 cigarette in the last month.   Review of Systems  BP (!) 141/80   Pulse (!) 133   Ht 5\' 3"  (1.6 m)   SpO2 96%     No Known Allergies  Past Medical History:  Diagnosis Date  . Adenomatous polyps 2004    Col[facility]oscopy done in [facility]  . Arthritis   . Back pain   . Complication of anesthesia   . COPD (chronic obstructive pulmonary disease) ([place])   . Dyspnea    pain contributes but also pt. reveals that she has been out of her Spiriva for one month +  . GERD (gastroesophageal reflux disease)   . Hemorrhoids   . Hyperlipidemia   . Hypertension   . Osteoarthritis   . PONV (postoperative nausea and vomiting)    scopolamine patch used with success with a prev. surg.     Past Surgical History:  Procedure Laterality Date  . CHOLECYSTECTOMY  05-08-07  . JOINT REPLACEMENT Bilateral   . left knee replacement  3-07   right on 2-06  . LUMBAR LAMINECTOMY/DECOMPRESSION MICRODISCECTOMY N/A 03/01/2016   Procedure: LEFT AND CENTRAL L3-4 AND LEFT L4-5 DECOMPRESSIVE LUMBAR LAMINECTOMY FOR SPINAL STENOSIS;  Surgeon: Jessy Oto, MD;  Location: [facility];  Service: Orthopedics;  Laterality: N/A;  . MULTIPLE TOOTH EXTRACTIONS      Social History   Social History  . Marital status: Married    Spouse name: N/A  . Number of children: N/A  . Years of education: N/A   Occupational History  . Not on file.    Social History Main Topics  . Smoking status: Former Smoker    Packs/day: 1.00    Quit date: 03/15/2015  . Smokeless tobacco: Never Used     Comment: very rare use, she remarks "I had one cigarette today, reports thia is the first one in a month"  . Alcohol use 0.0 oz/week     Comment: one drink a week   . Drug use: No  . Sexual activity: Not on file   Other Topics Concern  . Not on file   Social History Narrative  . No narrative on file    Family History  Problem Relation Age of Onset  . Depression Mother   . Hypertension Mother   . Hyperlipidemia Mother   . Cancer Mother        lung  . COPD Mother   . Hyperlipidemia Father   . Hypertension Father   . Colon cancer Neg Hx        Neg     Outpatient Encounter Prescriptions as of 08/22/2016  Medication Sig  . albuterol (VENTOLIN HFA) 108 (90 Base) MCG/ACT inhaler Inhale 2 puffs into the lungs every 6 (six) hours as needed for wheezing or shortness of breath.  . cyclobenzaprine (FLEXERIL) 5 MG tablet Take 5 mg by mouth 3 (three) times daily  as needed for muscle spasms.   . fish oil-omega-3 fatty acids 1000 MG capsule Take 1 g by mouth daily.   . furosemide (LASIX) 40 MG tablet TAKE 2 TABLET TWICE A DAY AS NEEDED LEG SWELLING  . HYDROcodone-acetaminophen (NORCO) 7.5-325 MG tablet Take 1-2 tablets by mouth every 4 (four) hours as needed for moderate pain.  Marland Kitchen lisinopril-hydrochlorothiazide (PRINZIDE,ZESTORETIC) 20-12.5 MG tablet Take 1 tablet by mouth daily.  . methocarbamol (ROBAXIN) 500 MG tablet TAKE 1 TABLET (500 MG TOTAL) BY MOUTH EVERY 8 (EIGHT) HOURS AS NEEDED FOR MUSCLE SPASMS.  . Multiple Vitamin (MULTIVITAMIN) capsule Take 1 capsule by mouth daily.    . piroxicam (FELDENE) 10 MG capsule Take 1 capsule (10 mg total) by mouth 2 (two) times daily.  . rosuvastatin (CRESTOR) 40 MG tablet Take 1 tablet (40 mg total) by mouth daily.  Marland Kitchen umeclidinium bromide (INCRUSE ELLIPTA) 62.5 MCG/INH AEPB Inhale 1 puff into the lungs  daily.  . nebivolol (BYSTOLIC) 2.5 MG tablet Take 1 tablet (2.5 mg total) by mouth daily.  . [DISCONTINUED] methocarbamol (ROBAXIN) 500 MG tablet TAKE 1 TABLET (500 MG TOTAL) BY MOUTH EVERY 8 (EIGHT) HOURS AS NEEDED FOR MUSCLE SPASMS.   No facility-administered encounter medications on file as of 08/22/2016.          Objective:   Physical Exam  Constitutional: She is oriented to person, place, and time. She appears well-developed and well-nourished.  HENT:  Head: Normocephalic and atraumatic.  Eyes: Conjunctivae are normal.  Cardiovascular: Regular rhythm and normal heart sounds.   No murmur heard. +tachycardia  Pulmonary/Chest: Effort normal and breath sounds normal.  Musculoskeletal: She exhibits no edema.  Neurological: She is alert and oriented to person, place, and time.  Skin: Skin is warm and dry.  Psychiatric: She has a normal mood and affect. Her behavior is normal.        Assessment & Plan:  EKG shows rate of 130 bpm with an atrial flutter type pattern consistent with atrial flutter with 2-1 AV conduction. She is completely asymptomatic and hemodynamically stable. We'll go ahead and get lab workup today and get her in with cardiology ASAP. This point I do not think that she needs hospitalization.We'll go ahead and start Bystolic as well. Will do close follow-up will call and check on her tomorrow.  Spoke with Dr. Prince Rome. They will try to get her in this week. She did use albuterol about 45 minutes before she came in which certainly could have exacerbated her symptoms.  Opted to reschedule her spirometry for later this summer.

## 2016-08-23 ENCOUNTER — Other Ambulatory Visit (INDEPENDENT_AMBULATORY_CARE_PROVIDER_SITE_OTHER): Payer: Self-pay | Admitting: Specialist

## 2016-08-23 DIAGNOSIS — I4892 Unspecified atrial flutter: Secondary | ICD-10-CM | POA: Diagnosis not present

## 2016-08-23 DIAGNOSIS — I10 Essential (primary) hypertension: Secondary | ICD-10-CM | POA: Diagnosis not present

## 2016-08-23 DIAGNOSIS — I483 Typical atrial flutter: Secondary | ICD-10-CM | POA: Diagnosis not present

## 2016-08-23 DIAGNOSIS — I447 Left bundle-branch block, unspecified: Secondary | ICD-10-CM | POA: Diagnosis not present

## 2016-08-23 LAB — COMPLETE METABOLIC PANEL WITH GFR
ALBUMIN: 4.3 g/dL (ref 3.6–5.1)
ALK PHOS: 95 U/L (ref 33–130)
ALT: 52 U/L — AB (ref 6–29)
AST: 19 U/L (ref 10–35)
BUN: 17 mg/dL (ref 7–25)
CALCIUM: 9.6 mg/dL (ref 8.6–10.4)
CO2: 24 mmol/L (ref 20–31)
CREATININE: 0.69 mg/dL (ref 0.50–0.99)
Chloride: 105 mmol/L (ref 98–110)
GFR, Est African American: >89 mL/min (ref >=60)
GFR, Est Non African American: >89 mL/min (ref >=60)
Glucose, Bld: 132 mg/dL — ABNORMAL HIGH (ref 65–99)
Potassium: 4.1 mmol/L (ref 3.5–5.3)
Sodium: 139 mmol/L (ref 135–146)
TOTAL PROTEIN: 6.6 g/dL (ref 6.1–8.1)
Total Bilirubin: 0.5 mg/dL (ref 0.2–1.2)

## 2016-08-23 LAB — CK: Total CK: 41 U/L (ref 29–143)

## 2016-08-23 LAB — TSH: TSH: 1.73 mIU/L

## 2016-08-23 NOTE — Telephone Encounter (Signed)
Methocarbamol refill request 

## 2016-08-24 DIAGNOSIS — Z1231 Encounter for screening mammogram for malignant neoplasm of breast: Secondary | ICD-10-CM | POA: Diagnosis not present

## 2016-08-24 LAB — HM MAMMOGRAPHY

## 2016-09-01 DIAGNOSIS — R922 Inconclusive mammogram: Secondary | ICD-10-CM | POA: Diagnosis not present

## 2016-09-01 DIAGNOSIS — N6489 Other specified disorders of breast: Secondary | ICD-10-CM | POA: Diagnosis not present

## 2016-09-01 LAB — HM MAMMOGRAPHY

## 2016-09-02 ENCOUNTER — Encounter: Payer: Self-pay | Admitting: Family Medicine

## 2016-09-07 DIAGNOSIS — M47816 Spondylosis without myelopathy or radiculopathy, lumbar region: Secondary | ICD-10-CM | POA: Diagnosis not present

## 2016-09-07 DIAGNOSIS — G8929 Other chronic pain: Secondary | ICD-10-CM | POA: Diagnosis not present

## 2016-09-07 DIAGNOSIS — M5416 Radiculopathy, lumbar region: Secondary | ICD-10-CM | POA: Diagnosis not present

## 2016-09-07 DIAGNOSIS — I878 Other specified disorders of veins: Secondary | ICD-10-CM | POA: Diagnosis not present

## 2016-09-07 DIAGNOSIS — I519 Heart disease, unspecified: Secondary | ICD-10-CM | POA: Diagnosis not present

## 2016-09-07 DIAGNOSIS — I517 Cardiomegaly: Secondary | ICD-10-CM | POA: Diagnosis not present

## 2016-09-11 ENCOUNTER — Other Ambulatory Visit (INDEPENDENT_AMBULATORY_CARE_PROVIDER_SITE_OTHER): Payer: Self-pay | Admitting: Specialist

## 2016-09-12 ENCOUNTER — Other Ambulatory Visit: Payer: Self-pay | Admitting: Family Medicine

## 2016-09-12 NOTE — Telephone Encounter (Signed)
Methocarbamol refill request 

## 2016-09-15 DIAGNOSIS — Z6841 Body Mass Index (BMI) 40.0 and over, adult: Secondary | ICD-10-CM | POA: Diagnosis not present

## 2016-09-15 DIAGNOSIS — R0602 Shortness of breath: Secondary | ICD-10-CM | POA: Diagnosis not present

## 2016-09-15 DIAGNOSIS — R69 Illness, unspecified: Secondary | ICD-10-CM | POA: Diagnosis not present

## 2016-09-15 DIAGNOSIS — Z7902 Long term (current) use of antithrombotics/antiplatelets: Secondary | ICD-10-CM | POA: Diagnosis not present

## 2016-09-15 DIAGNOSIS — I483 Typical atrial flutter: Secondary | ICD-10-CM | POA: Diagnosis not present

## 2016-09-15 DIAGNOSIS — E785 Hyperlipidemia, unspecified: Secondary | ICD-10-CM | POA: Diagnosis not present

## 2016-09-15 DIAGNOSIS — I10 Essential (primary) hypertension: Secondary | ICD-10-CM | POA: Diagnosis not present

## 2016-09-15 DIAGNOSIS — M1990 Unspecified osteoarthritis, unspecified site: Secondary | ICD-10-CM | POA: Diagnosis not present

## 2016-09-15 DIAGNOSIS — J449 Chronic obstructive pulmonary disease, unspecified: Secondary | ICD-10-CM | POA: Diagnosis not present

## 2016-09-15 DIAGNOSIS — I4892 Unspecified atrial flutter: Secondary | ICD-10-CM | POA: Diagnosis not present

## 2016-09-19 ENCOUNTER — Ambulatory Visit (INDEPENDENT_AMBULATORY_CARE_PROVIDER_SITE_OTHER): Payer: Medicare HMO | Admitting: Specialist

## 2016-09-25 ENCOUNTER — Other Ambulatory Visit: Payer: Self-pay | Admitting: Family Medicine

## 2016-09-28 ENCOUNTER — Ambulatory Visit: Payer: Medicare HMO | Admitting: Cardiology

## 2016-11-07 ENCOUNTER — Ambulatory Visit (INDEPENDENT_AMBULATORY_CARE_PROVIDER_SITE_OTHER): Payer: Medicare HMO

## 2016-11-07 ENCOUNTER — Ambulatory Visit (INDEPENDENT_AMBULATORY_CARE_PROVIDER_SITE_OTHER): Payer: Medicare HMO | Admitting: Specialist

## 2016-11-07 ENCOUNTER — Encounter (INDEPENDENT_AMBULATORY_CARE_PROVIDER_SITE_OTHER): Payer: Self-pay | Admitting: Specialist

## 2016-11-07 VITALS — BP 98/52 | HR 59 | Ht 63.0 in | Wt 265.0 lb

## 2016-11-07 DIAGNOSIS — M4726 Other spondylosis with radiculopathy, lumbar region: Secondary | ICD-10-CM

## 2016-11-07 DIAGNOSIS — Z9889 Other specified postprocedural states: Secondary | ICD-10-CM

## 2016-11-07 DIAGNOSIS — I10 Essential (primary) hypertension: Secondary | ICD-10-CM | POA: Diagnosis not present

## 2016-11-07 DIAGNOSIS — I483 Typical atrial flutter: Secondary | ICD-10-CM | POA: Diagnosis not present

## 2016-11-07 DIAGNOSIS — I428 Other cardiomyopathies: Secondary | ICD-10-CM | POA: Diagnosis not present

## 2016-11-07 MED ORDER — METHOCARBAMOL 500 MG PO TABS: 500.0000 mg | ORAL_TABLET | Freq: Three times a day (TID) | ORAL | 1 refills | Status: DC | PRN

## 2016-11-07 NOTE — Patient Instructions (Signed)
Plan: The main ways of treat osteoarthritis, that are found to be success. Weight loss helps to decrease pain. Exercise is important to maintaining cartilage and thickness and strengthening. NSAIDs like motrin, tylenol, alleve are meds decreasing the inflamation. Ice is okay  In afternoon and evening and hot shower in the am Avoid bending, stooping and avoid lifting weights greater than 10 lbs. Avoid prolong standing and walking. Order for a new walker with wheels. Pool walking is a good exercise or the use of a staionary bicycle May wish to consider undergoing faect joint blocks in the lumbar spine to determine if a radiofrequency ablation of the  Lumbar facet would be of benefit.

## 2016-11-07 NOTE — Progress Notes (Signed)
Office Visit Note   Patient: Kelly Roth           Date of Birth: August 14, 1950           MRN: [id number] Visit Date: 11/07/2016              Requested by: Hali Marry, [address] [place], [place] [address] PCP: Hali Marry, MD   Assessment & Plan: Visit Diagnoses:  1. History of lumbar laminectomy for spinal cord decompression   2. Other spondylosis with radiculopathy, lumbar region     Plan: The main ways of treat osteoarthritis, that are found to be success. Weight loss helps to decrease pain. Exercise is important to maintaining cartilage and thickness and strengthening. NSAIDs like motrin, tylenol, alleve are meds decreasing the inflamation. Ice is okay  In afternoon and evening and hot shower in the am Avoid bending, stooping and avoid lifting weights greater than 10 lbs. Avoid prolong standing and walking. Order for a new walker with wheels. Pool walking is a good exercise or the use of a staionary bicycle. Pool walking is a good exercise or the use of a staionary bicycle May wish to consider undergoing faect joint blocks in the lumbar spine to determine if a radiofrequency ablation of the  Lumbar facet would be of benefit.     Follow-Up Instructions: No Follow-up on file.   Orders:  Orders Placed This Encounter  Procedures  . XR Lumbar Spine 2-3 Views   No orders of the defined types were placed in this encounter.     Procedures: No procedures performed   Clinical Data: No additional findings.   Subjective: Chief Complaint  Patient presents with  . Lower Back - Follow-up    66 year old female with history of lumbar laminectomy and decompression for spinal stneosis L3-4 and L4-5. She reports that she is changing pain management programs [facility] is closing and the [facility] is also closed. She has had pain medications from her primary care physician. And she is being scheduled to be seen  in pain management. The meds she is taking for spasm is helping, methocarbamol. She is unable to take lyrica due to leg swelling and blistering. Taking hydrocodone 7.5 mg 325 tablets 4 times per day. In [place] by the same name was closed. The pain specialists she is planning to see is on Sept 10,2018, [facility]. Most of her pain is in her back  But starts in the legs both thighs worse with standing and walking and improves with sitting. She is  Stooping sometimes and the pain worsens with stooping. Using a walker or using the cart to hold herself upright. She uses the motorize cart. No bowel or bladder difficulty.    Review of Systems  Constitutional: Negative for activity change, appetite change, chills, diaphoresis, fatigue, fever and unexpected weight change.  HENT: Negative for congestion, dental problem, drooling, ear discharge, ear pain, facial swelling, hearing loss, sinus pain, sinus pressure, sneezing, sore throat, tinnitus, trouble swallowing and voice change.   Eyes: Negative for pain, discharge, redness and itching.  Respiratory: Positive for chest tightness, shortness of breath and wheezing.   Cardiovascular: Positive for palpitations and leg swelling. Negative for chest pain.  Gastrointestinal: Negative for abdominal distention, abdominal pain, constipation, diarrhea, nausea, rectal pain and vomiting.  Genitourinary: Negative for difficulty urinating, dyspareunia, dysuria, enuresis, flank pain, frequency, genital sores, hematuria, menstrual problem, pelvic pain and urgency.  Musculoskeletal: Positive for back pain and gait problem. Negative for arthralgias.  Skin: Negative for color change, pallor, rash and wound.  Neurological: Positive for weakness and numbness.  Psychiatric/Behavioral: Negative for behavioral problems, confusion, decreased concentration, dysphoric mood, hallucinations, self-injury, sleep disturbance and suicidal ideas. The patient  is not nervous/anxious and is not hyperactive.      Objective: Vital Signs: BP (!) 98/52 (BP Location: Left Arm, Patient Position: Sitting)   Pulse (!) 59   Ht 5\' 3"  (1.6 m)   Wt 265 lb (120.2 kg)   BMI 46.94 kg/m   Physical Exam  Constitutional: She is oriented to person, place, and time. She appears well-developed and well-nourished.  HENT:  Head: Normocephalic and atraumatic.  Eyes: Pupils are equal, round, and reactive to light. EOM are normal.  Neck: Normal range of motion. Neck supple.  Pulmonary/Chest: Effort normal and breath sounds normal.  Abdominal: Soft. Bowel sounds are normal.  Neurological: She is alert and oriented to person, place, and time.  Skin: Skin is warm and dry.  Psychiatric: She has a normal mood and affect. Her behavior is normal. Judgment and thought content normal.    Back Exam   Tenderness  The patient is experiencing tenderness in the lumbar.  Range of Motion  Extension: abnormal  Flexion: abnormal  Lateral Bend Right: abnormal  Lateral Bend Left: abnormal  Rotation Right: abnormal   Muscle Strength  Right Quadriceps:  5/5  Left Quadriceps:  5/5  Right Hamstrings:  5/5  Left Hamstrings:  5/5   Tests  Straight leg raise right: negative Straight leg raise left: negative  Reflexes  Patellar: normal Achilles: normal Babinski's sign: normal   Other  Toe Walk: normal Heel Walk: normal      Specialty Comments:  No specialty comments available.  Imaging: No results found.   PMFS History: Patient Active Problem List   Diagnosis Date Noted  . Stenosis, spinal, lumbar 09/25/2012    Priority: High  . IFG (impaired fasting glucose) 03/22/2016  . Neurogenic claudication due to lumbar spinal stenosis 03/01/2016  . Onychomycosis 06/16/2011  . Insulin resistance 05/17/2011  . Metabolic syndrome 16/55/3748  . Morbid obesity ([place]) 04/26/2011  . Nonspecific (abnormal) findings on radiological and other examination of  gastrointestinal tract 06/02/2010  . Unspecified constipation 06/02/2010  . Personal history of colonic polyps 06/02/2010  . MOOD DISORDER 05/11/2010  . HYPERTENSION, BENIGN 05/11/2010  . SNORING 05/11/2010  . NOCTURIA 05/11/2010  . RENAL CYST, RIGHT 01/03/2008  . SIGMOID POLYP 04/27/2007  . HEMORRHOIDS, INTERNAL 04/27/2007  . GERD 08/25/2006  . Hyperlipidemia 05/23/2006   Past Medical History:  Diagnosis Date  . Adenomatous polyps 2004    Colonoscopy done in [facility]  . Arthritis   . Back pain   . Complication of anesthesia   . COPD (chronic obstructive pulmonary disease) ([facility])   . Dyspnea    pain contributes but also pt. reveals that she has been out of her Spiriva for one month +  . GERD (gastroesophageal reflux disease)   . Hemorrhoids   . Hyperlipidemia   . Hypertension   . Osteoarthritis   . PONV (postoperative nausea and vomiting)    scopolamine patch used with success with a prev. surg.     Family History  Problem Relation Age of Onset  . Depression Mother   . Hypertension Mother   . Hyperlipidemia Mother   . Cancer Mother        lung  . COPD Mother   .  Hyperlipidemia Father   . Hypertension Father   . Colon cancer Neg Hx        Neg     Past Surgical History:  Procedure Laterality Date  . CHOLECYSTECTOMY  05-08-07  . JOINT REPLACEMENT Bilateral   . left knee replacement  3-07   right on 2-06  . LUMBAR LAMINECTOMY/DECOMPRESSION MICRODISCECTOMY N/A 03/01/2016   Procedure: LEFT AND CENTRAL L3-4 AND LEFT L4-5 DECOMPRESSIVE LUMBAR LAMINECTOMY FOR SPINAL STENOSIS;  Surgeon: Jessy Oto, MD;  Location: [facility];  Service: Orthopedics;  Laterality: N/A;  . MULTIPLE TOOTH EXTRACTIONS     Social History   Occupational History  . Not on file.   Social History Main Topics  . Smoking status: Former Smoker    Packs/day: 1.00    Quit date: 03/15/2015  . Smokeless tobacco: Never Used     Comment: very rare use, she remarks "I had one cigarette today, reports  thia is the first one in a month"  . Alcohol use 0.0 oz/week     Comment: one drink a week   . Drug use: No  . Sexual activity: Not on file

## 2016-11-15 ENCOUNTER — Encounter: Payer: Self-pay | Admitting: Family Medicine

## 2016-11-15 ENCOUNTER — Other Ambulatory Visit: Payer: Self-pay | Admitting: *Deleted

## 2016-11-15 ENCOUNTER — Other Ambulatory Visit: Payer: Self-pay | Admitting: Family Medicine

## 2016-11-15 ENCOUNTER — Ambulatory Visit (INDEPENDENT_AMBULATORY_CARE_PROVIDER_SITE_OTHER): Payer: Medicare HMO | Admitting: Family Medicine

## 2016-11-15 VITALS — BP 133/84 | HR 78 | Ht 63.0 in | Wt 270.0 lb

## 2016-11-15 DIAGNOSIS — Z636 Dependent relative needing care at home: Secondary | ICD-10-CM

## 2016-11-15 DIAGNOSIS — R0602 Shortness of breath: Secondary | ICD-10-CM | POA: Diagnosis not present

## 2016-11-15 DIAGNOSIS — Z720 Tobacco use: Secondary | ICD-10-CM

## 2016-11-15 DIAGNOSIS — J449 Chronic obstructive pulmonary disease, unspecified: Secondary | ICD-10-CM | POA: Diagnosis not present

## 2016-11-15 DIAGNOSIS — R69 Illness, unspecified: Secondary | ICD-10-CM | POA: Diagnosis not present

## 2016-11-15 MED ORDER — UMECLIDINIUM-VILANTEROL 62.5-25 MCG/INH IN AEPB: 1.0000 | INHALATION_SPRAY | Freq: Every day | RESPIRATORY_TRACT | 99 refills | Status: DC

## 2016-11-15 MED ORDER — VARENICLINE TARTRATE 1 MG PO TABS
ORAL_TABLET | ORAL | 1 refills | Status: DC
Start: 2016-11-15 — End: 2018-02-27

## 2016-11-15 NOTE — Patient Instructions (Signed)
Changing your INcruse to Anoro which has 2 medicines in it We will work on ordering an overnight oxygen study.

## 2016-11-15 NOTE — Progress Notes (Signed)
Subjective:    Patient ID: Kelly Roth, female    DOB: January 03, 1951, 66 y.o.   MRN: [id number]  HPI 66 year old female with a history of COPD comes in today to  [facility] or not she might qualify for oxygen.  She specifically brought in a pamphlet for a [employer] compressor. She is concerned because she's been using her inhalers more frequently. She's been traveling back and forth to [place] to help take care of her mother who currently lives there. There have been [place] have worsened the air quality. She says infection actually ran out of her rescue inhaler while on the airplane and someone gave her their inhaler which had never been used. She said it happened to be the exact same one that she was using. She notices that after walking and particularly after being in the heat her breathing worsens. She also admits that she's been more stressed helping take care of her mother whose health is starting to fail. She is planning on going back to [place] in a couple of weeks.   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 She says she does well when she goes to [place] because she doesn't want to smoke around her mother. But when she comes home her husband is a heavy smoker and it's just extremely tempting. She wants to be able to take the Chantix when she is at home to help keep herself from smoking.  Review of Systems   BP 133/84   Pulse 78   Ht 5\' 3"  (1.6 m)   Wt 270 lb (122.5 kg)   SpO2 96%   BMI 47.83 kg/m     No Known Allergies  Past Medical History:  Diagnosis Date  . Adenomatous polyps 2004    Colonoscopy done in [facility]  . Arthritis   . Back pain   . Complication of anesthesia   . COPD (chronic obstructive pulmonary disease) ([place])   . Dyspnea    pain contributes but also pt. reveals that she has been out of her Spiriva for one month +  . GERD (gastroesophageal reflux disease)   . Hemorrhoids   . Hyperlipidemia   . Hypertension   .  Osteoarthritis   . PONV (postoperative nausea and vomiting)    scopolamine patch used with success with a prev. surg.     Past Surgical History:  Procedure Laterality Date  . CHOLECYSTECTOMY  05-08-07  . JOINT REPLACEMENT Bilateral   . left knee replacement  3-07   right on 2-06  . LUMBAR LAMINECTOMY/DECOMPRESSION MICRODISCECTOMY N/A 03/01/2016   Procedure: LEFT AND CENTRAL L3-4 AND LEFT L4-5 DECOMPRESSIVE LUMBAR LAMINECTOMY FOR SPINAL STENOSIS;  Surgeon: Jessy Oto, MD;  Location: [facility];  Service: Orthopedics;  Laterality: N/A;  . MULTIPLE TOOTH EXTRACTIONS      Social History   Social History  . Marital status: Married    Spouse name: N/A  . Number of children: N/A  . Years of education: N/A   Occupational History  . Not on file.   Social History Main Topics  . Smoking status: Former Smoker    Packs/day: 1.00    Quit date: 03/15/2015  . Smokeless tobacco: Never Used     Comment: very rare use, she remarks "I had one cigarette today, reports thia is the first one in a month"  . Alcohol use 0.0 oz/week     Comment: one drink a week   . Drug  use: No  . Sexual activity: Not on file   Other Topics Concern  . Not on file   Social History Narrative  . No narrative on file    Family History  Problem Relation Age of Onset  . Depression Mother   . Hypertension Mother   . Hyperlipidemia Mother   . Cancer Mother        lung  . COPD Mother   . Hyperlipidemia Father   . Hypertension Father   . Colon cancer Neg Hx        Neg     Outpatient Encounter Prescriptions as of 11/15/2016  Medication Sig  . fish oil-omega-3 fatty acids 1000 MG capsule Take 1 g by mouth daily.   . furosemide (LASIX) 40 MG tablet TAKE 2 TABLET TWICE A DAY AS NEEDED LEG SWELLING  . HYDROcodone-acetaminophen (NORCO) 7.5-325 MG tablet Take 1-2 tablets by mouth every 4 (four) hours as needed for moderate pain.  Marland Kitchen lisinopril-hydrochlorothiazide (PRINZIDE,ZESTORETIC) 20-12.5 MG tablet TAKE 1 TABLET BY  MOUTH DAILY.  . methocarbamol (ROBAXIN) 500 MG tablet Take 1 tablet (500 mg total) by mouth every 8 (eight) hours as needed for muscle spasms.  . Multiple Vitamin (MULTIVITAMIN) capsule Take 1 capsule by mouth daily.    . nebivolol (BYSTOLIC) 2.5 MG tablet Take 1 tablet (2.5 mg total) by mouth daily.  . piroxicam (FELDENE) 10 MG capsule Take 1 capsule (10 mg total) by mouth 2 (two) times daily.  Marland Kitchen umeclidinium bromide (INCRUSE ELLIPTA) 62.5 MCG/INH AEPB Inhale 1 puff into the lungs daily.  . VENTOLIN HFA 108 (90 Base) MCG/ACT inhaler INHALE 2 PUFFS EVERY 6 HOURS AS NEEDED FOR WHEEZING OR SHORTNESS OF BREATH  . [DISCONTINUED] rosuvastatin (CRESTOR) 40 MG tablet Take 1 tablet (40 mg total) by mouth daily.  Marland Kitchen umeclidinium-vilanterol (ANORO ELLIPTA) 62.5-25 MCG/INH AEPB Inhale 1 puff into the lungs daily.  . varenicline (CHANTIX) 1 MG tablet TAKE 1 TABLET (1 MG TOTAL) BY MOUTH 2 (TWO) TIMES DAILY.  . [DISCONTINUED] cyclobenzaprine (FLEXERIL) 5 MG tablet Take 5 mg by mouth 3 (three) times daily as needed for muscle spasms.    No facility-administered encounter medications on file as of 11/15/2016.           Objective:   Physical Exam  Constitutional: She is oriented to person, place, and time. She appears well-developed and well-nourished.  HENT:  Head: Normocephalic and atraumatic.  Cardiovascular: Normal rate, regular rhythm and normal heart sounds.   Pulmonary/Chest: Effort normal and breath sounds normal.  Neurological: She is alert and oriented to person, place, and time.  Skin: Skin is warm and dry.  Psychiatric: She has a normal mood and affect. Her behavior is normal.        Assessment & Plan:  COPD-We did perform a 6 minute walk test. She actually did fairly well. Her oxygen only dropped to 94%. Because of her obesity and body habitus and wide neck I think we should also check her for overnight oximetry. Will get this order placed. We also had her complete a stop bang  questionnaire. She screened positive for that as well. We'll start with the overnight oxygen first to see if she qualifies and then consider testing for sleep apnea if needed. I'm also going to switch her Incruse ever to a normal dose of it has a long-acting albuterol and it.  Tob abuse - will refill Chantix. She's actually done really well on in the past.  Caregiver burden-Increased stress recently and helping to  take care of her ailing mother.

## 2016-11-16 MED ORDER — AMBULATORY NON FORMULARY MEDICATION: 0 refills | Status: DC

## 2016-11-17 DIAGNOSIS — I358 Other nonrheumatic aortic valve disorders: Secondary | ICD-10-CM | POA: Diagnosis not present

## 2016-11-17 DIAGNOSIS — I517 Cardiomegaly: Secondary | ICD-10-CM | POA: Diagnosis not present

## 2016-11-21 DIAGNOSIS — Z79899 Other long term (current) drug therapy: Secondary | ICD-10-CM | POA: Diagnosis not present

## 2016-11-21 DIAGNOSIS — M47816 Spondylosis without myelopathy or radiculopathy, lumbar region: Secondary | ICD-10-CM | POA: Diagnosis not present

## 2016-11-21 DIAGNOSIS — G894 Chronic pain syndrome: Secondary | ICD-10-CM | POA: Diagnosis not present

## 2016-11-21 DIAGNOSIS — I10 Essential (primary) hypertension: Secondary | ICD-10-CM | POA: Diagnosis not present

## 2016-11-21 DIAGNOSIS — M961 Postlaminectomy syndrome, not elsewhere classified: Secondary | ICD-10-CM | POA: Diagnosis not present

## 2016-11-21 DIAGNOSIS — Z5181 Encounter for therapeutic drug level monitoring: Secondary | ICD-10-CM | POA: Diagnosis not present

## 2016-12-12 ENCOUNTER — Other Ambulatory Visit: Payer: Self-pay | Admitting: Family Medicine

## 2016-12-21 DIAGNOSIS — G894 Chronic pain syndrome: Secondary | ICD-10-CM | POA: Diagnosis not present

## 2016-12-21 DIAGNOSIS — I10 Essential (primary) hypertension: Secondary | ICD-10-CM | POA: Diagnosis not present

## 2016-12-21 DIAGNOSIS — M961 Postlaminectomy syndrome, not elsewhere classified: Secondary | ICD-10-CM | POA: Diagnosis not present

## 2016-12-21 DIAGNOSIS — M5136 Other intervertebral disc degeneration, lumbar region: Secondary | ICD-10-CM | POA: Diagnosis not present

## 2016-12-21 DIAGNOSIS — M47816 Spondylosis without myelopathy or radiculopathy, lumbar region: Secondary | ICD-10-CM | POA: Diagnosis not present

## 2016-12-27 ENCOUNTER — Ambulatory Visit: Payer: Medicare HMO | Admitting: Family Medicine

## 2016-12-27 DIAGNOSIS — Z0189 Encounter for other specified special examinations: Secondary | ICD-10-CM

## 2017-01-02 DIAGNOSIS — M47816 Spondylosis without myelopathy or radiculopathy, lumbar region: Secondary | ICD-10-CM | POA: Diagnosis not present

## 2017-02-14 DIAGNOSIS — I483 Typical atrial flutter: Secondary | ICD-10-CM | POA: Diagnosis not present

## 2017-02-14 DIAGNOSIS — I471 Supraventricular tachycardia: Secondary | ICD-10-CM | POA: Diagnosis not present

## 2017-02-14 DIAGNOSIS — I10 Essential (primary) hypertension: Secondary | ICD-10-CM | POA: Diagnosis not present

## 2017-02-15 DIAGNOSIS — G894 Chronic pain syndrome: Secondary | ICD-10-CM | POA: Diagnosis not present

## 2017-02-15 DIAGNOSIS — I10 Essential (primary) hypertension: Secondary | ICD-10-CM | POA: Diagnosis not present

## 2017-02-15 DIAGNOSIS — M47816 Spondylosis without myelopathy or radiculopathy, lumbar region: Secondary | ICD-10-CM | POA: Diagnosis not present

## 2017-02-15 DIAGNOSIS — M533 Sacrococcygeal disorders, not elsewhere classified: Secondary | ICD-10-CM | POA: Diagnosis not present

## 2017-02-15 DIAGNOSIS — M961 Postlaminectomy syndrome, not elsewhere classified: Secondary | ICD-10-CM | POA: Diagnosis not present

## 2017-02-22 ENCOUNTER — Other Ambulatory Visit: Payer: Self-pay | Admitting: Family Medicine

## 2017-03-14 HISTORY — PX: ATRIAL ABLATION SURGERY: SHX560

## 2017-04-17 ENCOUNTER — Other Ambulatory Visit: Payer: Self-pay | Admitting: Family Medicine

## 2017-04-17 DIAGNOSIS — G894 Chronic pain syndrome: Secondary | ICD-10-CM | POA: Diagnosis not present

## 2017-04-17 DIAGNOSIS — I10 Essential (primary) hypertension: Secondary | ICD-10-CM | POA: Diagnosis not present

## 2017-04-17 DIAGNOSIS — M961 Postlaminectomy syndrome, not elsewhere classified: Secondary | ICD-10-CM | POA: Diagnosis not present

## 2017-04-17 DIAGNOSIS — M47816 Spondylosis without myelopathy or radiculopathy, lumbar region: Secondary | ICD-10-CM | POA: Diagnosis not present

## 2017-04-17 DIAGNOSIS — M533 Sacrococcygeal disorders, not elsewhere classified: Secondary | ICD-10-CM | POA: Diagnosis not present

## 2017-05-10 ENCOUNTER — Ambulatory Visit (INDEPENDENT_AMBULATORY_CARE_PROVIDER_SITE_OTHER): Payer: Medicare HMO | Admitting: Specialist

## 2017-05-16 ENCOUNTER — Ambulatory Visit (INDEPENDENT_AMBULATORY_CARE_PROVIDER_SITE_OTHER): Payer: Self-pay | Admitting: Specialist

## 2017-06-07 ENCOUNTER — Other Ambulatory Visit: Payer: Self-pay | Admitting: Family Medicine

## 2017-06-08 DIAGNOSIS — I483 Typical atrial flutter: Secondary | ICD-10-CM | POA: Diagnosis not present

## 2017-06-08 DIAGNOSIS — I10 Essential (primary) hypertension: Secondary | ICD-10-CM | POA: Diagnosis not present

## 2017-06-08 DIAGNOSIS — I428 Other cardiomyopathies: Secondary | ICD-10-CM | POA: Diagnosis not present

## 2017-06-08 DIAGNOSIS — I471 Supraventricular tachycardia: Secondary | ICD-10-CM | POA: Diagnosis not present

## 2017-06-12 DIAGNOSIS — Z79899 Other long term (current) drug therapy: Secondary | ICD-10-CM | POA: Diagnosis not present

## 2017-06-12 DIAGNOSIS — I10 Essential (primary) hypertension: Secondary | ICD-10-CM | POA: Diagnosis not present

## 2017-06-12 DIAGNOSIS — Z5181 Encounter for therapeutic drug level monitoring: Secondary | ICD-10-CM | POA: Diagnosis not present

## 2017-06-12 DIAGNOSIS — G894 Chronic pain syndrome: Secondary | ICD-10-CM | POA: Diagnosis not present

## 2017-06-12 DIAGNOSIS — M533 Sacrococcygeal disorders, not elsewhere classified: Secondary | ICD-10-CM | POA: Diagnosis not present

## 2017-07-17 DIAGNOSIS — M533 Sacrococcygeal disorders, not elsewhere classified: Secondary | ICD-10-CM | POA: Diagnosis not present

## 2017-07-17 DIAGNOSIS — M961 Postlaminectomy syndrome, not elsewhere classified: Secondary | ICD-10-CM | POA: Diagnosis not present

## 2017-07-17 DIAGNOSIS — M47816 Spondylosis without myelopathy or radiculopathy, lumbar region: Secondary | ICD-10-CM | POA: Diagnosis not present

## 2017-07-17 DIAGNOSIS — I10 Essential (primary) hypertension: Secondary | ICD-10-CM | POA: Diagnosis not present

## 2017-07-17 DIAGNOSIS — G894 Chronic pain syndrome: Secondary | ICD-10-CM | POA: Diagnosis not present

## 2017-08-14 ENCOUNTER — Other Ambulatory Visit: Payer: Self-pay | Admitting: Family Medicine

## 2017-09-13 ENCOUNTER — Other Ambulatory Visit: Payer: Self-pay | Admitting: Family Medicine

## 2017-09-13 DIAGNOSIS — G894 Chronic pain syndrome: Secondary | ICD-10-CM | POA: Diagnosis not present

## 2017-09-13 DIAGNOSIS — M47816 Spondylosis without myelopathy or radiculopathy, lumbar region: Secondary | ICD-10-CM | POA: Diagnosis not present

## 2017-09-13 DIAGNOSIS — I10 Essential (primary) hypertension: Secondary | ICD-10-CM | POA: Diagnosis not present

## 2017-09-13 DIAGNOSIS — M533 Sacrococcygeal disorders, not elsewhere classified: Secondary | ICD-10-CM | POA: Diagnosis not present

## 2017-09-13 DIAGNOSIS — M961 Postlaminectomy syndrome, not elsewhere classified: Secondary | ICD-10-CM | POA: Diagnosis not present

## 2017-09-16 ENCOUNTER — Other Ambulatory Visit: Payer: Self-pay | Admitting: Family Medicine

## 2017-10-15 ENCOUNTER — Other Ambulatory Visit: Payer: Self-pay | Admitting: Family Medicine

## 2017-10-21 ENCOUNTER — Other Ambulatory Visit: Payer: Self-pay | Admitting: Family Medicine

## 2017-10-23 ENCOUNTER — Other Ambulatory Visit: Payer: Self-pay

## 2017-10-23 NOTE — Patient Outreach (Signed)
[facility]) Care Management  10/23/2017  AMALIE KORAN [birth year] [id number]   Medication Adherence call to Mrs. Clarisa Schools call patient no answer and no voice mail patient is due on Lisinopril / Hctz 20/12.5 mg. Mrs. Kegel is showing past due under [place].   [facility] Management Direct Dial [phone]  Fax [phone] [email or web address]

## 2017-11-09 DIAGNOSIS — G894 Chronic pain syndrome: Secondary | ICD-10-CM | POA: Diagnosis not present

## 2017-11-09 DIAGNOSIS — I10 Essential (primary) hypertension: Secondary | ICD-10-CM | POA: Diagnosis not present

## 2017-11-09 DIAGNOSIS — M47816 Spondylosis without myelopathy or radiculopathy, lumbar region: Secondary | ICD-10-CM | POA: Diagnosis not present

## 2017-11-09 DIAGNOSIS — M533 Sacrococcygeal disorders, not elsewhere classified: Secondary | ICD-10-CM | POA: Diagnosis not present

## 2017-11-09 DIAGNOSIS — M961 Postlaminectomy syndrome, not elsewhere classified: Secondary | ICD-10-CM | POA: Diagnosis not present

## 2017-11-16 DIAGNOSIS — M533 Sacrococcygeal disorders, not elsewhere classified: Secondary | ICD-10-CM | POA: Diagnosis not present

## 2017-12-20 DIAGNOSIS — I483 Typical atrial flutter: Secondary | ICD-10-CM | POA: Diagnosis not present

## 2017-12-20 DIAGNOSIS — I10 Essential (primary) hypertension: Secondary | ICD-10-CM | POA: Diagnosis not present

## 2017-12-20 DIAGNOSIS — I4892 Unspecified atrial flutter: Secondary | ICD-10-CM | POA: Diagnosis not present

## 2017-12-21 DIAGNOSIS — M533 Sacrococcygeal disorders, not elsewhere classified: Secondary | ICD-10-CM | POA: Insufficient documentation

## 2017-12-28 DIAGNOSIS — J449 Chronic obstructive pulmonary disease, unspecified: Secondary | ICD-10-CM | POA: Diagnosis not present

## 2017-12-28 DIAGNOSIS — E785 Hyperlipidemia, unspecified: Secondary | ICD-10-CM | POA: Diagnosis not present

## 2017-12-28 DIAGNOSIS — I428 Other cardiomyopathies: Secondary | ICD-10-CM | POA: Diagnosis not present

## 2017-12-28 DIAGNOSIS — Z79899 Other long term (current) drug therapy: Secondary | ICD-10-CM | POA: Diagnosis not present

## 2017-12-28 DIAGNOSIS — I483 Typical atrial flutter: Secondary | ICD-10-CM | POA: Diagnosis not present

## 2017-12-28 DIAGNOSIS — I10 Essential (primary) hypertension: Secondary | ICD-10-CM | POA: Diagnosis not present

## 2017-12-29 DIAGNOSIS — I340 Nonrheumatic mitral (valve) insufficiency: Secondary | ICD-10-CM | POA: Diagnosis not present

## 2017-12-29 DIAGNOSIS — I483 Typical atrial flutter: Secondary | ICD-10-CM | POA: Diagnosis not present

## 2017-12-29 DIAGNOSIS — R935 Abnormal findings on diagnostic imaging of other abdominal regions, including retroperitoneum: Secondary | ICD-10-CM | POA: Diagnosis not present

## 2017-12-29 DIAGNOSIS — I361 Nonrheumatic tricuspid (valve) insufficiency: Secondary | ICD-10-CM | POA: Diagnosis not present

## 2017-12-29 DIAGNOSIS — I313 Pericardial effusion (noninflammatory): Secondary | ICD-10-CM | POA: Diagnosis not present

## 2017-12-29 DIAGNOSIS — Z79899 Other long term (current) drug therapy: Secondary | ICD-10-CM | POA: Diagnosis not present

## 2017-12-29 DIAGNOSIS — R0989 Other specified symptoms and signs involving the circulatory and respiratory systems: Secondary | ICD-10-CM | POA: Diagnosis not present

## 2017-12-29 DIAGNOSIS — E785 Hyperlipidemia, unspecified: Secondary | ICD-10-CM | POA: Diagnosis not present

## 2017-12-29 DIAGNOSIS — R0602 Shortness of breath: Secondary | ICD-10-CM | POA: Diagnosis not present

## 2017-12-29 DIAGNOSIS — I428 Other cardiomyopathies: Secondary | ICD-10-CM | POA: Diagnosis not present

## 2017-12-29 DIAGNOSIS — J449 Chronic obstructive pulmonary disease, unspecified: Secondary | ICD-10-CM | POA: Diagnosis not present

## 2017-12-29 DIAGNOSIS — I10 Essential (primary) hypertension: Secondary | ICD-10-CM | POA: Diagnosis not present

## 2017-12-29 DIAGNOSIS — I517 Cardiomegaly: Secondary | ICD-10-CM | POA: Diagnosis not present

## 2017-12-30 DIAGNOSIS — Z79899 Other long term (current) drug therapy: Secondary | ICD-10-CM | POA: Diagnosis not present

## 2017-12-30 DIAGNOSIS — R935 Abnormal findings on diagnostic imaging of other abdominal regions, including retroperitoneum: Secondary | ICD-10-CM | POA: Diagnosis not present

## 2017-12-30 DIAGNOSIS — E785 Hyperlipidemia, unspecified: Secondary | ICD-10-CM | POA: Diagnosis not present

## 2017-12-30 DIAGNOSIS — I10 Essential (primary) hypertension: Secondary | ICD-10-CM | POA: Diagnosis not present

## 2017-12-30 DIAGNOSIS — I428 Other cardiomyopathies: Secondary | ICD-10-CM | POA: Diagnosis not present

## 2017-12-30 DIAGNOSIS — I483 Typical atrial flutter: Secondary | ICD-10-CM | POA: Diagnosis not present

## 2017-12-30 DIAGNOSIS — J449 Chronic obstructive pulmonary disease, unspecified: Secondary | ICD-10-CM | POA: Diagnosis not present

## 2017-12-31 DIAGNOSIS — Z79899 Other long term (current) drug therapy: Secondary | ICD-10-CM | POA: Diagnosis not present

## 2017-12-31 DIAGNOSIS — I10 Essential (primary) hypertension: Secondary | ICD-10-CM | POA: Diagnosis not present

## 2017-12-31 DIAGNOSIS — E785 Hyperlipidemia, unspecified: Secondary | ICD-10-CM | POA: Diagnosis not present

## 2017-12-31 DIAGNOSIS — I428 Other cardiomyopathies: Secondary | ICD-10-CM | POA: Diagnosis not present

## 2017-12-31 DIAGNOSIS — J449 Chronic obstructive pulmonary disease, unspecified: Secondary | ICD-10-CM | POA: Diagnosis not present

## 2017-12-31 DIAGNOSIS — I483 Typical atrial flutter: Secondary | ICD-10-CM | POA: Diagnosis not present

## 2018-01-01 DIAGNOSIS — J449 Chronic obstructive pulmonary disease, unspecified: Secondary | ICD-10-CM | POA: Diagnosis not present

## 2018-01-01 DIAGNOSIS — I10 Essential (primary) hypertension: Secondary | ICD-10-CM | POA: Diagnosis not present

## 2018-01-01 DIAGNOSIS — I483 Typical atrial flutter: Secondary | ICD-10-CM | POA: Diagnosis not present

## 2018-01-01 DIAGNOSIS — Z79899 Other long term (current) drug therapy: Secondary | ICD-10-CM | POA: Diagnosis not present

## 2018-01-01 DIAGNOSIS — E785 Hyperlipidemia, unspecified: Secondary | ICD-10-CM | POA: Diagnosis not present

## 2018-01-01 DIAGNOSIS — I428 Other cardiomyopathies: Secondary | ICD-10-CM | POA: Diagnosis not present

## 2018-01-04 DIAGNOSIS — G894 Chronic pain syndrome: Secondary | ICD-10-CM | POA: Diagnosis not present

## 2018-01-04 DIAGNOSIS — I10 Essential (primary) hypertension: Secondary | ICD-10-CM | POA: Diagnosis not present

## 2018-01-04 DIAGNOSIS — Z5181 Encounter for therapeutic drug level monitoring: Secondary | ICD-10-CM | POA: Diagnosis not present

## 2018-01-04 DIAGNOSIS — M533 Sacrococcygeal disorders, not elsewhere classified: Secondary | ICD-10-CM | POA: Diagnosis not present

## 2018-01-04 DIAGNOSIS — Z79899 Other long term (current) drug therapy: Secondary | ICD-10-CM | POA: Diagnosis not present

## 2018-01-09 DIAGNOSIS — J449 Chronic obstructive pulmonary disease, unspecified: Secondary | ICD-10-CM | POA: Diagnosis not present

## 2018-01-09 DIAGNOSIS — E785 Hyperlipidemia, unspecified: Secondary | ICD-10-CM | POA: Diagnosis not present

## 2018-01-09 DIAGNOSIS — I428 Other cardiomyopathies: Secondary | ICD-10-CM | POA: Diagnosis not present

## 2018-01-09 DIAGNOSIS — M7918 Myalgia, other site: Secondary | ICD-10-CM | POA: Diagnosis not present

## 2018-01-09 DIAGNOSIS — M545 Low back pain: Secondary | ICD-10-CM | POA: Diagnosis not present

## 2018-01-09 DIAGNOSIS — M533 Sacrococcygeal disorders, not elsewhere classified: Secondary | ICD-10-CM | POA: Diagnosis not present

## 2018-01-09 DIAGNOSIS — I4892 Unspecified atrial flutter: Secondary | ICD-10-CM | POA: Diagnosis not present

## 2018-01-09 DIAGNOSIS — I10 Essential (primary) hypertension: Secondary | ICD-10-CM | POA: Diagnosis not present

## 2018-01-12 DIAGNOSIS — I483 Typical atrial flutter: Secondary | ICD-10-CM | POA: Diagnosis not present

## 2018-01-12 DIAGNOSIS — I10 Essential (primary) hypertension: Secondary | ICD-10-CM | POA: Diagnosis not present

## 2018-01-12 DIAGNOSIS — I4892 Unspecified atrial flutter: Secondary | ICD-10-CM | POA: Diagnosis not present

## 2018-01-12 DIAGNOSIS — R9431 Abnormal electrocardiogram [ECG] [EKG]: Secondary | ICD-10-CM | POA: Diagnosis not present

## 2018-01-15 ENCOUNTER — Other Ambulatory Visit: Payer: Self-pay | Admitting: Family Medicine

## 2018-02-12 ENCOUNTER — Encounter: Payer: Self-pay | Admitting: Gastroenterology

## 2018-02-27 ENCOUNTER — Ambulatory Visit: Payer: Medicare Other | Admitting: Family Medicine

## 2018-02-27 ENCOUNTER — Encounter: Payer: Self-pay | Admitting: Family Medicine

## 2018-02-27 VITALS — BP 146/74 | HR 91 | Ht 63.0 in | Wt 269.0 lb

## 2018-02-27 DIAGNOSIS — Z720 Tobacco use: Secondary | ICD-10-CM

## 2018-02-27 DIAGNOSIS — R7301 Impaired fasting glucose: Secondary | ICD-10-CM | POA: Diagnosis not present

## 2018-02-27 DIAGNOSIS — R0683 Snoring: Secondary | ICD-10-CM | POA: Diagnosis not present

## 2018-02-27 DIAGNOSIS — I10 Essential (primary) hypertension: Secondary | ICD-10-CM | POA: Diagnosis not present

## 2018-02-27 DIAGNOSIS — J449 Chronic obstructive pulmonary disease, unspecified: Secondary | ICD-10-CM | POA: Insufficient documentation

## 2018-02-27 LAB — POCT GLYCOSYLATED HEMOGLOBIN (HGB A1C): HEMOGLOBIN A1C: 5.9 % — AB (ref 4.0–5.6)

## 2018-02-27 MED ORDER — ROSUVASTATIN CALCIUM 40 MG PO TABS: 40.0000 mg | ORAL_TABLET | Freq: Every day | ORAL | 3 refills | Status: DC

## 2018-02-27 MED ORDER — PIROXICAM 10 MG PO CAPS: 10.0000 mg | ORAL_CAPSULE | Freq: Two times a day (BID) | ORAL | 1 refills | Status: DC

## 2018-02-27 MED ORDER — LISINOPRIL-HYDROCHLOROTHIAZIDE 20-12.5 MG PO TABS: 1.0000 | ORAL_TABLET | Freq: Every day | ORAL | 1 refills | Status: DC

## 2018-02-27 MED ORDER — VARENICLINE TARTRATE 0.5 MG X 11 & 1 MG X 42 PO MISC: ORAL | 0 refills | Status: DC

## 2018-02-27 MED ORDER — ALBUTEROL SULFATE HFA 108 (90 BASE) MCG/ACT IN AERS: INHALATION_SPRAY | RESPIRATORY_TRACT | 3 refills | Status: DC

## 2018-02-27 MED ORDER — UMECLIDINIUM-VILANTEROL 62.5-25 MCG/INH IN AEPB: 1.0000 | INHALATION_SPRAY | Freq: Every day | RESPIRATORY_TRACT | 6 refills | Status: DC

## 2018-02-27 NOTE — Assessment & Plan Note (Signed)
Stop bang questionnaire score of 4.

## 2018-02-27 NOTE — Progress Notes (Signed)
osa

## 2018-02-27 NOTE — Progress Notes (Signed)
Subjective:    CC: F/U glucose and bp   HPI:  Hypertension- Pt denies chest pain, SOB, dizziness, or heart palpitations.  Taking meds as directed w/o problems.  Denies medication side effects.    Impaired fasting glucose-no increased thirst or urination. No symptoms consistent with hypoglycemia.  COPD-unfortunately she started smoking again about 3 months ago.  Her brother went to stay with her mother and this caused her a lot of stress so she started smoking again.  She would like to quit and try the Chantix one more time.  Since smoking she is noticed increased cough and sputum production.  She denies any fevers chills or sweats.  Does report snoring and sometimes waking up and noticing her blood pressures extra little higher in the morning that it is later in the day.  Never been tested for sleep apnea.  Also wanted to let me know that several weeks ago she actually fell out of bed and landed on her right shoulder and hit her shoulder and her right chest and breast area.  She says it took several weeks for her to feel like she could move her right arm and shoulder without pain or difficulty.  She finally feels like she is better.  She is now sleeping on the opposite side of the bed so she does not roll out of it by accident.  Past medical history, Surgical history, Family history not pertinant except as noted below, Social history, Allergies, and medications have been entered into the medical record, reviewed, and corrections made.   Review of Systems: No fevers, chills, night sweats, weight loss, chest pain, or shortness of breath.   Objective:    General: Well Developed, well nourished, and in no acute distress.  Neuro: Alert and oriented x3, extra-ocular muscles intact, sensation grossly intact.  HEENT: Normocephalic, atraumatic  Skin: Warm and dry, no rashes. Cardiac: Regular rate and rhythm, no murmurs rubs or gallops, no lower extremity edema.  Respiratory: Clear to auscultation  bilaterally. Not using accessory muscles, speaking in full sentences.   Impression and Recommendations:    IFG -up from previous.  Hemoglobin A1c of 5.9 today.  Continue to work on diet and exercise.  She is actually lost 3 pounds by trying to diet recently so just encouraged her to continue to work on that.Marland Kitchen  COPD-she would like to quit smoking.  She is done well with Chantix in the past.  Tobacco abuse-we will start Chantix again.  HTN -blood pressure mildly elevated today.  She reports that she is taking her medications.  Refill sent to pharmacy we will follow-up in a couple months and recheck at that time.  Snoring-Did have her complete a stop bang questionnaire she did screen positive I do think she would benefit from some sleep apnea testing.  She is actually supposed to have some major dental work done in February and would prefer to wait until after that.  History of atrial fibrillation she also wanted to let me know that since I last saw her she actually had an ablation for atrial fibrillation she is been doing well since then.

## 2018-03-01 DIAGNOSIS — Z5181 Encounter for therapeutic drug level monitoring: Secondary | ICD-10-CM | POA: Diagnosis not present

## 2018-03-01 DIAGNOSIS — Z79899 Other long term (current) drug therapy: Secondary | ICD-10-CM | POA: Diagnosis not present

## 2018-03-01 DIAGNOSIS — I10 Essential (primary) hypertension: Secondary | ICD-10-CM | POA: Diagnosis not present

## 2018-03-01 DIAGNOSIS — G894 Chronic pain syndrome: Secondary | ICD-10-CM | POA: Diagnosis not present

## 2018-03-01 DIAGNOSIS — M533 Sacrococcygeal disorders, not elsewhere classified: Secondary | ICD-10-CM | POA: Diagnosis not present

## 2018-03-12 DIAGNOSIS — I10 Essential (primary) hypertension: Secondary | ICD-10-CM | POA: Diagnosis not present

## 2018-03-12 DIAGNOSIS — R7301 Impaired fasting glucose: Secondary | ICD-10-CM | POA: Diagnosis not present

## 2018-03-13 LAB — COMPLETE METABOLIC PANEL WITH GFR
AG Ratio: 1.6 (calc) (ref 1.0–2.5)
ALKALINE PHOSPHATASE (APISO): 119 U/L (ref 33–130)
ALT: 17 U/L (ref 6–29)
AST: 15 U/L (ref 10–35)
Albumin: 4.2 g/dL (ref 3.6–5.1)
BILIRUBIN TOTAL: 0.9 mg/dL (ref 0.2–1.2)
BUN: 15 mg/dL (ref 7–25)
CHLORIDE: 98 mmol/L (ref 98–110)
CO2: 29 mmol/L (ref 20–32)
CREATININE: 0.78 mg/dL (ref 0.50–0.99)
Calcium: 10.3 mg/dL (ref 8.6–10.4)
GFR, Est African American: 92 mL/min/{1.73_m2} (ref >=60)
GFR, Est Non African American: 80 mL/min/{1.73_m2} (ref >=60)
GLUCOSE: 131 mg/dL — AB (ref 65–99)
Globulin: 2.6 g/dL (calc) (ref 1.9–3.7)
Potassium: 3.9 mmol/L (ref 3.5–5.3)
Sodium: 136 mmol/L (ref 135–146)
Total Protein: 6.8 g/dL (ref 6.1–8.1)

## 2018-03-13 LAB — LIPID PANEL
CHOL/HDL RATIO: 3.4 (calc) (ref <5.0)
CHOLESTEROL: 127 mg/dL (ref <200)
HDL: 37 mg/dL — ABNORMAL LOW (ref >50)
LDL CHOLESTEROL (CALC): 71 mg/dL
Non-HDL Cholesterol (Calc): 90 mg/dL (calc) (ref <130)
TRIGLYCERIDES: 106 mg/dL (ref <150)

## 2018-03-13 LAB — TSH: TSH: 2.35 m[IU]/L (ref 0.40–4.50)

## 2018-03-18 ENCOUNTER — Encounter: Payer: Self-pay | Admitting: Gastroenterology

## 2018-03-26 ENCOUNTER — Other Ambulatory Visit: Payer: Self-pay | Admitting: *Deleted

## 2018-03-26 DIAGNOSIS — J449 Chronic obstructive pulmonary disease, unspecified: Secondary | ICD-10-CM

## 2018-03-26 DIAGNOSIS — R0602 Shortness of breath: Secondary | ICD-10-CM

## 2018-03-26 MED ORDER — ALBUTEROL SULFATE HFA 108 (90 BASE) MCG/ACT IN AERS: 2.0000 | INHALATION_SPRAY | Freq: Four times a day (QID) | RESPIRATORY_TRACT | 3 refills | Status: DC | PRN

## 2018-04-02 ENCOUNTER — Other Ambulatory Visit: Payer: Self-pay | Admitting: Family Medicine

## 2018-04-02 ENCOUNTER — Other Ambulatory Visit: Payer: Self-pay | Admitting: *Deleted

## 2018-04-02 DIAGNOSIS — Z720 Tobacco use: Secondary | ICD-10-CM

## 2018-04-02 MED ORDER — VARENICLINE TARTRATE 1 MG PO TABS: 1.0000 mg | ORAL_TABLET | Freq: Two times a day (BID) | ORAL | 1 refills | Status: DC

## 2018-04-05 ENCOUNTER — Other Ambulatory Visit: Payer: Self-pay | Admitting: Family Medicine

## 2018-04-23 ENCOUNTER — Other Ambulatory Visit: Payer: Self-pay | Admitting: Family Medicine

## 2018-04-23 ENCOUNTER — Other Ambulatory Visit: Payer: Self-pay | Admitting: *Deleted

## 2018-04-23 MED ORDER — VARENICLINE TARTRATE 1 MG PO TABS: 1.0000 mg | ORAL_TABLET | Freq: Two times a day (BID) | ORAL | 1 refills | Status: DC

## 2018-04-26 DIAGNOSIS — M533 Sacrococcygeal disorders, not elsewhere classified: Secondary | ICD-10-CM | POA: Diagnosis not present

## 2018-04-26 DIAGNOSIS — G894 Chronic pain syndrome: Secondary | ICD-10-CM | POA: Diagnosis not present

## 2018-04-26 DIAGNOSIS — M961 Postlaminectomy syndrome, not elsewhere classified: Secondary | ICD-10-CM | POA: Diagnosis not present

## 2018-04-26 DIAGNOSIS — M47816 Spondylosis without myelopathy or radiculopathy, lumbar region: Secondary | ICD-10-CM | POA: Diagnosis not present

## 2018-05-14 ENCOUNTER — Ambulatory Visit (INDEPENDENT_AMBULATORY_CARE_PROVIDER_SITE_OTHER): Payer: Medicare Other | Admitting: Family Medicine

## 2018-05-14 ENCOUNTER — Encounter: Payer: Self-pay | Admitting: Family Medicine

## 2018-05-14 VITALS — BP 111/60 | HR 79 | Temp 97.5°F | Ht 63.0 in | Wt 254.0 lb

## 2018-05-14 DIAGNOSIS — Z780 Asymptomatic menopausal state: Secondary | ICD-10-CM

## 2018-05-14 DIAGNOSIS — J449 Chronic obstructive pulmonary disease, unspecified: Secondary | ICD-10-CM

## 2018-05-14 DIAGNOSIS — I10 Essential (primary) hypertension: Secondary | ICD-10-CM | POA: Diagnosis not present

## 2018-05-14 DIAGNOSIS — R0602 Shortness of breath: Secondary | ICD-10-CM

## 2018-05-14 DIAGNOSIS — Z720 Tobacco use: Secondary | ICD-10-CM | POA: Diagnosis not present

## 2018-05-14 DIAGNOSIS — Z23 Encounter for immunization: Secondary | ICD-10-CM

## 2018-05-14 MED ORDER — NEBIVOLOL HCL 2.5 MG PO TABS: 2.5000 mg | ORAL_TABLET | Freq: Every day | ORAL | 11 refills | Status: DC

## 2018-05-14 NOTE — Progress Notes (Signed)
Subjective:    CC: BP  HPI:  Hypertension- Pt denies chest pain, SOB, dizziness, or heart palpitations.  Taking meds as directed w/o problems.  Denies medication side effects.    She is also here today to follow-up on snoring.  When she was here for her last visit in December she had mentioned that she snores and that sometimes when she wakes up in the morning her blood pressures actually run a little higher.  Her stop bang score was 4 she had never been tested for sleep apnea.  She was supposed to have some dental work in February and wanted to wait until afterwards to reassess the issue.  Follow up COPD-she denies any increased cough or shortness of breath or sputum production.  Tobacco abuse-unfortunately she has started to use smoke again.  She is smoking about a half a pack per day.  She wanted to know if she could have a refill on the Chantix prescription which has worked well for her.  She said when she is visiting her mom she does not smoke nearly as much because he just does not think about it but when she is at home her husband smokes a lot and it is just in front of her face and makes it difficult to not smoke.   Past medical history, Surgical history, Family history not pertinant except as noted below, Social history, Allergies, and medications have been entered into the medical record, reviewed, and corrections made.   Review of Systems: No fevers, chills, night sweats, weight loss, chest pain, or shortness of breath.   Objective:    General: Well Developed, well nourished, and in no acute distress.  Neuro: Alert and oriented x3, extra-ocular muscles intact, sensation grossly intact.  HEENT: Normocephalic, atraumatic  Skin: Warm and dry, no rashes. Cardiac: Regular rate and rhythm, no murmurs rubs or gallops, no lower extremity edema.  Respiratory: Clear to auscultation bilaterally. Not using accessory muscles, speaking in full sentences. Patient is breathing heavily after  alking down the hall.    Impression and Recommendations:   HTN - BP at goal. F/U in 3- 58months.  Labs are up to date.    COPD-pulse ox is down to 94% today.  I tried to talk her into letting us do a walk test but she declined and said she would consider it at the next office visit.  In the meantime we will work on smoking cessation.  Tobacco abuse-we will go ahead and refill the Chantix which has worked well for her.  Snoring - STOP BANG score of 4. seh is traveling soon and wants to hold off on sleep study for now.    Due for screening DEXA.   She is post menopausal. She is overdue for pap smear as well   Given pneumonia vaccine today.  She reports that she had her tetanus done about 2 years ago.  Flu vaccine done in December.

## 2018-06-26 DIAGNOSIS — M961 Postlaminectomy syndrome, not elsewhere classified: Secondary | ICD-10-CM | POA: Diagnosis not present

## 2018-06-26 DIAGNOSIS — M47816 Spondylosis without myelopathy or radiculopathy, lumbar region: Secondary | ICD-10-CM | POA: Diagnosis not present

## 2018-06-26 DIAGNOSIS — G894 Chronic pain syndrome: Secondary | ICD-10-CM | POA: Diagnosis not present

## 2018-06-26 DIAGNOSIS — M533 Sacrococcygeal disorders, not elsewhere classified: Secondary | ICD-10-CM | POA: Diagnosis not present

## 2018-06-27 ENCOUNTER — Other Ambulatory Visit: Payer: Self-pay

## 2018-06-27 DIAGNOSIS — J449 Chronic obstructive pulmonary disease, unspecified: Secondary | ICD-10-CM

## 2018-06-27 DIAGNOSIS — R0602 Shortness of breath: Secondary | ICD-10-CM

## 2018-06-27 MED ORDER — ALBUTEROL SULFATE HFA 108 (90 BASE) MCG/ACT IN AERS: 2.0000 | INHALATION_SPRAY | Freq: Four times a day (QID) | RESPIRATORY_TRACT | 3 refills | Status: DC | PRN

## 2018-08-02 DIAGNOSIS — M533 Sacrococcygeal disorders, not elsewhere classified: Secondary | ICD-10-CM | POA: Diagnosis not present

## 2018-08-02 DIAGNOSIS — G894 Chronic pain syndrome: Secondary | ICD-10-CM | POA: Diagnosis not present

## 2018-08-02 DIAGNOSIS — M47816 Spondylosis without myelopathy or radiculopathy, lumbar region: Secondary | ICD-10-CM | POA: Diagnosis not present

## 2018-08-02 DIAGNOSIS — M961 Postlaminectomy syndrome, not elsewhere classified: Secondary | ICD-10-CM | POA: Diagnosis not present

## 2018-08-21 ENCOUNTER — Other Ambulatory Visit: Payer: Self-pay | Admitting: Family Medicine

## 2018-08-22 ENCOUNTER — Ambulatory Visit (INDEPENDENT_AMBULATORY_CARE_PROVIDER_SITE_OTHER): Payer: Medicare Other | Admitting: Family Medicine

## 2018-08-22 ENCOUNTER — Encounter: Payer: Self-pay | Admitting: Family Medicine

## 2018-08-22 VITALS — BP 147/64 | HR 101 | Temp 97.9°F | Wt 261.0 lb

## 2018-08-22 DIAGNOSIS — I83019 Varicose veins of right lower extremity with ulcer of unspecified site: Secondary | ICD-10-CM

## 2018-08-22 DIAGNOSIS — I10 Essential (primary) hypertension: Secondary | ICD-10-CM | POA: Diagnosis not present

## 2018-08-22 DIAGNOSIS — L97919 Non-pressure chronic ulcer of unspecified part of right lower leg with unspecified severity: Secondary | ICD-10-CM | POA: Diagnosis not present

## 2018-08-22 DIAGNOSIS — J449 Chronic obstructive pulmonary disease, unspecified: Secondary | ICD-10-CM | POA: Diagnosis not present

## 2018-08-22 DIAGNOSIS — I872 Venous insufficiency (chronic) (peripheral): Secondary | ICD-10-CM | POA: Diagnosis not present

## 2018-08-22 DIAGNOSIS — I483 Typical atrial flutter: Secondary | ICD-10-CM

## 2018-08-22 DIAGNOSIS — R7301 Impaired fasting glucose: Secondary | ICD-10-CM

## 2018-08-22 NOTE — Patient Instructions (Addendum)
Thank you for coming in today. Get labs now. Recheck early next week. We will remove the Unna boot then. Return sooner if needed.   Venous Ulcer  A venous ulcer is a shallow sore on your lower leg that is caused by poor circulation in your veins. This condition used to be called stasis ulcer. Veins have valves that help return blood to the heart. If these valves do not work properly, it can cause blood to flow backward and to back up into the veins near the skin. When that happens, blood can pool in your lower legs. The blood can then leak out of your veins, which can irritate your skin. This may cause a break in your skin that becomes a venous ulcer. Venous ulcer is the most common type of lower leg ulcer. You may have venous ulcers on one leg or on both legs. The area where this condition most commonly develops is around the ankles. A venous ulcer may last for a long time (chronic ulcer) or it may return repeatedly (recurrent ulcer). What are the causes? Any condition that causes poor circulation to your legs can lead to a venous ulcer. What increases the risk? This condition is more likely to develop in:  People who are 64 years of age or older.  People who are overweight.  People who are not active.  People who have had a leg ulcer in the past.  People who have clots in their lower leg veins (deep vein thrombosis).  People who have inflammation of their leg veins (phlebitis).  Women who have given birth.  People who smoke. What are the signs or symptoms? The most common symptom of this condition is an open sore near your ankle. Other symptoms may include:  Swelling.  Thickening of the skin.  Fluid leaking from the ulcer.  Bleeding.  Itching.  Pain and swelling that gets worse when you stand up and feels better when you raise your leg.  Blotchy skin.  Darkening of the skin. How is this diagnosed? Your health care provider may suspect a venous ulcer based on your  medical history and your risk factors. Your health care provider will check the skin on your legs. Other tests may be done to learn more about the ulcer and to determine the best way to treat it. Tests that may be done include:  Measuring the blood pressure in your arms and legs.  Using sound waves (ultrasound) to measure the blood flow in your leg veins. How is this treated? You may need to try several different types of treatment to get your venous ulcer to heal. Healing may take a long time. Treatment may include:  Keeping your leg raised (elevated).  Wearing a type of bandage or stocking to compress the veins of your leg (compression therapy). Venous wounds are not likely to heal or to stay healed without compression.  Taking medicines to improve blood flow.  Taking antibiotic medicines to treat infection.  Cleaning your ulcer and removing any dead tissue from the wound (debridement).  Placing various types of medicated bandage (dressings) or medicated wraps on your ulcer. This helps the ulcer to heal and helps to prevent infection. Surgery is sometimes needed to close the wound using a piece of skin taken from another area of your body (graft). You may need surgery if other treatments are not working or if your ulcer is very deep. Follow these instructions at home: Wound care  Follow instructions from your health care provider  about: ? How to take care of your wound. ? When and how you should change your bandage (dressing). ? When you should remove your dressing. If your dressing is dry and sticks to your leg when you try to remove it, moisten or wet the dressing with saline solution or water so that the dressing can be removed without harming your skin or wound tissue.  Check your wound every day for signs of infection. Have a caregiver do this for you if you are not able to do it yourself. Check for: ? More redness, swelling, or pain. ? More fluid or blood. ? Pus, warmth, or a  bad smell. Medicines  Take over-the-counter and prescription medicines only as told by your health care provider.  If you were prescribed an antibiotic medicine, take it or apply it as told by your health care provider. Do not stop taking or using the antibiotic even if your condition improves. Activity  Do not stand or sit in one position for a long period of time. Rest with your legs raised during the day. If possible, keep your legs above your heart for 30 minutes, 3-4 times a day, or as told by your health care provider.  Do not sit with your legs crossed.  Walk often to increase the blood flow in your legs.Ask your health care provider what level of activity is safe for you.  If you are taking a long ride in a car or plane, take a break to walk around at least once every two hours, or as often as your health care provider recommends. Ask your health care provider if you should take aspirin before long trips. General instructions   Wear elastic stockings, compression stockings, or support hose as told by your health care provider. This is very important.  Raise the foot of your bed as told by your health care provider.  Do not smoke.  Keep all follow-up visits as told by your health care provider. This is important. Contact a health care provider if:  You have a fever.  Your ulcer is getting larger or is not healing.  Your pain gets worse.  You have more redness or swelling around your ulcer.  You have more fluid, blood, or pus coming from your ulcer after it has been cleaned by you or your health care provider.  You have warmth or a bad smell coming from your ulcer. This information is not intended to replace advice given to you by your health care provider. Make sure you discuss any questions you have with your health care provider. Document Released: 11/23/2000 Document Revised: 11/23/2016 Document Reviewed: 07/09/2014 Elsevier Interactive Patient Education  2019  [place] An Louretta [place] boot is a type of bandage (dressing) for the foot and leg. The dressing is a gauze wrap that is soaked with a type of medicine called zinc oxide. The gauze may also include other lotions and medicines that help in wound healing, such as calamine. An Unna boot may be used to treat:  Open sores (ulcers) on the foot, heel, or leg.  Swelling from disorders that affect the veins or lymphatic system (lymphedema).  Skin conditions such as chronic inflammation caused by poor blood flow (stasis dermatitis). The dressing is applied by a health care provider. The gauze is wrapped around your lower extremity in several layers, usually starting at the toes and going upward to the knee. A dry outer wrap goes over the medicated wrap for  support and compression.  Before applying the [employer], your health care provider will clean your leg and foot and may apply an antibiotic ointment. You may be asked to raise (elevate) your leg for a while to reduce swelling before the boot is applied. The boot will dry and harden after it is applied. The boot may need to be changed or replaced about twice a week. Follow these instructions at home: [place] as told by your health care provider.  You may need to wear a slipper or shoe over the boot that is one or two sizes larger than normal.  Check the skin around the boot every day. Tell your health care provider about any concerns.  Do not stick anything inside the boot to scratch your skin. Doing that increases your risk of infection.  Keep your [employer] clean and dry.  Check every day for signs of infection. Check for: ? Redness, swelling, or pain in your foot or toes. ? Fluid or blood coming from the boot. ? Pus or a bad smell coming from the boot.  Remove the boot and call your health care provider if you have signs of poor blood flow, such as: ? Your toes tingle or become numb. ? Your toes turn  cold or turn blue or pale. ? Your toes are more swollen or painful. ? You are unable to move your toes. Activity  You may walk with the boot once it has dried. Ask your health care provider how much walking is safe for you.  Avoid sitting for a long time without moving. Get up to take short walks as told by your health care provider. This is important to improve blood flow. Bathing  Do not take baths, swim, or use a hot tub until your health care provider approves. Ask your health care provider if you may take showers.  If your health care provider approves a bath or a shower, do not let the Unna boot get wet. ? If you take a shower, cover the boot with a watertight covering. ? If you take a bath, keep your leg with the boot out of the tub. General instructions  Keep your leg elevated above the level of your heart while you are sitting or lying down. This will decrease swelling.  Do not sit with your knee bent for long periods of time.  Take over-the-counter and prescription medicines only as told by your health care provider.  Do not use any products that contain nicotine or tobacco, such as cigarettes, e-cigarettes, and chewing tobacco. These can delay healing. If you need help quitting, ask your health care provider.  Keep all follow-up visits as told by your health care provider. This is important. Contact a health care provider if:  Your skin feels itchy inside the boot.  You have a burning sensation, a rash, or itchy, red, swollen areas of skin (hives) in the boot area.  You have a fever or chills.  You have any signs of infection, such as: ? New redness, swelling, or pain. ? More fluid or blood coming from the boot. ? Pus or a bad smell coming from the boot.  You have increased numbness or pain in your foot or toes.  You have any changes in skin color on your foot or toes, such as the skin turning blue or pale or developing patchy areas with spots.  Your boot has  been damaged or feels like it is no  longer fitting properly. Summary  An Louretta Parma boot is a type of bandage (dressing) system for the foot and leg.  The dressing is a gauze wrap that is soaked with a type of medicine (zinc oxide) to treat foot, heel, or leg ulcers, swelling from disorders that affect the veins or lymphatic system (lymphedema), and skin conditions caused by poor blood flow (stasis dermatitis).  This dressing is applied by a health care provider. After it is applied, the boot will dry and harden.  The boot may need to be changed or replaced about twice a week.  Let your health care provider know if you have any signs of poor blood flow or infection. This information is not intended to replace advice given to you by your health care provider. Make sure you discuss any questions you have with your health care provider. Document Released: 11/08/2017 Document Revised: 11/08/2017 Document Reviewed: 11/08/2017 Elsevier Interactive Patient Education  2019 [employer].

## 2018-08-22 NOTE — Progress Notes (Signed)
Kelly Roth is a 68 y.o. female who presents to [facility] today for right leg swelling and blisters.   Kelly Roth developed weeping swelling in her right leg over the last 3 weeks.  Is worsened over the last week when she drove across the country from [place] back to [place].  She used adhesive bandage to try to control some of the weeping and as result developed skin breakdown and ulcerations on her right calf.  She notes that it is not painful.  She denies any fevers or chills.  She typically has leg swelling and sometimes can use compression stockings but finds them difficult to get on.  She denies chest pain palpitation shortness of breath.  She currently takes Xarelto for atrial fibrillation flutter  She lives part-time in [place] where she cares for her elderly mother.  She plans on returning back to [place] on June 19 and returning on July 17   Patient has a pertinent medical history for hypertension atrial flutter COPD and morbid obesity.  She additionally has had peripheral edema previously and has a current prescription for Lasix 80 mg twice daily as needed for swelling.  She had an echocardiogram in 2019 showing EF of 55 to 60% with a small pericardial effusion.  ROS as above:  Exam:  BP (!) 147/64   Pulse (!) 101   Temp 97.9 F (36.6 C) (Oral)   Wt 261 lb (118.4 kg)   BMI 46.23 kg/m  Wt Readings from Last 5 Encounters:  08/22/18 261 lb (118.4 kg)  05/14/18 254 lb (115.2 kg)  02/27/18 269 lb (122 kg)  11/15/16 270 lb (122.5 kg)  11/07/16 265 lb (120.2 kg)    Gen: Well NAD HEENT: EOMI,  MMM Lungs: Normal work of breathing. CTABL Heart: RRR no MRG Abd: NABS, Soft. Nondistended, Nontender Exts: Right leg erythematous with edema and venous ulcers present.  Skin is nontender. Left leg: Similar calf diameter skin is also somewhat  erythematous with no ulceration.  Nonpitting edema present.     Lab and Radiology Results No results found for this or any previous visit (from the past 72 hour(s)). No results found.    Assessment and Plan: 68 y.o. female with right leg venous stasis ulcer and venous stasis dermatitis.  Doubtful for cellulitis given no pain.  Additionally patient does not have fever.  Plan for metabolic work-up listed below.  Additionally will treat with Unna boot and recheck in less than 1 week.  This will give [place] a few days to respond on recheck before she needs to leave for [place] again.  Additionally will check LDL and A1c for hyperglycemia and hyperlipidemia.  Recheck sooner if needed.  PDMP not reviewed this encounter. Orders Placed This Encounter  Procedures  . CBC with Differential/Platelet  . COMPLETE METABOLIC PANEL WITH GFR  . Hemoglobin A1c  . TSH  . LDL cholesterol, direct   No orders of the defined types were placed in this encounter.    Historical information moved to improve visibility of documentation.  Past Medical History:  Diagnosis Date  . Adenomatous polyps 2004    Colonoscopy done in [facility]  . Arthritis   . Back pain   . Complication of anesthesia   . COPD (chronic obstructive pulmonary disease) ([place])   . Dyspnea    pain contributes but also pt. reveals that she has been out of her Spiriva for one month +  .  GERD (gastroesophageal reflux disease)   . Hemorrhoids   . Hyperlipidemia   . Hypertension   . Osteoarthritis   . PONV (postoperative nausea and vomiting)    scopolamine patch used with success with a prev. surg.    Past Surgical History:  Procedure Laterality Date  . CHOLECYSTECTOMY  05-08-07  . JOINT REPLACEMENT Bilateral   . left knee replacement  3-07   right on 2-06  . LUMBAR LAMINECTOMY/DECOMPRESSION MICRODISCECTOMY N/A 03/01/2016   Procedure: LEFT AND CENTRAL L3-4 AND LEFT L4-5 DECOMPRESSIVE LUMBAR LAMINECTOMY FOR SPINAL STENOSIS;   Surgeon: Jessy Oto, MD;  Location: [facility];  Service: Orthopedics;  Laterality: N/A;  . MULTIPLE TOOTH EXTRACTIONS     Social History   Tobacco Use  . Smoking status: Current Every Day Smoker    Packs/day: 1.00    Types: Cigarettes  . Smokeless tobacco: Never Used  . Tobacco comment: very rare use, she remarks "I had one cigarette today, reports thia is the first one in a month"  Substance Use Topics  . Alcohol use: Yes    Alcohol/week: 0.0 standard drinks    Comment: one drink a week    family history includes COPD in her mother; Depression in her mother; Hyperlipidemia in her father and mother; Hypertension in her father and mother; Lung cancer in her mother.  Medications: Current Outpatient Medications  Medication Sig Dispense Refill  . albuterol (PROVENTIL HFA;VENTOLIN HFA) 108 (90 Base) MCG/ACT inhaler Inhale 2 puffs into the lungs every 6 (six) hours as needed for wheezing or shortness of breath. 18 g 3  . AMBULATORY NON FORMULARY MEDICATION Medication Name: Order for overnight pulse oximetry. Patient has moderate COPD. She did not qualify for daytime oxygen but concerned that she may be having drops in oxygen at night. Fax to [facility] 1 Units 0  . DULoxetine (CYMBALTA) 60 MG capsule Take 60 mg by mouth daily.    . fish oil-omega-3 fatty acids 1000 MG capsule Take 1 g by mouth daily.     . furosemide (LASIX) 40 MG tablet TAKE 2 TABLET TWICE A DAY AS NEEDED LEG SWELLING 30 tablet 1  . HYDROcodone-acetaminophen (NORCO) 7.5-325 MG tablet Take 1-2 tablets by mouth every 4 (four) hours as needed for moderate pain. 60 tablet 0  . lisinopril-hydrochlorothiazide (PRINZIDE,ZESTORETIC) 20-12.5 MG tablet Take 1 tablet by mouth daily. 90 tablet 1  . methocarbamol (ROBAXIN) 500 MG tablet Take 1 tablet (500 mg total) by mouth every 8 (eight) hours as needed for muscle spasms. 90 tablet 1  . metoprolol succinate (TOPROL-XL) 25 MG 24 hr tablet Take 25 mg by mouth daily.    . Multiple Vitamin  (MULTIVITAMIN) capsule Take 1 capsule by mouth daily.      . naloxone (NARCAN) nasal spray 4 mg/0.1 mL Place into the nose.    . nebivolol (BYSTOLIC) 2.5 MG tablet Take 1 tablet (2.5 mg total) by mouth daily. 30 tablet 11  . piroxicam (FELDENE) 10 MG capsule Take 1 capsule (10 mg total) by mouth 2 (two) times daily. APPT FOR FURTHER REFILLS 180 capsule 0  . rivaroxaban (XARELTO) 20 MG TABS tablet Take 20 mg by mouth daily.    . rosuvastatin (CRESTOR) 40 MG tablet Take 1 tablet (40 mg total) by mouth daily. 90 tablet 3  . umeclidinium-vilanterol (ANORO ELLIPTA) 62.5-25 MCG/INH AEPB Inhale 1 puff into the lungs daily. 60 each 6  . varenicline (CHANTIX) 1 MG tablet Take 1 tablet (1 mg total) by mouth 2 (two) times  daily. 180 tablet 1   No current facility-administered medications for this visit.    No Known Allergies   Discussed warning signs or symptoms. Please see discharge instructions. Patient expresses understanding.

## 2018-08-23 ENCOUNTER — Other Ambulatory Visit: Payer: Self-pay | Admitting: Family Medicine

## 2018-08-23 LAB — COMPLETE METABOLIC PANEL WITH GFR
AG Ratio: 1.7 (calc) (ref 1.0–2.5)
ALT: 27 U/L (ref 6–29)
AST: 20 U/L (ref 10–35)
Albumin: 3.8 g/dL (ref 3.6–5.1)
Alkaline phosphatase (APISO): 100 U/L (ref 37–153)
BUN: 11 mg/dL (ref 7–25)
CO2: 31 mmol/L (ref 20–32)
Calcium: 10.1 mg/dL (ref 8.6–10.4)
Chloride: 98 mmol/L (ref 98–110)
Creat: 0.66 mg/dL (ref 0.50–0.99)
GFR, Est African American: 107 mL/min/{1.73_m2} (ref >=60)
GFR, Est Non African American: 93 mL/min/{1.73_m2} (ref >=60)
Globulin: 2.3 g/dL (calc) (ref 1.9–3.7)
Glucose, Bld: 117 mg/dL — ABNORMAL HIGH (ref 65–99)
Potassium: 3.7 mmol/L (ref 3.5–5.3)
Sodium: 139 mmol/L (ref 135–146)
Total Bilirubin: 1 mg/dL (ref 0.2–1.2)
Total Protein: 6.1 g/dL (ref 6.1–8.1)

## 2018-08-23 LAB — CBC WITH DIFFERENTIAL/PLATELET
Absolute Monocytes: 643 cells/uL (ref 200–950)
Basophils Absolute: 58 cells/uL (ref 0–200)
Basophils Relative: 0.6 %
Eosinophils Absolute: 173 cells/uL (ref 15–500)
Eosinophils Relative: 1.8 %
HCT: 43.2 % (ref 35.0–45.0)
Hemoglobin: 14 g/dL (ref 11.7–15.5)
Lymphs Abs: 1344 cells/uL (ref 850–3900)
MCH: 30.7 pg (ref 27.0–33.0)
MCHC: 32.4 g/dL (ref 32.0–36.0)
MCV: 94.7 fL (ref 80.0–100.0)
MPV: 10.9 fL (ref 7.5–12.5)
Monocytes Relative: 6.7 %
Neutro Abs: 7382 cells/uL (ref 1500–7800)
Neutrophils Relative %: 76.9 %
Platelets: 189 10*3/uL (ref 140–400)
RBC: 4.56 10*6/uL (ref 3.80–5.10)
RDW: 15.5 % — ABNORMAL HIGH (ref 11.0–15.0)
Total Lymphocyte: 14 %
WBC: 9.6 10*3/uL (ref 3.8–10.8)

## 2018-08-23 LAB — TSH: TSH: 1.75 mIU/L (ref 0.40–4.50)

## 2018-08-23 LAB — HEMOGLOBIN A1C
Hgb A1c MFr Bld: 5.8 % of total Hgb — ABNORMAL HIGH (ref <5.7)
Mean Plasma Glucose: 120 (calc)
eAG (mmol/L): 6.6 (calc)

## 2018-08-23 LAB — LDL CHOLESTEROL, DIRECT: Direct LDL: 58 mg/dL (ref <100)

## 2018-08-28 ENCOUNTER — Ambulatory Visit (INDEPENDENT_AMBULATORY_CARE_PROVIDER_SITE_OTHER): Payer: Medicare Other | Admitting: Family Medicine

## 2018-08-28 ENCOUNTER — Encounter: Payer: Self-pay | Admitting: Family Medicine

## 2018-08-28 VITALS — BP 132/65 | HR 91 | Temp 98.1°F | Wt 266.0 lb

## 2018-08-28 DIAGNOSIS — L97919 Non-pressure chronic ulcer of unspecified part of right lower leg with unspecified severity: Secondary | ICD-10-CM | POA: Diagnosis not present

## 2018-08-28 DIAGNOSIS — I872 Venous insufficiency (chronic) (peripheral): Secondary | ICD-10-CM

## 2018-08-28 DIAGNOSIS — I83019 Varicose veins of right lower extremity with ulcer of unspecified site: Secondary | ICD-10-CM

## 2018-08-28 NOTE — Patient Instructions (Addendum)
Thank you for coming in today. We will try to refer to wound care in [place]. [facility] Phone: [phone]  Ideally will try to get an appointment in something like 2 weeks.   Remove the Unna boot in 1 week.  Clean the wound but be gentle   Ok to send pictures to my email at [email or web address] but sending through mychart is best.   We should schedule to follow up with Dr Madilyn Fireman when you return after July 14th.

## 2018-08-28 NOTE — Progress Notes (Signed)
Kelly Roth is a 68 y.o. female who presents to [facility] today for follow-up leg swelling.  Patient was seen on June 10 for leg swelling and wound thought to be venous stasis ulcer and venous stasis dermatitis.  She was treated with an Haematologist.  She is here today for recheck and reevaluation.  She notes that she is traveling back to [place] to take care of her elderly mother at the end of the week.  Additionally labs were obtained to follow-up hyperglycemia and hyperlipidemia as well as metabolic panel.   She notes that she is doing quite well.  She denies any significant calf pain  Will return from [place] on July 14th.  ROS as above:  Exam:  BP 132/65   Pulse 91   Temp 98.1 F (36.7 C) (Oral)   Wt 266 lb (120.7 kg)   BMI 47.12 kg/m   Wt Readings from Last 5 Encounters:  08/28/18 266 lb (120.7 kg)  08/22/18 261 lb (118.4 kg)  05/14/18 254 lb (115.2 kg)  02/27/18 269 lb (122 kg)  11/15/16 270 lb (122.5 kg)    Gen: Well NAD HEENT: EOMI,  MMM Lungs: Normal work of breathing. CTABL Heart: RRR no MRG Abd: NABS, Soft. Nondistended, Nontender Exts: Calf diameter is equal bilaterally.  Significantly decreased appearance of ulceration.  Erythema is also decreased overall.  Nontender no bleeding no oozing.  Lab and Radiology Results Recent Results (from the past 2160 hour(s))  CBC with Differential/Platelet     Status: Abnormal   Collection Time: 08/22/18  2:15 PM  Result Value Ref Range   WBC 9.6 3.8 - 10.8 Thousand/uL   RBC 4.56 3.80 - 5.10 Million/uL   Hemoglobin 14.0 11.7 - 15.5 g/dL   HCT 43.2 35.0 - 45.0 %   MCV 94.7 80.0 - 100.0 fL   MCH 30.7 27.0 - 33.0 pg   MCHC 32.4 32.0 - 36.0 g/dL   RDW 15.5 (H) 11.0 - 15.0 %   Platelets 189 140 - 400 Thousand/uL   MPV 10.9 7.5 - 12.5 fL   Neutro Abs 7,382 1,500 - 7,800 cells/uL   Lymphs Abs  1,344 850 - 3,900 cells/uL   Absolute Monocytes 643 200 - 950 cells/uL   Eosinophils Absolute 173 15 - 500 cells/uL   Basophils Absolute 58 0 - 200 cells/uL   Neutrophils Relative % 76.9 %   Total Lymphocyte 14.0 %   Monocytes Relative 6.7 %   Eosinophils Relative 1.8 %   Basophils Relative 0.6 %  COMPLETE METABOLIC PANEL WITH GFR     Status: Abnormal   Collection Time: 08/22/18  2:15 PM  Result Value Ref Range   Glucose, Bld 117 (H) 65 - 99 mg/dL    Comment: .            Fasting reference interval . For someone without known diabetes, a glucose value between 100 and 125 mg/dL is consistent with prediabetes and should be confirmed with a follow-up test. .    BUN 11 7 - 25 mg/dL   Creat 0.66 0.50 - 0.99 mg/dL    Comment: For patients >32 years of age, the reference limit for Creatinine is approximately 13% higher for people identified as African-American. .    GFR, Est Non African American 93 > OR = 60 mL/min/1.43m2   GFR, Est African American 107 > OR = 60 mL/min/1.37m2   BUN/Creatinine Ratio NOT APPLICABLE 6 -  22 (calc)   Sodium 139 135 - 146 mmol/L   Potassium 3.7 3.5 - 5.3 mmol/L   Chloride 98 98 - 110 mmol/L   CO2 31 20 - 32 mmol/L   Calcium 10.1 8.6 - 10.4 mg/dL   Total Protein 6.1 6.1 - 8.1 g/dL   Albumin 3.8 3.6 - 5.1 g/dL   Globulin 2.3 1.9 - 3.7 g/dL (calc)   AG Ratio 1.7 1.0 - 2.5 (calc)   Total Bilirubin 1.0 0.2 - 1.2 mg/dL   Alkaline phosphatase (APISO) 100 37 - 153 U/L   AST 20 10 - 35 U/L   ALT 27 6 - 29 U/L  Hemoglobin A1c     Status: Abnormal   Collection Time: 08/22/18  2:15 PM  Result Value Ref Range   Hgb A1c MFr Bld 5.8 (H) <5.7 % of total Hgb    Comment: For someone without known diabetes, a hemoglobin  A1c value between 5.7% and 6.4% is consistent with prediabetes and should be confirmed with a  follow-up test. . For someone with known diabetes, a value <7% indicates that their diabetes is well controlled. A1c targets should be  individualized based on duration of diabetes, age, comorbid conditions, and other considerations. . This assay result is consistent with an increased risk of diabetes. . Currently, no consensus exists regarding use of hemoglobin A1c for diagnosis of diabetes for children. .    Mean Plasma Glucose 120 (calc)   eAG (mmol/L) 6.6 (calc)  TSH     Status: None   Collection Time: 08/22/18  2:15 PM  Result Value Ref Range   TSH 1.75 0.40 - 4.50 mIU/L  LDL cholesterol, direct     Status: None   Collection Time: 08/22/18  2:15 PM  Result Value Ref Range   Direct LDL 58 <100 mg/dL    Comment: Greatly elevated Triglycerides values (>1200 mg/dL) interfere with the dLDL assay. As no Triglycerides  testing was ordered, interpret results with caution. . Desirable range <100 mg/dL for primary prevention;   <70 mg/dL for patients with CHD or diabetic patients  with > or = 2 CHD risk factors. .       Assessment and Plan: 68 y.o. female with  Right calf venous stasis ulcer and venous stasis dermatitis improving after 1 week of Unna boot.  Plan to replace Unna boot.  Patient is confident that she can remove it in 1 week on her own.  Unfortunately in 1 week she is going to be in [place] for about a month.  Her access to care is obviously going to be somewhat limited.  I am going to try to refer to wound management in [place].  There is a wound care center near where she is going to be in [facility].  Patient can take pictures of her wounds and send them to me via my chart as well. Follow up with PCP upon return to [facility] after July 14.  Precautions reviewed.  PDMP not reviewed this encounter. Orders Placed This Encounter  Procedures  . AMB referral to wound care center    Referral Priority:   Routine    Referral Type:   Consultation    Number of Visits Requested:   1   No orders of the defined types were placed in this encounter.    Historical  information moved to improve visibility of documentation.  Past Medical History:  Diagnosis Date  . Adenomatous polyps 2004    Colonoscopy done in  [place]  . Arthritis   . Back pain   . Complication of anesthesia   . COPD (chronic obstructive pulmonary disease) ([place])   . Dyspnea    pain contributes but also pt. reveals that she has been out of her Spiriva for one month +  . GERD (gastroesophageal reflux disease)   . Hemorrhoids   . Hyperlipidemia   . Hypertension   . Osteoarthritis   . PONV (postoperative nausea and vomiting)    scopolamine patch used with success with a prev. surg.    Past Surgical History:  Procedure Laterality Date  . CHOLECYSTECTOMY  05-08-07  . JOINT REPLACEMENT Bilateral   . left knee replacement  3-07   right on 2-06  . LUMBAR LAMINECTOMY/DECOMPRESSION MICRODISCECTOMY N/A 03/01/2016   Procedure: LEFT AND CENTRAL L3-4 AND LEFT L4-5 DECOMPRESSIVE LUMBAR LAMINECTOMY FOR SPINAL STENOSIS;  Surgeon: Jessy Oto, MD;  Location: [facility];  Service: Orthopedics;  Laterality: N/A;  . MULTIPLE TOOTH EXTRACTIONS     Social History   Tobacco Use  . Smoking status: Current Every Day Smoker    Packs/day: 1.00    Types: Cigarettes  . Smokeless tobacco: Never Used  . Tobacco comment: very rare use, she remarks "I had one cigarette today, reports thia is the first one in a month"  Substance Use Topics  . Alcohol use: Yes    Alcohol/week: 0.0 standard drinks    Comment: one drink a week    family history includes COPD in her mother; Depression in her mother; Hyperlipidemia in her father and mother; Hypertension in her father and mother; Lung cancer in her mother.  Medications: Current Outpatient Medications  Medication Sig Dispense Refill  . albuterol (PROVENTIL HFA;VENTOLIN HFA) 108 (90 Base) MCG/ACT inhaler Inhale 2 puffs into the lungs every 6 (six) hours as needed for wheezing or shortness of breath. 18 g 3  . AMBULATORY NON FORMULARY MEDICATION Medication  Name: Order for overnight pulse oximetry. Patient has moderate COPD. She did not qualify for daytime oxygen but concerned that she may be having drops in oxygen at night. Fax to [facility] 1 Units 0  . DULoxetine (CYMBALTA) 60 MG capsule Take 60 mg by mouth daily.    . fish oil-omega-3 fatty acids 1000 MG capsule Take 1 g by mouth daily.     . furosemide (LASIX) 40 MG tablet TAKE 2 TABLET TWICE A DAY AS NEEDED LEG SWELLING 30 tablet 1  . HYDROcodone-acetaminophen (NORCO) 7.5-325 MG tablet Take 1-2 tablets by mouth every 4 (four) hours as needed for moderate pain. 60 tablet 0  . lisinopril-hydrochlorothiazide (ZESTORETIC) 20-12.5 MG tablet TAKE 1 TABLET BY MOUTH EVERY DAY 90 tablet 0  . methocarbamol (ROBAXIN) 500 MG tablet Take 1 tablet (500 mg total) by mouth every 8 (eight) hours as needed for muscle spasms. 90 tablet 1  . metoprolol succinate (TOPROL-XL) 25 MG 24 hr tablet Take 25 mg by mouth daily.    . Multiple Vitamin (MULTIVITAMIN) capsule Take 1 capsule by mouth daily.      . naloxone (NARCAN) nasal spray 4 mg/0.1 mL Place into the nose.    . nebivolol (BYSTOLIC) 2.5 MG tablet Take 1 tablet (2.5 mg total) by mouth daily. 30 tablet 11  . piroxicam (FELDENE) 10 MG capsule Take 1 capsule (10 mg total) by mouth 2 (two) times daily. APPT FOR FURTHER REFILLS 180 capsule 0  . rivaroxaban (XARELTO) 20 MG TABS tablet Take 20 mg by mouth daily.    . rosuvastatin (  CRESTOR) 40 MG tablet Take 1 tablet (40 mg total) by mouth daily. 90 tablet 3  . umeclidinium-vilanterol (ANORO ELLIPTA) 62.5-25 MCG/INH AEPB Inhale 1 puff into the lungs daily. 60 each 6  . varenicline (CHANTIX) 1 MG tablet Take 1 tablet (1 mg total) by mouth 2 (two) times daily. 180 tablet 1   No current facility-administered medications for this visit.    No Known Allergies   Discussed warning signs or symptoms. Please see discharge instructions. Patient expresses understanding.

## 2018-08-29 DIAGNOSIS — M47816 Spondylosis without myelopathy or radiculopathy, lumbar region: Secondary | ICD-10-CM | POA: Diagnosis not present

## 2018-08-29 DIAGNOSIS — M533 Sacrococcygeal disorders, not elsewhere classified: Secondary | ICD-10-CM | POA: Diagnosis not present

## 2018-08-29 DIAGNOSIS — M961 Postlaminectomy syndrome, not elsewhere classified: Secondary | ICD-10-CM | POA: Diagnosis not present

## 2018-08-29 DIAGNOSIS — G894 Chronic pain syndrome: Secondary | ICD-10-CM | POA: Diagnosis not present

## 2018-09-12 ENCOUNTER — Other Ambulatory Visit: Payer: Self-pay | Admitting: Family Medicine

## 2018-09-27 DIAGNOSIS — M961 Postlaminectomy syndrome, not elsewhere classified: Secondary | ICD-10-CM | POA: Diagnosis not present

## 2018-09-27 DIAGNOSIS — G894 Chronic pain syndrome: Secondary | ICD-10-CM | POA: Diagnosis not present

## 2018-09-27 DIAGNOSIS — M47816 Spondylosis without myelopathy or radiculopathy, lumbar region: Secondary | ICD-10-CM | POA: Diagnosis not present

## 2018-09-27 DIAGNOSIS — M533 Sacrococcygeal disorders, not elsewhere classified: Secondary | ICD-10-CM | POA: Diagnosis not present

## 2018-10-09 ENCOUNTER — Other Ambulatory Visit: Payer: Self-pay | Admitting: Family Medicine

## 2018-10-19 ENCOUNTER — Other Ambulatory Visit: Payer: Self-pay | Admitting: Family Medicine

## 2018-10-26 DIAGNOSIS — M47816 Spondylosis without myelopathy or radiculopathy, lumbar region: Secondary | ICD-10-CM | POA: Diagnosis not present

## 2018-10-26 DIAGNOSIS — M961 Postlaminectomy syndrome, not elsewhere classified: Secondary | ICD-10-CM | POA: Diagnosis not present

## 2018-10-26 DIAGNOSIS — G894 Chronic pain syndrome: Secondary | ICD-10-CM | POA: Diagnosis not present

## 2018-10-26 DIAGNOSIS — M533 Sacrococcygeal disorders, not elsewhere classified: Secondary | ICD-10-CM | POA: Diagnosis not present

## 2018-11-26 ENCOUNTER — Encounter: Payer: Self-pay | Admitting: Family Medicine

## 2018-11-26 ENCOUNTER — Ambulatory Visit (INDEPENDENT_AMBULATORY_CARE_PROVIDER_SITE_OTHER): Payer: Medicare Other

## 2018-11-26 ENCOUNTER — Ambulatory Visit: Payer: Medicare Other | Admitting: Family Medicine

## 2018-11-26 ENCOUNTER — Ambulatory Visit (INDEPENDENT_AMBULATORY_CARE_PROVIDER_SITE_OTHER): Payer: Medicare Other | Admitting: Family Medicine

## 2018-11-26 ENCOUNTER — Other Ambulatory Visit: Payer: Self-pay

## 2018-11-26 VITALS — BP 110/80 | HR 111 | Temp 98.3°F | Ht 63.0 in | Wt 248.0 lb

## 2018-11-26 DIAGNOSIS — J449 Chronic obstructive pulmonary disease, unspecified: Secondary | ICD-10-CM

## 2018-11-26 DIAGNOSIS — R0602 Shortness of breath: Secondary | ICD-10-CM

## 2018-11-26 DIAGNOSIS — I4891 Unspecified atrial fibrillation: Secondary | ICD-10-CM | POA: Insufficient documentation

## 2018-11-26 DIAGNOSIS — R0902 Hypoxemia: Secondary | ICD-10-CM

## 2018-11-26 DIAGNOSIS — M7918 Myalgia, other site: Secondary | ICD-10-CM | POA: Diagnosis not present

## 2018-11-26 DIAGNOSIS — Z5181 Encounter for therapeutic drug level monitoring: Secondary | ICD-10-CM | POA: Diagnosis not present

## 2018-11-26 DIAGNOSIS — S76312A Strain of muscle, fascia and tendon of the posterior muscle group at thigh level, left thigh, initial encounter: Secondary | ICD-10-CM

## 2018-11-26 DIAGNOSIS — R000 Tachycardia, unspecified: Secondary | ICD-10-CM

## 2018-11-26 DIAGNOSIS — Z79899 Other long term (current) drug therapy: Secondary | ICD-10-CM | POA: Diagnosis not present

## 2018-11-26 MED ORDER — ALBUTEROL SULFATE HFA 108 (90 BASE) MCG/ACT IN AERS: 2.0000 | INHALATION_SPRAY | Freq: Four times a day (QID) | RESPIRATORY_TRACT | 3 refills | Status: DC | PRN

## 2018-11-26 NOTE — Progress Notes (Addendum)
Acute Office Visit  Subjective:    Patient ID: Kelly Roth, female    DOB: 16-Sep-1950, 68 y.o.   MRN: [id number]  Chief Complaint  Patient presents with  . Follow-up    HPI Patient is in today for buttock pain x 1 week in the left side. No injury or trauma.  Says is worse when she sits up or stands up or when she tries to sit down.  It is better with inactivity.  She did try Aleve and it did not help.  She has not tried heat or ice.  She said she was walking and out shopping with her mother in [place] maybe about 4 weeks ago when she did feel a pulling sensation.  She is also SOB today.  When she walked in her initial pulse ox was 88%.  shehas felt more SOB x 2 months.  She denies any new onset cough or increased sputum production.  She continues to smoke daily.  She does have a prior history of atrial flutter and actually had a cardiac ablation in October 2019.  She says even when she had that she did not feel short of breath.  She denies any recent chest pain.  No fever, chills or sweats. She is already on Xarelto.     Past Medical History:  Diagnosis Date  . Adenomatous polyps 2004    Colonoscopy done in [facility]  . Arthritis   . Back pain   . Complication of anesthesia   . COPD (chronic obstructive pulmonary disease) ([place])   . Dyspnea    pain contributes but also pt. reveals that she has been out of her Spiriva for one month +  . GERD (gastroesophageal reflux disease)   . Hemorrhoids   . Hyperlipidemia   . Hypertension   . Osteoarthritis   . PONV (postoperative nausea and vomiting)    scopolamine patch used with success with a prev. surg.     Past Surgical History:  Procedure Laterality Date  . CHOLECYSTECTOMY  05-08-07  . JOINT REPLACEMENT Bilateral   . left knee replacement  3-07   right on 2-06  . LUMBAR LAMINECTOMY/DECOMPRESSION MICRODISCECTOMY N/A 03/01/2016   Procedure: LEFT AND CENTRAL L3-4 AND LEFT L4-5 DECOMPRESSIVE LUMBAR LAMINECTOMY FOR  SPINAL STENOSIS;  Surgeon: Jessy Oto, MD;  Location: [facility];  Service: Orthopedics;  Laterality: N/A;  . MULTIPLE TOOTH EXTRACTIONS      Family History  Problem Relation Age of Onset  . Depression Mother   . Hypertension Mother   . Hyperlipidemia Mother   . COPD Mother   . Lung cancer Mother   . Hyperlipidemia Father   . Hypertension Father   . Colon cancer Neg Hx        Neg     Social History   Socioeconomic History  . Marital status: Married    Spouse name: Not on file  . Number of children: Not on file  . Years of education: Not on file  . Highest education level: Not on file  Occupational History  . Not on file  Social Needs  . Financial resource strain: Not on file  . Food insecurity    Worry: Not on file    Inability: Not on file  . Transportation needs    Medical: Not on file    Non-medical: Not on file  Tobacco Use  . Smoking status: Current Every Day Smoker    Packs/day: 1.00    Types: Cigarettes  .  Smokeless tobacco: Never Used  . Tobacco comment: very rare use, she remarks "I had one cigarette today, reports thia is the first one in a month"  Substance and Sexual Activity  . Alcohol use: Yes    Alcohol/week: 0.0 standard drinks    Comment: one drink a week   . Drug use: No  . Sexual activity: Not on file  Lifestyle  . Physical activity    Days per week: Not on file    Minutes per session: Not on file  . Stress: Not on file  Relationships  . Social Herbalist on phone: Not on file    Gets together: Not on file    Attends religious service: Not on file    Active member of club or organization: Not on file    Attends meetings of clubs or organizations: Not on file    Relationship status: Not on file  . Intimate partner violence    Fear of current or ex partner: Not on file    Emotionally abused: Not on file    Physically abused: Not on file    Forced sexual activity: Not on file  Other Topics Concern  . Not on file  Social  History Narrative  . Not on file    Outpatient Medications Prior to Visit  Medication Sig Dispense Refill  . AMBULATORY NON FORMULARY MEDICATION Medication Name: Order for overnight pulse oximetry. Patient has moderate COPD. She did not qualify for daytime oxygen but concerned that she may be having drops in oxygen at night. Fax to [facility] 1 Units 0  . ANORO ELLIPTA 62.5-25 MCG/INH AEPB TAKE 1 PUFF BY MOUTH EVERY DAY 60 each 6  . DULoxetine (CYMBALTA) 60 MG capsule Take 60 mg by mouth daily.    . fish oil-omega-3 fatty acids 1000 MG capsule Take 1 g by mouth daily.     . furosemide (LASIX) 40 MG tablet TAKE 2 TABLET TWICE A DAY AS NEEDED LEG SWELLING 30 tablet 1  . HYDROcodone-acetaminophen (NORCO) 7.5-325 MG tablet Take 1-2 tablets by mouth every 4 (four) hours as needed for moderate pain. 60 tablet 0  . lisinopril-hydrochlorothiazide (ZESTORETIC) 20-12.5 MG tablet TAKE 1 TABLET BY MOUTH DAILY. PLEASE SCHEDULE AN APPOINTMENT FOR ADDITIONAL REFILLS 90 tablet 0  . methocarbamol (ROBAXIN) 500 MG tablet Take 1 tablet (500 mg total) by mouth every 8 (eight) hours as needed for muscle spasms. 90 tablet 1  . metoprolol succinate (TOPROL-XL) 25 MG 24 hr tablet Take 25 mg by mouth daily.    . Multiple Vitamin (MULTIVITAMIN) capsule Take 1 capsule by mouth daily.      . nebivolol (BYSTOLIC) 2.5 MG tablet Take 1 tablet (2.5 mg total) by mouth daily. 30 tablet 11  . piroxicam (FELDENE) 10 MG capsule TAKE 1 CAPSULE (10 MG TOTAL) BY MOUTH 2 (TWO) TIMES DAILY. NEED APPOINTMENT FOR FURTHER REFILLS 180 capsule 0  . rivaroxaban (XARELTO) 20 MG TABS tablet Take 20 mg by mouth daily.    . varenicline (CHANTIX) 1 MG tablet Take 1 tablet (1 mg total) by mouth 2 (two) times daily. 180 tablet 1  . albuterol (PROVENTIL HFA;VENTOLIN HFA) 108 (90 Base) MCG/ACT inhaler Inhale 2 puffs into the lungs every 6 (six) hours as needed for wheezing or shortness of breath. 18 g 3  . naloxone (NARCAN) nasal spray 4 mg/0.1 mL Place  into the nose.    . rosuvastatin (CRESTOR) 40 MG tablet Take 1 tablet (40 mg total) by mouth  daily. (Patient not taking: Reported on 11/26/2018) 90 tablet 3   No facility-administered medications prior to visit.     No Known Allergies  ROS     Objective:    Physical Exam  Constitutional: She is oriented to person, place, and time. She appears well-developed and well-nourished.  HENT:  Head: Normocephalic and atraumatic.  Cardiovascular: Normal rate, regular rhythm and normal heart sounds.  Irregular rhythm.  Pulmonary/Chest: Effort normal and breath sounds normal.  Prolonged expiration.  Musculoskeletal:     Comments: Left hip with normal flexion and extension.  No significant pain with internal or external rotation.  She did have discomfort with trying to stand up and with sitting back down.  Nontender over the ischio spines.  Neurological: She is alert and oriented to person, place, and time.  Skin: Skin is warm and dry.  Psychiatric: She has a normal mood and affect. Her behavior is normal.    BP 110/80   Pulse (!) 111   Temp 98.3 F (36.8 C) (Oral)   Ht 5\' 3"  (1.6 m)   Wt 248 lb (112.5 kg)   SpO2 92%   BMI 43.93 kg/m  Wt Readings from Last 3 Encounters:  11/26/18 248 lb (112.5 kg)  08/28/18 266 lb (120.7 kg)  08/22/18 261 lb (118.4 kg)    Health Maintenance Due  Topic Date Due  . PAP SMEAR-Modifier  09/12/2014  . COLONOSCOPY  02/20/2018  . DEXA SCAN  03/02/2018  . MAMMOGRAM  09/02/2018    There are no preventive care reminders to display for this patient.   Lab Results  Component Value Date   TSH 1.75 08/22/2018   Lab Results  Component Value Date   WBC 9.6 08/22/2018   HGB 14.0 08/22/2018   HCT 43.2 08/22/2018   MCV 94.7 08/22/2018   PLT 189 08/22/2018   Lab Results  Component Value Date   NA 139 08/22/2018   K 3.7 08/22/2018   CO2 31 08/22/2018   GLUCOSE 117 (H) 08/22/2018   BUN 11 08/22/2018   CREATININE 0.66 08/22/2018   BILITOT 1.0  08/22/2018   ALKPHOS 95 08/22/2016   AST 20 08/22/2018   ALT 27 08/22/2018   PROT 6.1 08/22/2018   ALBUMIN 4.3 08/22/2016   CALCIUM 10.1 08/22/2018   ANIONGAP 7 03/02/2016   Lab Results  Component Value Date   CHOL 127 03/12/2018   Lab Results  Component Value Date   HDL 37 (L) 03/12/2018   Lab Results  Component Value Date   LDLCALC 71 03/12/2018   Lab Results  Component Value Date   TRIG 106 03/12/2018   Lab Results  Component Value Date   CHOLHDL 3.4 03/12/2018   Lab Results  Component Value Date   HGBA1C 5.8 (H) 08/22/2018       Assessment & Plan:   Problem List Items Addressed This Visit      Cardiovascular and Mediastinum   Atrial fibrillation ([place])    Unfortunately she is back in A. fib after an ablation in October of last year.  Working to get a try to get her back in with cardiology ASAP. Likely cause of her in SOB. She is on Xarelto      Relevant Orders   Ambulatory referral to Cardiology     Respiratory   Chronic obstructive pulmonary disease (HCC)   Relevant Medications   albuterol (VENTOLIN HFA) 108 (90 Base) MCG/ACT inhaler    Other Visit Diagnoses    SOB (shortness of  breath)    -  Primary   Relevant Orders   DG Chest 2 View (Completed)   CBC with Differential/Platelet   Hypoxic       Relevant Orders   DG Chest 2 View (Completed)   CBC with Differential/Platelet   Left buttock pain       Relevant Orders   DG Chest 2 View (Completed)   CBC with Differential/Platelet   Hamstring strain, left, initial encounter       Relevant Orders   DG Chest 2 View (Completed)   CBC with Differential/Platelet   Tachycardia       Relevant Orders   EKG 12-Lead (Completed)   Shortness of breath       Relevant Medications   albuterol (VENTOLIN HFA) 108 (90 Base) MCG/ACT inhaler     Shortness of breath x2 months-unclear etiology we will get chest x-ray today for further work-up.  It may also just be worsening of underlying COPD.  She did become  hypoxic after walking but after sitting and resting for about a minute they returned into acceptable range.  We will also check for anemia.  She just had a TSH and CMP performed in June approximately 3 months ago.  Left hamstring strain-given exercises to do on her own at home to rehab the area if not improving please let [place] know.  Should be traveling back to [place] in about 2 weeks.  Tachycardia - HR is up today.  She is in afib she is on Xarelto.    EKG today shows rate of 102 bpm, atrial fibrillation with abnormal P waves.  Low voltage QRS as well.  Meds ordered this encounter  Medications  . albuterol (VENTOLIN HFA) 108 (90 Base) MCG/ACT inhaler    Sig: Inhale 2 puffs into the lungs every 6 (six) hours as needed for wheezing or shortness of breath.    Dispense:  18 g    Refill:  3     Beatrice Lecher, MD

## 2018-11-26 NOTE — Patient Instructions (Addendum)
Hamstring Strain  A hamstring strain happens when the muscles in the back of the thighs (hamstring muscles) are overstretched or torn. The hamstring muscles are used in straightening the hips, bending the knees, and pulling back the legs. This injury is often called a pulled hamstring muscle. The tissue that connects the muscle to a bone (tendon) may also be affected. The severity of a hamstring strain may be rated in degrees or grades. First-degree (or grade 1) strains have the least amount of muscle tearing and pain. Second-degree and third-degree (grade 2 and 3) strains have increasingly more tearing and pain. What are the causes? This condition is caused by a sudden, violent force being placed on the hamstring muscles, stretching them too far. This often happens during activities that involve running, jumping, kicking, or weight lifting. What increases the risk? Hamstring strains are especially common in athletes. The following factors may also make you more likely to develop this condition:  Having low strength, endurance, or flexibility of the hamstring muscles.  Doing high-impact physical activity or sports.  Having poor physical fitness.  Having a previous leg injury.  Having tired (fatigued) muscles. What are the signs or symptoms? Symptoms of this condition include:  Pain in the back of the thigh.  Swelling.  Bruising.  Muscle spasms.  Trouble moving the affected muscle because of pain. For severe strains, you may feel popping or snapping in the back of your thigh when the injury occurs. How is this diagnosed? This condition is diagnosed based on your symptoms, your medical history, and a physical exam. How is this treated? Treatment for this condition usually involves:  Protecting, resting, icing, applying compression, and elevating the injured area (PRICE therapy).  Medicines. Your health care provider may recommend medicines to help reduce pain or  inflammation.  Doing exercises to regain strength and flexibility in the muscles. Your health care provider will tell you when it is okay to begin exercising. Follow these instructions at home: PRICE therapy Use PRICE therapy to promote muscle healing during the first 2-3 days after your injury, or as told by your health care provider.  Protect the muscle from being injured again.  Rest your injury. This usually involves limiting your normal activities and not using the injured hamstring muscle. Talk with your health care provider about how you should limit your activities.  Apply ice to the injured area: ? Put ice in a plastic bag. ? Place a towel between your skin and the bag. ? Leave the ice on for 20 minutes, 2-3 times a day. After the third day, switch to applying heat as told.  Put pressure (compression) on your injured hamstring by wrapping it with an elastic bandage. Be careful not to wrap it too tightly. That may interfere with blood circulation or may increase swelling.  Raise (elevate) your injured hamstring above the level of your heart as often as possible. When you are lying down, you can do this by putting a pillow under your thigh.  Activity  Begin exercising or stretching only as told by your health care provider.  Do not return to full activity level until your health care provider approves.  To help prevent muscle strains in the future, always warm up before exercising and stretch afterward. General instructions  Take over-the-counter and prescription medicines only as told by your health care provider.  If directed, apply heat to the affected area as often as told by your health care provider. Use the heat source that your   health care provider recommends, such as a moist heat pack or a heating pad. ? Place a towel between your skin and the heat source. ? Leave the heat on for 20-30 minutes. ? Remove the heat if your skin turns bright red. This is especially  important if you are unable to feel pain, heat, or cold. You may have a greater risk of getting burned.  Keep all follow-up visits as told by your health care provider. This is important. Contact a health care provider if you have:  Increasing pain or swelling in the injured area.  Numbness, tingling, or a significant loss of strength in the injured area. Get help right away if:  Your foot or your toes become cold or turn blue. Summary  A hamstring strain happens when the muscles in the back of the thighs (hamstring muscles) are overstretched or torn.  This injury can be caused by a sudden, violent force being placed on the hamstring muscles, causing them to stretch too far.  Symptoms include pain, swelling, and muscle spasms in the injured area.  Treatment includes what is called PRICE therapy: protecting, resting, icing, applying compression, and elevating the injured area. This information is not intended to replace advice given to you by your health care provider. Make sure you discuss any questions you have with your health care provider. Document Released: 11/23/2000 Document Revised: 02/10/2017 Document Reviewed: 01/26/2017 Elsevier Patient Education  2020 [employer]  

## 2018-11-26 NOTE — Assessment & Plan Note (Addendum)
Unfortunately she is back in A. fib after an ablation in October of last year.  Working to get a try to get her back in with cardiology ASAP. Likely cause of her in SOB. She is on Xarelto

## 2018-11-26 NOTE — Addendum Note (Signed)
Addended by: Beatrice Lecher D on: 11/26/2018 04:52 PM   Modules accepted: Orders

## 2018-11-27 ENCOUNTER — Ambulatory Visit: Payer: Medicare Other | Admitting: Family Medicine

## 2018-11-27 DIAGNOSIS — M533 Sacrococcygeal disorders, not elsewhere classified: Secondary | ICD-10-CM | POA: Diagnosis not present

## 2018-11-27 DIAGNOSIS — M47816 Spondylosis without myelopathy or radiculopathy, lumbar region: Secondary | ICD-10-CM | POA: Diagnosis not present

## 2018-11-27 DIAGNOSIS — G894 Chronic pain syndrome: Secondary | ICD-10-CM | POA: Diagnosis not present

## 2018-11-27 DIAGNOSIS — Z23 Encounter for immunization: Secondary | ICD-10-CM | POA: Diagnosis not present

## 2018-11-27 DIAGNOSIS — M961 Postlaminectomy syndrome, not elsewhere classified: Secondary | ICD-10-CM | POA: Diagnosis not present

## 2018-12-03 DIAGNOSIS — I483 Typical atrial flutter: Secondary | ICD-10-CM | POA: Diagnosis not present

## 2018-12-06 ENCOUNTER — Encounter: Payer: Self-pay | Admitting: Family Medicine

## 2018-12-06 ENCOUNTER — Other Ambulatory Visit: Payer: Self-pay

## 2018-12-06 ENCOUNTER — Ambulatory Visit (INDEPENDENT_AMBULATORY_CARE_PROVIDER_SITE_OTHER): Payer: Medicare Other | Admitting: Family Medicine

## 2018-12-06 DIAGNOSIS — J449 Chronic obstructive pulmonary disease, unspecified: Secondary | ICD-10-CM | POA: Diagnosis not present

## 2018-12-06 DIAGNOSIS — I483 Typical atrial flutter: Secondary | ICD-10-CM

## 2018-12-06 DIAGNOSIS — S76312A Strain of muscle, fascia and tendon of the posterior muscle group at thigh level, left thigh, initial encounter: Secondary | ICD-10-CM | POA: Diagnosis not present

## 2018-12-06 DIAGNOSIS — G8929 Other chronic pain: Secondary | ICD-10-CM

## 2018-12-06 NOTE — Assessment & Plan Note (Signed)
He plans on scheduling an ablation in December when she gets back from [place].  She often goes for couple months at a time to help take care of her mother.  Right now she actually feels pretty good she still gets short of breath with activities but does not have any chest pain or swelling or lightheadedness.

## 2018-12-06 NOTE — Assessment & Plan Note (Signed)
Discussed options.  I be happy to take over her medication regimen in December she can make an appointment at that time and we can take over everything and get her on a new pain contract etc.  Did encourage her to let [facility] no around the time of the transition just to make sure that they know that we are transitioning and that if in the future we need for them to consult that we will.

## 2018-12-06 NOTE — Assessment & Plan Note (Signed)
Even though she is more short of breath with activities she has not had any increase in cough or sputum reduction.  Just encouraged her to keep an eye on this and let me know if she starts having any problems.

## 2018-12-06 NOTE — Progress Notes (Signed)
Virtual Visit via Telephone Note  I connected with Theda Belfast on 12/06/18 at  9:30 AM EDT by telephone and verified that I am speaking with the correct person using two identifiers.   I discussed the limitations, risks, security and privacy concerns of performing an evaluation and management service by telephone and the availability of in person appointments. I also discussed with the patient that there may be a patient responsible charge related to this service. The patient expressed understanding and agreed to proceed.   Established Patient Office Visit  Subjective:  Patient ID: Kelly Roth, female    DOB: 06/02/1950  Age: 68 y.o. MRN: [id number]  CC: No chief complaint on file.   HPI Kelly Roth presents for follow-up atrial fibrillation.  When I saw her about a week and a half ago she was feeling more short of breath and her heart rate was up.  Unfortunately she was back in A. fib after having had an ablation last fall in October 2019.  We were able to get her back in with cardiology at [facility].  She saw Dr. Cori Razor.  She does have a history of LV dysfunction.  When she was seen by cardiology she was actually in atrial flutter.  We discussed possible cardioversion again.  In regards to her hamstring/buttock pain on the left side actually has been getting gradually better she is been doing the stretches that I gave her but now she is trying to get some pain in the right side in the same area.  She also wanted to discuss her chronic pain management.  She is currently following with [facility] and is currently on hydrocodone 7.5 mg every 8 hours.  She gets 90 tabs per month.  She just saw them for a virtual visit and a urine drug screen.  She is due for her next follow-up in early December.    Past Medical History:  Diagnosis Date  . Adenomatous polyps 2004    Colonoscopy done in [facility]  . Arthritis   . Back pain   . Complication of  anesthesia   . COPD (chronic obstructive pulmonary disease) ([place])   . Dyspnea    pain contributes but also pt. reveals that she has been out of her Spiriva for one month +  . GERD (gastroesophageal reflux disease)   . Hemorrhoids   . Hyperlipidemia   . Hypertension   . Osteoarthritis   . PONV (postoperative nausea and vomiting)    scopolamine patch used with success with a prev. surg.     Past Surgical History:  Procedure Laterality Date  . CHOLECYSTECTOMY  05-08-07  . JOINT REPLACEMENT Bilateral   . left knee replacement  3-07   right on 2-06  . LUMBAR LAMINECTOMY/DECOMPRESSION MICRODISCECTOMY N/A 03/01/2016   Procedure: LEFT AND CENTRAL L3-4 AND LEFT L4-5 DECOMPRESSIVE LUMBAR LAMINECTOMY FOR SPINAL STENOSIS;  Surgeon: Jessy Oto, MD;  Location: [place];  Service: Orthopedics;  Laterality: N/A;  . MULTIPLE TOOTH EXTRACTIONS      Family History  Problem Relation Age of Onset  . Depression Mother   . Hypertension Mother   . Hyperlipidemia Mother   . COPD Mother   . Lung cancer Mother   . Hyperlipidemia Father   . Hypertension Father   . Colon cancer Neg Hx        Neg     Social History   Socioeconomic History  . Marital status: Married    Spouse name:  Not on file  . Number of children: Not on file  . Years of education: Not on file  . Highest education level: Not on file  Occupational History  . Not on file  Social Needs  . Financial resource strain: Not on file  . Food insecurity    Worry: Not on file    Inability: Not on file  . Transportation needs    Medical: Not on file    Non-medical: Not on file  Tobacco Use  . Smoking status: Current Every Day Smoker    Packs/day: 1.00    Types: Cigarettes  . Smokeless tobacco: Never Used  . Tobacco comment: very rare use, she remarks "I had one cigarette today, reports thia is the first one in a month"  Substance and Sexual Activity  . Alcohol use: Yes    Alcohol/week: 0.0 standard drinks    Comment: one drink a  week   . Drug use: No  . Sexual activity: Not on file  Lifestyle  . Physical activity    Days per week: Not on file    Minutes per session: Not on file  . Stress: Not on file  Relationships  . Social Herbalist on phone: Not on file    Gets together: Not on file    Attends religious service: Not on file    Active member of club or organization: Not on file    Attends meetings of clubs or organizations: Not on file    Relationship status: Not on file  . Intimate partner violence    Fear of current or ex partner: Not on file    Emotionally abused: Not on file    Physically abused: Not on file    Forced sexual activity: Not on file  Other Topics Concern  . Not on file  Social History Narrative  . Not on file    Outpatient Medications Prior to Visit  Medication Sig Dispense Refill  . albuterol (VENTOLIN HFA) 108 (90 Base) MCG/ACT inhaler Inhale 2 puffs into the lungs every 6 (six) hours as needed for wheezing or shortness of breath. 18 g 3  . AMBULATORY NON FORMULARY MEDICATION Medication Name: Order for overnight pulse oximetry. Patient has moderate COPD. She did not qualify for daytime oxygen but concerned that she may be having drops in oxygen at night. Fax to [facility] 1 Units 0  . ANORO ELLIPTA 62.5-25 MCG/INH AEPB TAKE 1 PUFF BY MOUTH EVERY DAY 60 each 6  . DULoxetine (CYMBALTA) 60 MG capsule Take 60 mg by mouth daily.    . furosemide (LASIX) 40 MG tablet TAKE 2 TABLET TWICE A DAY AS NEEDED LEG SWELLING 30 tablet 1  . HYDROcodone-acetaminophen (NORCO) 7.5-325 MG tablet Take 1-2 tablets by mouth every 4 (four) hours as needed for moderate pain. 60 tablet 0  . lisinopril-hydrochlorothiazide (ZESTORETIC) 20-12.5 MG tablet TAKE 1 TABLET BY MOUTH DAILY. PLEASE SCHEDULE AN APPOINTMENT FOR ADDITIONAL REFILLS 90 tablet 0  . methocarbamol (ROBAXIN) 500 MG tablet Take 1 tablet (500 mg total) by mouth every 8 (eight) hours as needed for muscle spasms. 90 tablet 1  . metoprolol  succinate (TOPROL-XL) 25 MG 24 hr tablet Take 25 mg by mouth daily.    . Multiple Vitamin (MULTIVITAMIN) capsule Take 1 capsule by mouth daily.      . piroxicam (FELDENE) 10 MG capsule TAKE 1 CAPSULE (10 MG TOTAL) BY MOUTH 2 (TWO) TIMES DAILY. NEED APPOINTMENT FOR FURTHER REFILLS 180 capsule 0  .  rivaroxaban (XARELTO) 20 MG TABS tablet Take 20 mg by mouth daily.    . nebivolol (BYSTOLIC) 2.5 MG tablet Take 1 tablet (2.5 mg total) by mouth daily. 30 tablet 11  . varenicline (CHANTIX) 1 MG tablet Take 1 tablet (1 mg total) by mouth 2 (two) times daily. 180 tablet 1  . fish oil-omega-3 fatty acids 1000 MG capsule Take 1 g by mouth daily.      No facility-administered medications prior to visit.     No Known Allergies  ROS Review of Systems    Objective:    Physical Exam  There were no vitals taken for this visit. Wt Readings from Last 3 Encounters:  11/26/18 248 lb (112.5 kg)  08/28/18 266 lb (120.7 kg)  08/22/18 261 lb (118.4 kg)     Health Maintenance Due  Topic Date Due  . DEXA SCAN  03/02/2018    There are no preventive care reminders to display for this patient.  Lab Results  Component Value Date   TSH 1.75 08/22/2018   Lab Results  Component Value Date   WBC 9.6 08/22/2018   HGB 14.0 08/22/2018   HCT 43.2 08/22/2018   MCV 94.7 08/22/2018   PLT 189 08/22/2018   Lab Results  Component Value Date   NA 139 08/22/2018   K 3.7 08/22/2018   CO2 31 08/22/2018   GLUCOSE 117 (H) 08/22/2018   BUN 11 08/22/2018   CREATININE 0.66 08/22/2018   BILITOT 1.0 08/22/2018   ALKPHOS 95 08/22/2016   AST 20 08/22/2018   ALT 27 08/22/2018   PROT 6.1 08/22/2018   ALBUMIN 4.3 08/22/2016   CALCIUM 10.1 08/22/2018   ANIONGAP 7 03/02/2016   Lab Results  Component Value Date   CHOL 127 03/12/2018   Lab Results  Component Value Date   HDL 37 (L) 03/12/2018   Lab Results  Component Value Date   LDLCALC 71 03/12/2018   Lab Results  Component Value Date   TRIG 106  03/12/2018   Lab Results  Component Value Date   CHOLHDL 3.4 03/12/2018   Lab Results  Component Value Date   HGBA1C 5.8 (H) 08/22/2018      Assessment & Plan:   Problem List Items Addressed This Visit      Cardiovascular and Mediastinum   Typical atrial flutter [facility])    He plans on scheduling an ablation in December when she gets back from [place].  She often goes for couple months at a time to help take care of her mother.  Right now she actually feels pretty good she still gets short of breath with activities but does not have any chest pain or swelling or lightheadedness.        Respiratory   Chronic obstructive pulmonary disease ([place])    Even though she is more short of breath with activities she has not had any increase in cough or sputum reduction.  Just encouraged her to keep an eye on this and let me know if she starts having any problems.        Other   Encounter for chronic pain management    Discussed options.  I be happy to take over her medication regimen in December she can make an appointment at that time and we can take over everything and get her on a new pain contract etc.  Did encourage her to let [facility] no around the time of the transition just to make sure that they know that  we are transitioning and that if in the future we need for them to consult that we will.       Other Visit Diagnoses    Hamstring strain, left, initial encounter    -  Primary     Hamstring strain-sounds like it is improving on the left but now symptomatic on the right just encouraged her to do the exercises bilaterally.  If she still having problems by the time she returns in December we will get her in with sports medicine for further work-up.  No orders of the defined types were placed in this encounter.   Follow-up: Return in about 10 weeks (around 02/14/2019) for pain mgt.      I discussed the assessment and treatment plan with the patient. The patient  was provided an opportunity to ask questions and all were answered. The patient agreed with the plan and demonstrated an understanding of the instructions.   The patient was advised to call back or seek an in-person evaluation if the symptoms worsen or if the condition fails to improve as anticipated.  I provided 25 minutes of non-face-to-face time during this encounter.   Beatrice Lecher, MD

## 2019-01-08 DIAGNOSIS — M961 Postlaminectomy syndrome, not elsewhere classified: Secondary | ICD-10-CM | POA: Diagnosis not present

## 2019-01-08 DIAGNOSIS — M47816 Spondylosis without myelopathy or radiculopathy, lumbar region: Secondary | ICD-10-CM | POA: Diagnosis not present

## 2019-01-08 DIAGNOSIS — M533 Sacrococcygeal disorders, not elsewhere classified: Secondary | ICD-10-CM | POA: Diagnosis not present

## 2019-01-08 DIAGNOSIS — G894 Chronic pain syndrome: Secondary | ICD-10-CM | POA: Diagnosis not present

## 2019-02-14 ENCOUNTER — Ambulatory Visit: Payer: Medicare Other | Admitting: Family Medicine

## 2019-02-19 ENCOUNTER — Encounter: Payer: Self-pay | Admitting: Family Medicine

## 2019-02-19 ENCOUNTER — Other Ambulatory Visit: Payer: Self-pay

## 2019-02-19 ENCOUNTER — Ambulatory Visit (INDEPENDENT_AMBULATORY_CARE_PROVIDER_SITE_OTHER): Payer: Medicare Other | Admitting: Family Medicine

## 2019-02-19 VITALS — BP 119/63 | HR 89 | Ht 63.0 in | Wt 232.0 lb

## 2019-02-19 DIAGNOSIS — Z720 Tobacco use: Secondary | ICD-10-CM | POA: Diagnosis not present

## 2019-02-19 DIAGNOSIS — M25552 Pain in left hip: Secondary | ICD-10-CM

## 2019-02-19 DIAGNOSIS — M25551 Pain in right hip: Secondary | ICD-10-CM | POA: Diagnosis not present

## 2019-02-19 DIAGNOSIS — G8929 Other chronic pain: Secondary | ICD-10-CM

## 2019-02-19 MED ORDER — HYDROCODONE-ACETAMINOPHEN 7.5-325 MG PO TABS: 1.0000 | ORAL_TABLET | Freq: Three times a day (TID) | ORAL | 0 refills | Status: DC | PRN

## 2019-02-19 MED ORDER — PIROXICAM 10 MG PO CAPS: 10.0000 mg | ORAL_CAPSULE | Freq: Two times a day (BID) | ORAL | 0 refills | Status: DC

## 2019-02-19 NOTE — Progress Notes (Signed)
Established Patient Office Visit  Subjective:  Patient ID: Kelly Roth, female    DOB: [birth year]  Age: 68 y.o. MRN: [id number]  CC:  Chief Complaint  Patient presents with  . pain management    HPI Kelly Roth presents for encounter for chronic pain management for spinal stenosis and chronic back pain.  She also complains of bilateral hip pain and stiffness and pain in the knees as well..  When I saw her in September she had wanted to transition her chronic pain care back to [place] she had been following with [facility] for the last several years.  We discussed coming back and in her current pain appointment was due for follow-up.  She is already provided a letter to her pain management doctor letting them know that she is transferring care to [place].  We can always consult again if needed. Additional medications for pain control: Piroxicam, Robaxin, Cymbalta.  History of lumbar laminectomy with decompression and microdiscectomy December 2017.   currently on hydrocodone 7.5/325 mg every 8 hours.  She gets 90 tabs per month.  She did bring in her empty pill bottle today.  She typically follows with pain management every 2 months but is asking to be followed every 3 months.  She travels a lot to [place] to help take care of her mother and is usually gone for several weeks to up to a month at a time.  She has seen Dr. Louanne Skye, orthopedic surgeon in the past.  Tobacco abuse she still smokes 5 to 10 cigarettes/day.  Unfortunately she started smoking again she had quit for short period of time.  She was unable to get the Chantix covered with her insurance plans on contacting the pharmacy again.  Atrial flutter-scheduled in December to have an ablation done.  Past Medical History:  Diagnosis Date  . Adenomatous polyps 2004    Colonoscopy done in [facility]  . Arthritis   . Back pain   . Complication of anesthesia   . COPD (chronic obstructive pulmonary disease)  ([place])   . Dyspnea    pain contributes but also pt. reveals that she has been out of her Spiriva for one month +  . GERD (gastroesophageal reflux disease)   . Hemorrhoids   . Hyperlipidemia   . Hypertension   . Osteoarthritis   . PONV (postoperative nausea and vomiting)    scopolamine patch used with success with a prev. surg.     Past Surgical History:  Procedure Laterality Date  . CHOLECYSTECTOMY  05-08-07  . JOINT REPLACEMENT Bilateral   . left knee replacement  3-07   right on 2-06  . LUMBAR LAMINECTOMY/DECOMPRESSION MICRODISCECTOMY N/A 03/01/2016   Procedure: LEFT AND CENTRAL L3-4 AND LEFT L4-5 DECOMPRESSIVE LUMBAR LAMINECTOMY FOR SPINAL STENOSIS;  Surgeon: Jessy Oto, MD;  Location: [facility];  Service: Orthopedics;  Laterality: N/A;  . MULTIPLE TOOTH EXTRACTIONS      Family History  Problem Relation Age of Onset  . Depression Mother   . Hypertension Mother   . Hyperlipidemia Mother   . COPD Mother   . Lung cancer Mother   . Hyperlipidemia Father   . Hypertension Father   . Colon cancer Neg Hx        Neg     Social History   Socioeconomic History  . Marital status: Married    Spouse name: Not on file  . Number of children: Not on file  . Years of education: Not on  file  . Highest education level: Not on file  Occupational History  . Not on file  Social Needs  . Financial resource strain: Not on file  . Food insecurity    Worry: Not on file    Inability: Not on file  . Transportation needs    Medical: Not on file    Non-medical: Not on file  Tobacco Use  . Smoking status: Current Every Day Smoker    Packs/day: 1.00    Types: Cigarettes  . Smokeless tobacco: Never Used  . Tobacco comment: very rare use, she remarks "I had one cigarette today, reports thia is the first one in a month"  Substance and Sexual Activity  . Alcohol use: Yes    Alcohol/week: 0.0 standard drinks    Comment: one drink a week   . Drug use: No  . Sexual activity: Not on file   Lifestyle  . Physical activity    Days per week: Not on file    Minutes per session: Not on file  . Stress: Not on file  Relationships  . Social Herbalist on phone: Not on file    Gets together: Not on file    Attends religious service: Not on file    Active member of club or organization: Not on file    Attends meetings of clubs or organizations: Not on file    Relationship status: Not on file  . Intimate partner violence    Fear of current or ex partner: Not on file    Emotionally abused: Not on file    Physically abused: Not on file    Forced sexual activity: Not on file  Other Topics Concern  . Not on file  Social History Narrative  . Not on file    Outpatient Medications Prior to Visit  Medication Sig Dispense Refill  . albuterol (VENTOLIN HFA) 108 (90 Base) MCG/ACT inhaler Inhale 2 puffs into the lungs every 6 (six) hours as needed for wheezing or shortness of breath. 18 g 3  . AMBULATORY NON FORMULARY MEDICATION Medication Name: Order for overnight pulse oximetry. Patient has moderate COPD. She did not qualify for daytime oxygen but concerned that she may be having drops in oxygen at night. Fax to [facility] 1 Units 0  . ANORO ELLIPTA 62.5-25 MCG/INH AEPB TAKE 1 PUFF BY MOUTH EVERY DAY 60 each 6  . DULoxetine (CYMBALTA) 60 MG capsule Take 60 mg by mouth daily.    . furosemide (LASIX) 40 MG tablet TAKE 2 TABLET TWICE A DAY AS NEEDED LEG SWELLING 30 tablet 1  . lisinopril-hydrochlorothiazide (ZESTORETIC) 20-12.5 MG tablet TAKE 1 TABLET BY MOUTH DAILY. PLEASE SCHEDULE AN APPOINTMENT FOR ADDITIONAL REFILLS 90 tablet 0  . methocarbamol (ROBAXIN) 500 MG tablet Take 1 tablet (500 mg total) by mouth every 8 (eight) hours as needed for muscle spasms. 90 tablet 1  . metoprolol succinate (TOPROL-XL) 25 MG 24 hr tablet Take 25 mg by mouth daily.    . Multiple Vitamin (MULTIVITAMIN) capsule Take 1 capsule by mouth daily.      . nebivolol (BYSTOLIC) 2.5 MG tablet Take 1 tablet  (2.5 mg total) by mouth daily. 30 tablet 11  . rivaroxaban (XARELTO) 20 MG TABS tablet Take 20 mg by mouth daily.    Marland Kitchen HYDROcodone-acetaminophen (NORCO) 7.5-325 MG tablet Take 1-2 tablets by mouth every 4 (four) hours as needed for moderate pain. 60 tablet 0  . piroxicam (FELDENE) 10 MG capsule TAKE 1 CAPSULE (10  MG TOTAL) BY MOUTH 2 (TWO) TIMES DAILY. NEED APPOINTMENT FOR FURTHER REFILLS 180 capsule 0  . varenicline (CHANTIX) 1 MG tablet Take 1 tablet (1 mg total) by mouth 2 (two) times daily. 180 tablet 1   No facility-administered medications prior to visit.     No Known Allergies  ROS Review of Systems    Objective:    Physical Exam  Constitutional: She is oriented to person, place, and time. She appears well-developed and well-nourished.  HENT:  Head: Normocephalic and atraumatic.  Cardiovascular: Normal rate, regular rhythm and normal heart sounds.  Pulmonary/Chest: Effort normal and breath sounds normal.  Musculoskeletal:     Comments: Nontender over the lumbar spine.  Old surgical incision well-healed.  Tender over the SI joints.  Hip, knee, ankle strength is 5-5 bilaterally.  Patellar reflexes 0+ bilaterally.  Negative straight leg raise.  Neurological: She is alert and oriented to person, place, and time.  Skin: Skin is warm and dry.  Psychiatric: She has a normal mood and affect. Her behavior is normal.    BP 119/63   Pulse 89   Ht 5\' 3"  (1.6 m)   Wt 232 lb (105.2 kg)   SpO2 95%   BMI 41.10 kg/m  Wt Readings from Last 3 Encounters:  02/19/19 232 lb (105.2 kg)  11/26/18 248 lb (112.5 kg)  08/28/18 266 lb (120.7 kg)     Health Maintenance Due  Topic Date Due  . DEXA SCAN  03/02/2018    There are no preventive care reminders to display for this patient.  Lab Results  Component Value Date   TSH 1.75 08/22/2018   Lab Results  Component Value Date   WBC 9.6 08/22/2018   HGB 14.0 08/22/2018   HCT 43.2 08/22/2018   MCV 94.7 08/22/2018   PLT 189  08/22/2018   Lab Results  Component Value Date   NA 139 08/22/2018   K 3.7 08/22/2018   CO2 31 08/22/2018   GLUCOSE 117 (H) 08/22/2018   BUN 11 08/22/2018   CREATININE 0.66 08/22/2018   BILITOT 1.0 08/22/2018   ALKPHOS 95 08/22/2016   AST 20 08/22/2018   ALT 27 08/22/2018   PROT 6.1 08/22/2018   ALBUMIN 4.3 08/22/2016   CALCIUM 10.1 08/22/2018   ANIONGAP 7 03/02/2016   Lab Results  Component Value Date   CHOL 127 03/12/2018   Lab Results  Component Value Date   HDL 37 (L) 03/12/2018   Lab Results  Component Value Date   LDLCALC 71 03/12/2018   Lab Results  Component Value Date   TRIG 106 03/12/2018   Lab Results  Component Value Date   CHOLHDL 3.4 03/12/2018   Lab Results  Component Value Date   HGBA1C 5.8 (H) 08/22/2018      Assessment & Plan:   Problem List Items Addressed This Visit      Other   Tobacco abuse   Encounter for chronic pain management - Primary    Indication for chronic opioid: Spinal stenosis, chronic low back pain as well as bilateral hip pain Medication and dose: Vicodin 7.5/325 every 8 hours as needed # pills per month: 90 Last UDS date: 02/19/2019 Opioid Treatment Agreement signed (Y/N): Y Opioid Treatment Agreement last reviewed with patient:  02/19/2019 NCCSRS reviewed this encounter (include red flags): Yes  Additional medications for pain control: Piroxicam, Robaxin, Cymbalta  Go ahead and write for first prescription to be sent she will need to follow-up every 2 months for chronic pain  management.      Relevant Orders   Pain Mgmt, Profile 5 w/o medMatch U   Bilateral hip pain    Gust that we can always do additional work-up and assessment encouraged her to work on stretches and avoiding staying stiff and continue to move.  May also be related somewhat to her spinal stenosis.         Meds ordered this encounter  Medications  . piroxicam (FELDENE) 10 MG capsule    Sig: Take 1 capsule (10 mg total) by mouth 2 (two)  times daily.    Dispense:  180 capsule    Refill:  0  . DISCONTD: HYDROcodone-acetaminophen (NORCO) 7.5-325 MG tablet    Sig: Take 1 tablet by mouth every 8 (eight) hours as needed for severe pain.    Dispense:  90 tablet    Refill:  0  . HYDROcodone-acetaminophen (NORCO) 7.5-325 MG tablet    Sig: Take 1 tablet by mouth every 8 (eight) hours as needed for severe pain.    Dispense:  90 tablet    Refill:  0    Follow-up: Return in about 2 months (around 04/22/2019) for pain medication.    Beatrice Lecher, MD

## 2019-02-20 DIAGNOSIS — M25551 Pain in right hip: Secondary | ICD-10-CM | POA: Insufficient documentation

## 2019-02-20 DIAGNOSIS — Z720 Tobacco use: Secondary | ICD-10-CM | POA: Insufficient documentation

## 2019-02-20 NOTE — Assessment & Plan Note (Signed)
Gust that we can always do additional work-up and assessment encouraged her to work on stretches and avoiding staying stiff and continue to move.  May also be related somewhat to her spinal stenosis.

## 2019-02-20 NOTE — Assessment & Plan Note (Addendum)
Indication for chronic opioid: Spinal stenosis, chronic low back pain as well as bilateral hip pain Medication and dose: Vicodin 7.5/325 every 8 hours as needed # pills per month: 90 Last UDS date: 02/19/2019 Opioid Treatment Agreement signed (Y/N): Y Opioid Treatment Agreement last reviewed with patient:  02/19/2019 NCCSRS reviewed this encounter (include red flags): Yes  Additional medications for pain control: Piroxicam, Robaxin, Cymbalta  Go ahead and write for first prescription to be sent she will need to follow-up every 2 months for chronic pain management.

## 2019-02-21 LAB — PAIN MGMT, PROFILE 5 W/O MEDMATCH U
Amphetamines: NEGATIVE ng/mL
Barbiturates: NEGATIVE ng/mL
Benzodiazepines: NEGATIVE ng/mL
Cocaine Metabolite: NEGATIVE ng/mL
Codeine: NEGATIVE ng/mL
Creatinine: 47.4 mg/dL
Hydrocodone: 537 ng/mL
Hydromorphone: 207 ng/mL
Marijuana Metabolite: NEGATIVE ng/mL
Methadone Metabolite: NEGATIVE ng/mL
Morphine: NEGATIVE ng/mL
Norhydrocodone: 2813 ng/mL
Opiates: POSITIVE ng/mL
Oxidant: NEGATIVE ug/mL
Oxycodone: NEGATIVE ng/mL
pH: 7.2 (ref 4.5–9.0)

## 2019-03-02 ENCOUNTER — Other Ambulatory Visit: Payer: Self-pay | Admitting: Family Medicine

## 2019-03-18 ENCOUNTER — Other Ambulatory Visit: Payer: Self-pay

## 2019-03-18 ENCOUNTER — Ambulatory Visit (INDEPENDENT_AMBULATORY_CARE_PROVIDER_SITE_OTHER): Payer: Medicare Other | Admitting: Nurse Practitioner

## 2019-03-18 ENCOUNTER — Encounter: Payer: Self-pay | Admitting: Nurse Practitioner

## 2019-03-18 VITALS — BP 119/74 | HR 87 | Ht 63.0 in | Wt 232.0 lb

## 2019-03-18 DIAGNOSIS — M25551 Pain in right hip: Secondary | ICD-10-CM | POA: Diagnosis not present

## 2019-03-18 MED ORDER — PREDNISONE 10 MG PO TABS: 20.0000 mg | ORAL_TABLET | Freq: Every day | ORAL | 0 refills | Status: AC

## 2019-03-18 NOTE — Assessment & Plan Note (Addendum)
Prednisone burst sent for 5 days. Continue medication for chronic pain and arthritis as this seems to significantly help. Strongly recommend imaging given the acute change in pain, but patient wishes to wait on this due to leaving for [place] in the morning. PT recommended and encouraged, but travels will not allow this at this time. Continue with gentle exercises provided by Dr. Madilyn Fireman as tolerated. Supportive care with lidocaine patches, ice, heat, and diclofenac recommended. Pt instructed to see provider in [place] if symptoms worsen or fail to improve. Recommend f/u with Dr. Darene Lamer for sports medicine evaluation when she returns if symptoms are still present.

## 2019-03-18 NOTE — Patient Instructions (Addendum)
I have called in a prednisone taper that will help decrease the inflammation and will hopefully relieve some of your pain. Please take the medication exactly as prescribed. This may increase your blood sugars, but this is an expected side effect that will go away after a week or so.   Heat and/or ice may be used on the area to help with the pain. Wrap and ice pack or bag of frozen vegetables in a thin towel and place on the hip/leg for 20 minutes on and then 20 minutes off. A heating pad on low may be used for 20 minutes on and then 20 minutes off, but do not sleep with a heating pad against the skin.   Lidocaine patches can be purchased from any pharmacy/drug store and may provide some pain relief to the area. Place the patch in the area of pain and follow the directions on the box.   Diclofenac gel may also be purchased over the counter at any pharmacy/drug store and may provide some relief. Do not use at the same time as the lidocaine patches. Follow the directions on the packaging for application to the area of pain.   Continue to take your medication for your chronic pain and arthritis since these seem to help.  If walking is causing more pain, try to avoid long periods of walking. Continue with the exercises given to you by Dr. Madilyn Fireman, if you can tolerate them.  If the pain continues, I highly suggest getting an x-ray of the area to make sure that this is muscular and not the bone. You may also need an MRI of the area to see if there is muscular damage. Please seek care if this does not improve in the next few days.

## 2019-03-18 NOTE — Progress Notes (Signed)
Virtual Visit via Telephone Note  I connected with Kelly Roth on 03/18/19 at 10:50 AM EST by telephone and verified that I am speaking with the correct person using two identifiers.   I discussed the limitations, risks, security and privacy concerns of performing an evaluation and management service by telephone and the availability of in person appointments. I also discussed with the patient that there may be a patient responsible charge related to this service. The patient expressed understanding and agreed to proceed.   Subjective:    CC: "Right hip pain that radiates to the right knee"  HPI: HIP PAIN Duration:5 days Involved hip: right  Mechanism of injury: none known- "just woke up one day and the pain took me to my knees" Location: lateral at the hip and anterior as it progresses towards the knee Onset: sudden  Severity: 9/10  Quality: sharp Frequency: constant Radiation: yes Aggravating factors: walking and standing     Alleviating factors: medicine for chronic pain helps some. Feels best when sleeping Status: worse and fluctuating Treatments attempted: medications for arthritis and chronic pain, rest and heat   Relief with NSAIDs?: No NSAIDs Taken Weakness with weight bearing: no Weakness with walking: feel very unstable Paresthesias / decreased sensation: no Swelling: no Redness:no Fevers: no  Pt reports she has been unable to take her chronic pain medication today due to the severity of the pain in her hip/leg. She reports the pain is decreased to 5/10 when she does take this medication. Pt reports the pain feels as though it is coming from the muscle. She is leaving for [place] in the morning to care for her mother and will not be back to the area until March.  Pt does not wish to have imaging done today. Pt does not wish to start PT at this time due to travel.   Past medical history, Surgical history, Family history not pertinant except as noted below,  Social history, Allergies, and medications have been entered into the medical record, reviewed, and corrections made.   Review of Systems:  No fevers, chills, night sweats, weight loss, chest pain, or shortness of breath. No falls, loss of balance, or injury to the hip or leg. No paresthesias, discoloration, or deformity to the hip or leg.   Objective:    General: Speaking clearly in complete sentences. Alert and oriented x3.  Normal judgment. It is apparent pt is in pain with frequent verbal expressions of pain/crying out.   Impression and Recommendations:    Problem List Items Addressed This Visit      Other   Hip pain, right - Primary    Prednisone burst sent for 5 days. Strongly recommend imaging given the acute change in pain, but patient wishes to wait on this due to leaving for [place] in the morning. PT recommended and encouraged, but travels will not allow this at this time. Supportive care with lidocaine patches, ice, heat, and diclofenac recommended. Pt instructed to see provider in [place] if symptoms worsen or fail to improve. Recommend f/u with Dr. Darene Lamer for sports medicine evaluation when she returns if symptoms are still present.          I discussed the assessment and treatment plan with the patient. The patient was provided an opportunity to ask questions and all were answered. The patient agreed with the plan and demonstrated an understanding of the instructions.   The patient was advised to call back or seek an in-person evaluation if the symptoms worsen  or if the condition fails to improve as anticipated.  I provided 25 minutes of non-face-to-face time during this encounter.   Orma Render, NP

## 2019-03-22 ENCOUNTER — Other Ambulatory Visit: Payer: Self-pay | Admitting: Family Medicine

## 2019-03-22 MED ORDER — HYDROCODONE-ACETAMINOPHEN 7.5-325 MG PO TABS: 1.0000 | ORAL_TABLET | Freq: Three times a day (TID) | ORAL | 0 refills | Status: DC | PRN

## 2019-03-22 NOTE — Telephone Encounter (Signed)
..  PDMP reviewed during this encounter. Last fill 12/8 UDS and pain contract UTD.  Refill sent.

## 2019-03-22 NOTE — Telephone Encounter (Signed)
Patient spouse called and wants to know if you could send a short term supply of Hydrocodone to [facility]. Please advise.

## 2019-03-27 ENCOUNTER — Other Ambulatory Visit: Payer: Self-pay | Admitting: Family Medicine

## 2019-03-27 DIAGNOSIS — R0602 Shortness of breath: Secondary | ICD-10-CM

## 2019-03-27 DIAGNOSIS — J449 Chronic obstructive pulmonary disease, unspecified: Secondary | ICD-10-CM

## 2019-03-28 ENCOUNTER — Telehealth: Payer: Self-pay

## 2019-03-28 NOTE — Telephone Encounter (Signed)
Kelly Roth called and states she is still having pain from her right hip to right knee. She reports the pain was resolved while taking the prednisone. She is requesting a refill.

## 2019-03-29 NOTE — Telephone Encounter (Signed)
l would recommend she schedule with Dr. Darene Lamer for evaluation.  She just finished the steroid 5 days ago so I want to be careful about overuse of sterioid.  It is a temp fix bc I believe she was out of town but she needs to be evaluated.

## 2019-03-29 NOTE — Telephone Encounter (Signed)
Patient advised.  She will call back to schedule.

## 2019-04-07 ENCOUNTER — Other Ambulatory Visit: Payer: Self-pay | Admitting: Family Medicine

## 2019-04-16 ENCOUNTER — Other Ambulatory Visit: Payer: Self-pay

## 2019-04-16 MED ORDER — HYDROCODONE-ACETAMINOPHEN 7.5-325 MG PO TABS: 1.0000 | ORAL_TABLET | Freq: Three times a day (TID) | ORAL | 0 refills | Status: DC | PRN

## 2019-04-16 NOTE — Telephone Encounter (Signed)
Nunzio Cory request refill on Hydrocodone. She has an appointment to follow up in March. Pended prescription to fill on the 6th of February.   [phone]

## 2019-04-17 ENCOUNTER — Other Ambulatory Visit: Payer: Self-pay | Admitting: Family Medicine

## 2019-05-08 ENCOUNTER — Other Ambulatory Visit: Payer: Self-pay | Admitting: Family Medicine

## 2019-05-18 ENCOUNTER — Other Ambulatory Visit: Payer: Self-pay | Admitting: Family Medicine

## 2019-05-20 ENCOUNTER — Other Ambulatory Visit: Payer: Self-pay

## 2019-05-20 ENCOUNTER — Ambulatory Visit (INDEPENDENT_AMBULATORY_CARE_PROVIDER_SITE_OTHER): Payer: Medicare Other | Admitting: Family Medicine

## 2019-05-20 ENCOUNTER — Encounter: Payer: Self-pay | Admitting: Family Medicine

## 2019-05-20 VITALS — BP 117/51 | HR 62 | Ht 63.0 in | Wt 218.0 lb

## 2019-05-20 DIAGNOSIS — Z720 Tobacco use: Secondary | ICD-10-CM

## 2019-05-20 DIAGNOSIS — Z9181 History of falling: Secondary | ICD-10-CM

## 2019-05-20 DIAGNOSIS — I4891 Unspecified atrial fibrillation: Secondary | ICD-10-CM | POA: Diagnosis not present

## 2019-05-20 DIAGNOSIS — I10 Essential (primary) hypertension: Secondary | ICD-10-CM

## 2019-05-20 DIAGNOSIS — M48062 Spinal stenosis, lumbar region with neurogenic claudication: Secondary | ICD-10-CM | POA: Diagnosis not present

## 2019-05-20 DIAGNOSIS — Z636 Dependent relative needing care at home: Secondary | ICD-10-CM | POA: Diagnosis not present

## 2019-05-20 DIAGNOSIS — M159 Polyosteoarthritis, unspecified: Secondary | ICD-10-CM | POA: Diagnosis not present

## 2019-05-20 DIAGNOSIS — G8929 Other chronic pain: Secondary | ICD-10-CM

## 2019-05-20 MED ORDER — DULOXETINE HCL 60 MG PO CPEP: 60.0000 mg | ORAL_CAPSULE | Freq: Every day | ORAL | 1 refills | Status: DC

## 2019-05-20 MED ORDER — HYDROCODONE-ACETAMINOPHEN 7.5-325 MG PO TABS: 1.0000 | ORAL_TABLET | Freq: Three times a day (TID) | ORAL | 0 refills | Status: DC | PRN

## 2019-05-20 MED ORDER — TRAMADOL HCL 50 MG PO TABS: 50.0000 mg | ORAL_TABLET | Freq: Every day | ORAL | 0 refills | Status: AC | PRN

## 2019-05-20 NOTE — Assessment & Plan Note (Signed)
Is on Xarelto from Dr. Mauricio Po so we discussed avoiding all NSAIDs we will discontinue her piroxicam.  Apologize for feeling it previously I did let her know about the potential increased risk for bleeds.  We discussed options to control her pain since it does seem to work well for her.  She is interested in maybe trying tramadol as needed just for the occasional extra pain relief for her arthritis.  She already takes Tylenol daily and her hydrocodone so I do not add more to her regimen.

## 2019-05-20 NOTE — Telephone Encounter (Signed)
Is she still taking Xarelto?  If so then she does not need to take any anti-inflammatory with it.  It can significantly increase her risk for bleeding and hemorrhagic strokes.

## 2019-05-20 NOTE — Assessment & Plan Note (Signed)
Opiod Risk Score of 0. Done 02/19/2019. Hydrocodone 7.5/325 1 po TID #90/mo History of lumbar laminectomy with decompression and microdiscectomy December 2017. Additional medications: tramadol,  Robaxin, Cymbalta

## 2019-05-20 NOTE — Assessment & Plan Note (Signed)
Really proud of her for cutting back to 2 cigarettes a day and just encouraged her to continue to work at it.

## 2019-05-20 NOTE — Progress Notes (Signed)
Pt states the last month has been difficult due to caring for her mother with dementia.  She is proud of her weight loss and states she's taking it nice and slow.   Pt has taken steps to reduce her smoking. Pt's goal is to quit smoking. She's currently smoking 2 cigarettes per day.

## 2019-05-20 NOTE — Telephone Encounter (Signed)
Fyi - Pt is being seen by provider this afternoon. Pls update pt. Thanks.

## 2019-05-20 NOTE — Patient Instructions (Signed)
Please stop the piroxicam as it does not play will help with your Xarelto.

## 2019-05-20 NOTE — Assessment & Plan Note (Signed)
Feeling very stress as her mothers dementia is worrsening. Has a family meeting coming up to discuss her mothers care.

## 2019-05-20 NOTE — Assessment & Plan Note (Addendum)
Well controlled. Continue current regimen. Follow up in  4 months.  Just encouraged her to continue to work on healthy diet and weight loss.

## 2019-05-20 NOTE — Progress Notes (Addendum)
Established Patient Office Visit  Subjective:  Patient ID: Kelly Roth, female    DOB: 10/17/1950  Age: 69 y.o. MRN: [id number]  CC:  Chief Complaint  Patient presents with  . pain medication    HPI [name] presents for   Pt states the last month has been difficult due to caring for her mother with dementia.  Unfortunately over the last 3 to 4 months her mother's dementia has been advancing and has been more of a struggle to take care of her.  She usually flies out for 1 to 2 months at a time and then comes back home for just a few weeks in fact she is flying back out again in about 2 weeks to help take care of her and have a big family meeting about what their long-term care plan will be for her.  She is proud of her weight loss and states she's taking it nice and slow.   Pt has taken steps to reduce her smoking. Pt's goal is to quit smoking. She's currently smoking 2 cigarettes per day.  She says she only smokes when she is visiting her mother and gets really stressed.  In regards to her arthritis she typically will take a piroxicam daily to help with pain and inflammation and it does seem to provide some pain relief from her.  She is actually on Xarelto for her A. fib from her cardiologist.  He also wanted to let me know that she has fallen twice recently.  She says both times she actually felt like her legs suddenly got weak.  Once was when she was putting some stuff in there for generator and she stood up to step back and then just felt like her legs were too weak to actually finish standing up and so she fell backwards.  She says another time was when she was at her mother's house there is 1 step up between the main living area and the other part of the house.  She said she put 1 foot up to step and then again felt like her legs were extremely weak.  She says she has checked her blood pressure when this happens and says it was normal.  Past Medical History:   Diagnosis Date  . Adenomatous polyps 2004    Colonoscopy done in [facility]  . Arthritis   . Back pain   . Complication of anesthesia   . COPD (chronic obstructive pulmonary disease) ([place])   . Dyspnea    pain contributes but also pt. reveals that she has been out of her Spiriva for one month +  . GERD (gastroesophageal reflux disease)   . Hemorrhoids   . Hyperlipidemia   . Hypertension   . Osteoarthritis   . PONV (postoperative nausea and vomiting)    scopolamine patch used with success with a prev. surg.     Past Surgical History:  Procedure Laterality Date  . CHOLECYSTECTOMY  05-08-07  . JOINT REPLACEMENT Bilateral   . left knee replacement  3-07   right on 2-06  . LUMBAR LAMINECTOMY/DECOMPRESSION MICRODISCECTOMY N/A 03/01/2016   Procedure: LEFT AND CENTRAL L3-4 AND LEFT L4-5 DECOMPRESSIVE LUMBAR LAMINECTOMY FOR SPINAL STENOSIS;  Surgeon: Jessy Oto, MD;  Location: [facility];  Service: Orthopedics;  Laterality: N/A;  . MULTIPLE TOOTH EXTRACTIONS      Family History  Problem Relation Age of Onset  . Depression Mother   . Hypertension Mother   . Hyperlipidemia Mother   .  COPD Mother   . Lung cancer Mother   . Hyperlipidemia Father   . Hypertension Father   . Colon cancer Neg Hx        Neg     Social History   Socioeconomic History  . Marital status: Married    Spouse name: Not on file  . Number of children: Not on file  . Years of education: Not on file  . Highest education level: Not on file  Occupational History  . Occupation: Caregiver  Tobacco Use  . Smoking status: Current Every Day Smoker    Packs/day: 0.50    Types: Cigarettes  . Smokeless tobacco: Never Used  . Tobacco comment: very rare use, she remarks "I had one cigarette today, reports thia is the first one in a month"  Substance and Sexual Activity  . Alcohol use: Yes    Alcohol/week: 0.0 standard drinks    Comment: one drink a week   . Drug use: No  . Sexual activity: Not on file  Other  Topics Concern  . 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   Social Determinants of Health   Financial Resource Strain:   . Difficulty of Paying Living Expenses: Not on file  Food Insecurity:   . Worried About Charity fundraiser in the Last Year: Not on file  . Ran Out of Food in the Last Year: Not on file  Transportation Needs:   . Lack of Transportation (Medical): Not on file  . Lack of Transportation (Non-Medical): Not on file  Physical Activity:   . Days of Exercise per Week: Not on file  . Minutes of Exercise per Session: Not on file  Stress:   . Feeling of Stress : Not on file  Social Connections:   . Frequency of Communication with Friends and Family: Not on file  . Frequency of Social Gatherings with Friends and Family: Not on file  . Attends Religious Services: Not on file  . Active Member of Clubs or Organizations: Not on file  . Attends Archivist Meetings: Not on file  . Marital Status: Not on file  Intimate Partner Violence:   . Fear of Current or Ex-Partner: Not on file  . Emotionally Abused: Not on file  . Physically Abused: Not on file  . Sexually Abused: Not on file    Outpatient Medications Prior to Visit  Medication Sig Dispense Refill  . albuterol (VENTOLIN HFA) 108 (90 Base) MCG/ACT inhaler TAKE 2 PUFFS BY MOUTH EVERY 6 HOURS AS NEEDED FOR WHEEZE OR SHORTNESS OF BREATH 18 g 3  . AMBULATORY NON FORMULARY MEDICATION Medication Name: Order for overnight pulse oximetry. Patient has moderate COPD. She did not qualify for daytime oxygen but concerned that she may be having drops in oxygen at night. Fax to [facility] 1 Units 0  . ANORO ELLIPTA 62.5-25 MCG/INH AEPB INHALE 1 PUFF BY MOUTH EVERY DAY 60 each 6  . BYSTOLIC 2.5 MG tablet TAKE 1 TABLET BY MOUTH EVERY DAY 90 tablet 0  . CHANTIX 1 MG tablet Take 1 mg by mouth 2 (two) times daily.    . furosemide (LASIX) 40 MG tablet TAKE 2 TABLET TWICE A DAY AS  NEEDED LEG SWELLING 30 tablet 1  . lisinopril-hydrochlorothiazide (ZESTORETIC) 20-12.5 MG tablet Take 1 tablet by mouth daily. 90 tablet 1  . methocarbamol (ROBAXIN) 500 MG tablet Take 1 tablet (500 mg total) by mouth every 8 (eight)  hours as needed for muscle spasms. 90 tablet 1  . metoprolol succinate (TOPROL-XL) 25 MG 24 hr tablet Take 25 mg by mouth daily.    . Multiple Vitamin (MULTIVITAMIN) capsule Take 1 capsule by mouth daily.      . rivaroxaban (XARELTO) 20 MG TABS tablet Take 20 mg by mouth daily.    . rosuvastatin (CRESTOR) 40 MG tablet TAKE 1 TABLET BY MOUTH EVERY DAY 90 tablet 3  . HYDROcodone-acetaminophen (NORCO) 7.5-325 MG tablet Take 1 tablet by mouth every 8 (eight) hours as needed for severe pain. 90 tablet 0  . piroxicam (FELDENE) 10 MG capsule Take 1 capsule (10 mg total) by mouth 2 (two) times daily. 180 capsule 0  . DULoxetine (CYMBALTA) 60 MG capsule Take 60 mg by mouth daily.     No facility-administered medications prior to visit.    Allergies  Allergen Reactions  . Nsaids Other (See Comments)    ROS Review of Systems    Objective:    Physical Exam  Constitutional: She is oriented to person, place, and time. She appears well-developed and well-nourished.  HENT:  Head: Normocephalic and atraumatic.  Cardiovascular: Normal rate, regular rhythm and normal heart sounds.  Pulmonary/Chest: Effort normal and breath sounds normal.  Neurological: She is alert and oriented to person, place, and time.  Skin: Skin is warm and dry.  Psychiatric: She has a normal mood and affect. Her behavior is normal.    BP (!) 117/51 (BP Location: Right Arm, Patient Position: Sitting, Cuff Size: Large)   Pulse 62   Ht 5\' 3"  (1.6 m)   Wt 218 lb 0.6 oz (98.9 kg)   SpO2 99%   BMI 38.62 kg/m  Wt Readings from Last 3 Encounters:  05/20/19 218 lb 0.6 oz (98.9 kg)  03/18/19 232 lb (105.2 kg)  02/19/19 232 lb (105.2 kg)     Health Maintenance Due  Topic Date Due  . DEXA SCAN   03/02/2018  . PNA vac Low Risk Adult (2 of 2 - PCV13) 05/14/2019    There are no preventive care reminders to display for this patient.  Lab Results  Component Value Date   TSH 1.75 08/22/2018   Lab Results  Component Value Date   WBC 9.6 08/22/2018   HGB 14.0 08/22/2018   HCT 43.2 08/22/2018   MCV 94.7 08/22/2018   PLT 189 08/22/2018   Lab Results  Component Value Date   NA 139 08/22/2018   K 3.7 08/22/2018   CO2 31 08/22/2018   GLUCOSE 117 (H) 08/22/2018   BUN 11 08/22/2018   CREATININE 0.66 08/22/2018   BILITOT 1.0 08/22/2018   ALKPHOS 95 08/22/2016   AST 20 08/22/2018   ALT 27 08/22/2018   PROT 6.1 08/22/2018   ALBUMIN 4.3 08/22/2016   CALCIUM 10.1 08/22/2018   ANIONGAP 7 03/02/2016   Lab Results  Component Value Date   CHOL 127 03/12/2018   Lab Results  Component Value Date   HDL 37 (L) 03/12/2018   Lab Results  Component Value Date   LDLCALC 71 03/12/2018   Lab Results  Component Value Date   TRIG 106 03/12/2018   Lab Results  Component Value Date   CHOLHDL 3.4 03/12/2018   Lab Results  Component Value Date   HGBA1C 5.8 (H) 08/22/2018      Assessment & Plan:   Problem List Items Addressed This Visit      Cardiovascular and Mediastinum   HYPERTENSION, BENIGN    Well controlled.  Continue current regimen. Follow up in  4 months.  Just encouraged her to continue to work on healthy diet and weight loss.      Relevant Orders   COMPLETE METABOLIC PANEL WITH GFR   CBC   Atrial fibrillation (HCC)    Is on Xarelto from Dr. Mauricio Po so we discussed avoiding all NSAIDs we will discontinue her piroxicam.  Apologize for feeling it previously I did let her know about the potential increased risk for bleeds.  We discussed options to control her pain since it does seem to work well for her.  She is interested in maybe trying tramadol as needed just for the occasional extra pain relief for her arthritis.  She already takes Tylenol daily and her  hydrocodone so I do not add more to her regimen.      Relevant Orders   COMPLETE METABOLIC PANEL WITH GFR   CBC     Other   Tobacco abuse    Really proud of her for cutting back to 2 cigarettes a day and just encouraged her to continue to work at it.      Neurogenic claudication due to lumbar spinal stenosis   Relevant Medications   DULoxetine (CYMBALTA) 60 MG capsule   HYDROcodone-acetaminophen (NORCO) 7.5-325 MG tablet   traMADol (ULTRAM) 50 MG tablet   Encounter for chronic pain management - Primary    Opiod Risk Score of 0. Done 02/19/2019. Hydrocodone 7.5/325 1 po TID #90/mo History of lumbar laminectomy with decompression and microdiscectomy December 2017. Additional medications: tramadol,  Robaxin, Cymbalta      Relevant Medications   DULoxetine (CYMBALTA) 60 MG capsule   HYDROcodone-acetaminophen (NORCO) 7.5-325 MG tablet   Other Relevant Orders   Pain Mgmt, Profile 5 w/o [facility]   Caregiver burden    Feeling very stress as her mothers dementia is worrsening. Has a family meeting coming up to discuss her mothers care.        Other Visit Diagnoses    Generalized OA       Relevant Medications   HYDROcodone-acetaminophen (NORCO) 7.5-325 MG tablet   traMADol (ULTRAM) 50 MG tablet   History of recent fall         History of recent fall-unclear etiology it sounds like both episodes she feels like were related to weakness in her legs but she does not feel like she has had any numbness or tingling.  She felt like her blood pressure was normal when it happened.   Meds ordered this encounter  Medications  . DULoxetine (CYMBALTA) 60 MG capsule    Sig: Take 1 capsule (60 mg total) by mouth daily.    Dispense:  90 capsule    Refill:  1  . HYDROcodone-acetaminophen (NORCO) 7.5-325 MG tablet    Sig: Take 1 tablet by mouth every 8 (eight) hours as needed for severe pain.    Dispense:  90 tablet    Refill:  0  . traMADol (ULTRAM) 50 MG tablet    Sig: Take 1 tablet (50  mg total) by mouth daily as needed for up to 5 days.    Dispense:  15 tablet    Refill:  0    Follow-up: Return in about 2 months (around 07/20/2019) for Pain Management .    Beatrice Lecher, MD

## 2019-05-21 LAB — CBC
HCT: 42.2 % (ref 35.0–45.0)
Hemoglobin: 13.8 g/dL (ref 11.7–15.5)
MCH: 28.3 pg (ref 27.0–33.0)
MCHC: 32.7 g/dL (ref 32.0–36.0)
MCV: 86.5 fL (ref 80.0–100.0)
MPV: 10.6 fL (ref 7.5–12.5)
Platelets: 274 10*3/uL (ref 140–400)
RBC: 4.88 10*6/uL (ref 3.80–5.10)
RDW: 18.1 % — ABNORMAL HIGH (ref 11.0–15.0)
WBC: 8.4 10*3/uL (ref 3.8–10.8)

## 2019-05-21 LAB — COMPLETE METABOLIC PANEL WITH GFR
AG Ratio: 1.3 (calc) (ref 1.0–2.5)
ALT: 19 U/L (ref 6–29)
AST: 16 U/L (ref 10–35)
Albumin: 3.8 g/dL (ref 3.6–5.1)
Alkaline phosphatase (APISO): 118 U/L (ref 37–153)
BUN: 20 mg/dL (ref 7–25)
CO2: 25 mmol/L (ref 20–32)
Calcium: 10.5 mg/dL — ABNORMAL HIGH (ref 8.6–10.4)
Chloride: 98 mmol/L (ref 98–110)
Creat: 0.98 mg/dL (ref 0.50–0.99)
GFR, Est African American: 70 mL/min/{1.73_m2} (ref >=60)
GFR, Est Non African American: 60 mL/min/{1.73_m2} (ref >=60)
Globulin: 2.9 g/dL (calc) (ref 1.9–3.7)
Glucose, Bld: 103 mg/dL (ref 65–139)
Potassium: 4.1 mmol/L (ref 3.5–5.3)
Sodium: 134 mmol/L — ABNORMAL LOW (ref 135–146)
Total Bilirubin: 0.5 mg/dL (ref 0.2–1.2)
Total Protein: 6.7 g/dL (ref 6.1–8.1)

## 2019-05-22 LAB — PAIN MGMT, PROFILE 5 W/O MEDMATCH U
Amphetamines: NEGATIVE ng/mL
Barbiturates: NEGATIVE ng/mL
Benzodiazepines: NEGATIVE ng/mL
Cocaine Metabolite: NEGATIVE ng/mL
Codeine: NEGATIVE ng/mL
Creatinine: 19.2 mg/dL
Hydrocodone: 361 ng/mL
Hydromorphone: NEGATIVE ng/mL
Marijuana Metabolite: NEGATIVE ng/mL
Methadone Metabolite: NEGATIVE ng/mL
Morphine: NEGATIVE ng/mL
Norhydrocodone: 578 ng/mL
Opiates: POSITIVE ng/mL
Oxidant: NEGATIVE ug/mL
Oxycodone: NEGATIVE ng/mL
Specific Gravity: 1.005 (ref >=1.0)
pH: 6.9 (ref 4.5–9.0)

## 2019-06-07 DIAGNOSIS — Z23 Encounter for immunization: Secondary | ICD-10-CM | POA: Diagnosis not present

## 2019-06-20 ENCOUNTER — Other Ambulatory Visit: Payer: Self-pay

## 2019-06-20 DIAGNOSIS — G8929 Other chronic pain: Secondary | ICD-10-CM

## 2019-06-20 MED ORDER — HYDROCODONE-ACETAMINOPHEN 7.5-325 MG PO TABS: 1.0000 | ORAL_TABLET | Freq: Three times a day (TID) | ORAL | 0 refills | Status: DC | PRN

## 2019-07-15 DIAGNOSIS — Z23 Encounter for immunization: Secondary | ICD-10-CM | POA: Diagnosis not present

## 2019-07-16 DIAGNOSIS — H2513 Age-related nuclear cataract, bilateral: Secondary | ICD-10-CM | POA: Diagnosis not present

## 2019-07-17 DIAGNOSIS — H02831 Dermatochalasis of right upper eyelid: Secondary | ICD-10-CM | POA: Diagnosis not present

## 2019-07-17 DIAGNOSIS — H25813 Combined forms of age-related cataract, bilateral: Secondary | ICD-10-CM | POA: Diagnosis not present

## 2019-07-17 DIAGNOSIS — H40033 Anatomical narrow angle, bilateral: Secondary | ICD-10-CM | POA: Diagnosis not present

## 2019-07-17 DIAGNOSIS — H527 Unspecified disorder of refraction: Secondary | ICD-10-CM | POA: Diagnosis not present

## 2019-07-17 DIAGNOSIS — H33193 Other retinoschisis and retinal cysts, bilateral: Secondary | ICD-10-CM | POA: Diagnosis not present

## 2019-07-17 DIAGNOSIS — H52203 Unspecified astigmatism, bilateral: Secondary | ICD-10-CM | POA: Diagnosis not present

## 2019-07-17 DIAGNOSIS — H02834 Dermatochalasis of left upper eyelid: Secondary | ICD-10-CM | POA: Diagnosis not present

## 2019-07-22 ENCOUNTER — Encounter: Payer: Self-pay | Admitting: Family Medicine

## 2019-07-22 ENCOUNTER — Ambulatory Visit (INDEPENDENT_AMBULATORY_CARE_PROVIDER_SITE_OTHER): Payer: Medicare Other | Admitting: Family Medicine

## 2019-07-22 VITALS — BP 170/73 | HR 116 | Ht 63.0 in | Wt 223.0 lb

## 2019-07-22 DIAGNOSIS — Z1231 Encounter for screening mammogram for malignant neoplasm of breast: Secondary | ICD-10-CM

## 2019-07-22 DIAGNOSIS — M79652 Pain in left thigh: Secondary | ICD-10-CM | POA: Diagnosis not present

## 2019-07-22 DIAGNOSIS — M79651 Pain in right thigh: Secondary | ICD-10-CM | POA: Diagnosis not present

## 2019-07-22 DIAGNOSIS — I4891 Unspecified atrial fibrillation: Secondary | ICD-10-CM

## 2019-07-22 DIAGNOSIS — G8929 Other chronic pain: Secondary | ICD-10-CM | POA: Diagnosis not present

## 2019-07-22 DIAGNOSIS — I10 Essential (primary) hypertension: Secondary | ICD-10-CM

## 2019-07-22 DIAGNOSIS — Z720 Tobacco use: Secondary | ICD-10-CM | POA: Diagnosis not present

## 2019-07-22 MED ORDER — NEBIVOLOL HCL 2.5 MG PO TABS: 2.5000 mg | ORAL_TABLET | Freq: Every day | ORAL | 1 refills | Status: DC

## 2019-07-22 MED ORDER — LISINOPRIL-HYDROCHLOROTHIAZIDE 20-12.5 MG PO TABS: 1.0000 | ORAL_TABLET | Freq: Every day | ORAL | 1 refills | Status: DC

## 2019-07-22 MED ORDER — HYDROCODONE-ACETAMINOPHEN 7.5-325 MG PO TABS: 1.0000 | ORAL_TABLET | Freq: Three times a day (TID) | ORAL | 0 refills | Status: DC | PRN

## 2019-07-22 NOTE — Assessment & Plan Note (Signed)
Reminded her to continue to avoid NSAIDs

## 2019-07-22 NOTE — Assessment & Plan Note (Addendum)
Uncontrolled. Out of her bystolic and she did not take any of her other medications today.. Will refill.  Just reminded her to be as consistent as possible.

## 2019-07-22 NOTE — Assessment & Plan Note (Signed)
Still working on cutting back on cigarette use.  She says she is down to about 5/day and just encouraged her to continue to set small goals for herself and work towards it.  She is still taking Chantix.

## 2019-07-22 NOTE — Assessment & Plan Note (Signed)
Indication for chronic opioid: chronic low back pain and spinal stenosis Medication and dose: Hydrocodone 7.5/325 1 tab po TD # pills per month: 90 per month Last UDS date: 07/22/2019 Opioid Treatment Agreement signed (Y/N): Y Opioid Treatment Agreement last reviewed with patient:  Y NCCSRS reviewed this encounter (include red flags): Yes

## 2019-07-22 NOTE — Progress Notes (Signed)
Established Patient Office Visit  Subjective:  Patient ID: Kelly Roth, female    DOB: 26-Oct-1950  Age: 69 y.o. MRN: [id number]  CC:  Chief Complaint  Patient presents with  . chronic pain management    HPI SOLENA CRISMAN presents for chronic pain management. She is doing okay overall happy with her current regimen. She has noted that she feels like she is having to use her walker more often because of pain in her thighs she knows a lot of it is deconditioning of the muscle tissue and wants to know what exercises she could do to help.  Hydrocodone 7.5/325 1 po TID #90/mo History of lumbar laminectomy with decompression and microdiscectomy December 2017. Additional medications: tramadol,  Robaxin, Cymbalta   Tobacco abuse-last month after she worked out about 2 cigarettes/day but with some recent stressors and helping take care of her mother she is back up to 5 cigarettes a day but says she is working on it.  Hypertension-she said she really does not feel like taking her other medications this morning except her pain medication and she is out of her Bystolic.  Past Medical History:  Diagnosis Date  . Adenomatous polyps 2004    Colonoscopy done in [facility]  . Arthritis   . Back pain   . Complication of anesthesia   . COPD (chronic obstructive pulmonary disease) ([place])   . Dyspnea    pain contributes but also pt. reveals that she has been out of her Spiriva for one month +  . GERD (gastroesophageal reflux disease)   . Hemorrhoids   . Hyperlipidemia   . Hypertension   . Osteoarthritis   . PONV (postoperative nausea and vomiting)    scopolamine patch used with success with a prev. surg.     Past Surgical History:  Procedure Laterality Date  . CHOLECYSTECTOMY  05-08-07  . JOINT REPLACEMENT Bilateral   . left knee replacement  3-07   right on 2-06  . LUMBAR LAMINECTOMY/DECOMPRESSION MICRODISCECTOMY N/A 03/01/2016   Procedure: LEFT AND CENTRAL L3-4 AND LEFT  L4-5 DECOMPRESSIVE LUMBAR LAMINECTOMY FOR SPINAL STENOSIS;  Surgeon: Jessy Oto, MD;  Location: [facility];  Service: Orthopedics;  Laterality: N/A;  . MULTIPLE TOOTH EXTRACTIONS      Family History  Problem Relation Age of Onset  . Depression Mother   . Hypertension Mother   . Hyperlipidemia Mother   . COPD Mother   . Lung cancer Mother   . Hyperlipidemia Father   . Hypertension Father   . Colon cancer Neg Hx        Neg     Social History   Socioeconomic History  . Marital status: Married    Spouse name: Not on file  . Number of children: Not on file  . Years of education: Not on file  . Highest education level: Not on file  Occupational History  . Occupation: Caregiver  Tobacco Use  . Smoking status: Current Every Day Smoker    Packs/day: 0.50    Types: Cigarettes  . Smokeless tobacco: Never Used  . Tobacco comment: very rare use, she remarks "I had one cigarette today, reports thia is the first one in a month"  Substance and Sexual Activity  . Alcohol use: Yes    Alcohol/week: 0.0 standard drinks    Comment: one drink a week   . Drug use: No  . Sexual activity: Not on file  Other Topics Concern  . Not on file  Social  History Narrative   Currently taking care of mother with dementia.   Social Determinants of Health   Financial Resource Strain:   . Difficulty of Paying Living Expenses:   Food Insecurity:   . Worried About Charity fundraiser in the Last Year:   . Arboriculturist in the Last Year:   Transportation Needs:   . Film/video editor (Medical):   Marland Kitchen Lack of Transportation (Non-Medical):   Physical Activity:   . Days of Exercise per Week:   . Minutes of Exercise per Session:   Stress:   . Feeling of Stress :   Social Connections:   . Frequency of Communication with Friends and Family:   . Frequency of Social Gatherings with Friends and Family:   . Attends Religious Services:   . Active Member of Clubs or Organizations:   . Attends Theatre manager Meetings:   Marland Kitchen Marital Status:   Intimate Partner Violence:   . Fear of Current or Ex-Partner:   . Emotionally Abused:   Marland Kitchen Physically Abused:   . Sexually Abused:     Outpatient Medications Prior to Visit  Medication Sig Dispense Refill  . albuterol (VENTOLIN HFA) 108 (90 Base) MCG/ACT inhaler TAKE 2 PUFFS BY MOUTH EVERY 6 HOURS AS NEEDED FOR WHEEZE OR SHORTNESS OF BREATH 18 g 3  . AMBULATORY NON FORMULARY MEDICATION Medication Name: Order for overnight pulse oximetry. Patient has moderate COPD. She did not qualify for daytime oxygen but concerned that she may be having drops in oxygen at night. Fax to [facility] 1 Units 0  . ANORO ELLIPTA 62.5-25 MCG/INH AEPB INHALE 1 PUFF BY MOUTH EVERY DAY 60 each 6  . CHANTIX 1 MG tablet Take 1 mg by mouth 2 (two) times daily.    . DULoxetine (CYMBALTA) 60 MG capsule Take 1 capsule (60 mg total) by mouth daily. 90 capsule 1  . Multiple Vitamin (MULTIVITAMIN) capsule Take 1 capsule by mouth daily.      . rivaroxaban (XARELTO) 20 MG TABS tablet Take 20 mg by mouth daily.    . rosuvastatin (CRESTOR) 40 MG tablet TAKE 1 TABLET BY MOUTH EVERY DAY 90 tablet 3  . BYSTOLIC 2.5 MG tablet TAKE 1 TABLET BY MOUTH EVERY DAY 90 tablet 0  . furosemide (LASIX) 40 MG tablet TAKE 2 TABLET TWICE A DAY AS NEEDED LEG SWELLING 30 tablet 1  . HYDROcodone-acetaminophen (NORCO) 7.5-325 MG tablet Take 1 tablet by mouth every 8 (eight) hours as needed for severe pain. 90 tablet 0  . lisinopril-hydrochlorothiazide (ZESTORETIC) 20-12.5 MG tablet Take 1 tablet by mouth daily. 90 tablet 1  . metoprolol succinate (TOPROL-XL) 25 MG 24 hr tablet Take 25 mg by mouth daily.    . methocarbamol (ROBAXIN) 500 MG tablet Take 1 tablet (500 mg total) by mouth every 8 (eight) hours as needed for muscle spasms. 90 tablet 1   No facility-administered medications prior to visit.    Allergies  Allergen Reactions  . Nsaids Other (See Comments)    ROS Review of Systems     Objective:    Physical Exam  Constitutional: She is oriented to person, place, and time. She appears well-developed and well-nourished.  HENT:  Head: Normocephalic and atraumatic.  Cardiovascular: Normal rate and normal heart sounds.  In afib, irreg rhythm  Pulmonary/Chest: Effort normal and breath sounds normal.  Neurological: She is alert and oriented to person, place, and time.  Skin: Skin is warm and dry.  Psychiatric: She has  a normal mood and affect. Her behavior is normal.    BP (!) 170/73   Pulse (!) 116   Ht 5\' 3"  (1.6 m)   Wt 223 lb (101.2 kg)   SpO2 96%   BMI 39.50 kg/m  Wt Readings from Last 3 Encounters:  07/22/19 223 lb (101.2 kg)  05/20/19 218 lb 0.6 oz (98.9 kg)  03/18/19 232 lb (105.2 kg)     Health Maintenance Due  Topic Date Due  . COVID-19 Vaccine (1) Never done  . DEXA SCAN  Never done  . PNA vac Low Risk Adult (2 of 2 - PCV13) 05/14/2019    There are no preventive care reminders to display for this patient.  Lab Results  Component Value Date   TSH 1.75 08/22/2018   Lab Results  Component Value Date   WBC 8.4 05/20/2019   HGB 13.8 05/20/2019   HCT 42.2 05/20/2019   MCV 86.5 05/20/2019   PLT 274 05/20/2019   Lab Results  Component Value Date   NA 134 (L) 05/20/2019   K 4.1 05/20/2019   CO2 25 05/20/2019   GLUCOSE 103 05/20/2019   BUN 20 05/20/2019   CREATININE 0.98 05/20/2019   BILITOT 0.5 05/20/2019   ALKPHOS 95 08/22/2016   AST 16 05/20/2019   ALT 19 05/20/2019   PROT 6.7 05/20/2019   ALBUMIN 4.3 08/22/2016   CALCIUM 10.5 (H) 05/20/2019   ANIONGAP 7 03/02/2016   Lab Results  Component Value Date   CHOL 127 03/12/2018   Lab Results  Component Value Date   HDL 37 (L) 03/12/2018   Lab Results  Component Value Date   LDLCALC 71 03/12/2018   Lab Results  Component Value Date   TRIG 106 03/12/2018   Lab Results  Component Value Date   CHOLHDL 3.4 03/12/2018   Lab Results  Component Value Date   HGBA1C 5.8 (H)  08/22/2018      Assessment & Plan:   Problem List Items Addressed This Visit      Cardiovascular and Mediastinum   HYPERTENSION, BENIGN    Uncontrolled. Out of her bystolic and she did not take any of her other medications today.. Will refill.  Just reminded her to be as consistent as possible.      Relevant Medications   nebivolol (BYSTOLIC) 2.5 MG tablet   lisinopril-hydrochlorothiazide (ZESTORETIC) 20-12.5 MG tablet   Atrial fibrillation (HCC)    Reminded her to continue to avoid NSAIDs      Relevant Medications   nebivolol (BYSTOLIC) 2.5 MG tablet   lisinopril-hydrochlorothiazide (ZESTORETIC) 20-12.5 MG tablet     Other   Tobacco abuse    Still working on cutting back on cigarette use.  She says she is down to about 5/day and just encouraged her to continue to set small goals for herself and work towards it.  She is still taking Chantix.      Encounter for chronic pain management - Primary    Indication for chronic opioid: chronic low back pain and spinal stenosis Medication and dose: Hydrocodone 7.5/325 1 tab po TD # pills per month: 90 per month Last UDS date: 07/22/2019 Opioid Treatment Agreement signed (Y/N): Y Opioid Treatment Agreement last reviewed with patient:  Y NCCSRS reviewed this encounter (include red flags): Yes       Relevant Medications   HYDROcodone-acetaminophen (NORCO) 7.5-325 MG tablet   Other Relevant Orders   DRUG MONITORING, PANEL 6 WITH CONFIRMATION, URINE    Other Visit Diagnoses  Pain in both thighs       Screening mammogram, encounter for       Relevant Orders   MM 3D SCREEN BREAST BILATERAL     Also reminded her to schedule her mammogram.  Bilateral thigh pain-we discussed some strategies around working on strengthening while sitting and doing some chair exercises in addition given a handout to do on her own at home we can always consider formal physical therapy if she would like as well.  Meds ordered this encounter   Medications  . nebivolol (BYSTOLIC) 2.5 MG tablet    Sig: Take 1 tablet (2.5 mg total) by mouth daily.    Dispense:  90 tablet    Refill:  1  . lisinopril-hydrochlorothiazide (ZESTORETIC) 20-12.5 MG tablet    Sig: Take 1 tablet by mouth daily.    Dispense:  90 tablet    Refill:  1  . HYDROcodone-acetaminophen (NORCO) 7.5-325 MG tablet    Sig: Take 1 tablet by mouth every 8 (eight) hours as needed for severe pain.    Dispense:  90 tablet    Refill:  0    Follow-up: Return in about 2 months (around 09/21/2019) for Pain management.    Beatrice Lecher, MD

## 2019-07-24 LAB — DRUG MONITORING, PANEL 6 WITH CONFIRMATION, URINE
6 Acetylmorphine: NEGATIVE ng/mL (ref <10)
Alcohol Metabolites: POSITIVE ng/mL — AB
Amphetamines: NEGATIVE ng/mL (ref <500)
Barbiturates: NEGATIVE ng/mL (ref <300)
Benzodiazepines: NEGATIVE ng/mL (ref <100)
Cocaine Metabolite: NEGATIVE ng/mL (ref <150)
Codeine: NEGATIVE ng/mL (ref <50)
Creatinine: 35.5 mg/dL
Ethyl Glucuronide (ETG): 5477 ng/mL — ABNORMAL HIGH (ref <500)
Ethyl Sulfate (ETS): 1322 ng/mL — ABNORMAL HIGH (ref <100)
Hydrocodone: 378 ng/mL — ABNORMAL HIGH (ref <50)
Hydromorphone: 83 ng/mL — ABNORMAL HIGH (ref <50)
Marijuana Metabolite: NEGATIVE ng/mL (ref <20)
Methadone Metabolite: NEGATIVE ng/mL (ref <100)
Morphine: NEGATIVE ng/mL (ref <50)
Norhydrocodone: 819 ng/mL — ABNORMAL HIGH (ref <50)
Opiates: POSITIVE ng/mL — AB (ref <100)
Oxidant: NEGATIVE ug/mL
Oxycodone: NEGATIVE ng/mL (ref <100)
Phencyclidine: NEGATIVE ng/mL (ref <25)
pH: 6.9 (ref 4.5–9.0)

## 2019-07-24 LAB — DM TEMPLATE

## 2019-07-27 ENCOUNTER — Other Ambulatory Visit: Payer: Self-pay | Admitting: Family Medicine

## 2019-08-16 ENCOUNTER — Other Ambulatory Visit: Payer: Self-pay | Admitting: Family Medicine

## 2019-08-16 NOTE — Telephone Encounter (Signed)
Starter pack sent to pharmacy

## 2019-08-21 ENCOUNTER — Other Ambulatory Visit: Payer: Self-pay

## 2019-08-21 DIAGNOSIS — G8929 Other chronic pain: Secondary | ICD-10-CM

## 2019-08-21 MED ORDER — HYDROCODONE-ACETAMINOPHEN 7.5-325 MG PO TABS: 1.0000 | ORAL_TABLET | Freq: Three times a day (TID) | ORAL | 0 refills | Status: DC | PRN

## 2019-09-17 ENCOUNTER — Ambulatory Visit (INDEPENDENT_AMBULATORY_CARE_PROVIDER_SITE_OTHER): Payer: Medicare Other | Admitting: Family Medicine

## 2019-09-17 ENCOUNTER — Encounter: Payer: Self-pay | Admitting: Family Medicine

## 2019-09-17 VITALS — BP 118/43 | HR 78 | Ht 63.0 in | Wt 222.0 lb

## 2019-09-17 DIAGNOSIS — G8929 Other chronic pain: Secondary | ICD-10-CM | POA: Diagnosis not present

## 2019-09-17 DIAGNOSIS — M48062 Spinal stenosis, lumbar region with neurogenic claudication: Secondary | ICD-10-CM

## 2019-09-17 DIAGNOSIS — Z1231 Encounter for screening mammogram for malignant neoplasm of breast: Secondary | ICD-10-CM

## 2019-09-17 DIAGNOSIS — I10 Essential (primary) hypertension: Secondary | ICD-10-CM

## 2019-09-17 DIAGNOSIS — Z636 Dependent relative needing care at home: Secondary | ICD-10-CM

## 2019-09-17 DIAGNOSIS — I4891 Unspecified atrial fibrillation: Secondary | ICD-10-CM

## 2019-09-17 DIAGNOSIS — Z538 Procedure and treatment not carried out for other reasons: Secondary | ICD-10-CM

## 2019-09-17 MED ORDER — HYDROCODONE-ACETAMINOPHEN 7.5-325 MG PO TABS: 1.0000 | ORAL_TABLET | Freq: Three times a day (TID) | ORAL | 0 refills | Status: DC | PRN

## 2019-09-17 MED ORDER — FUROSEMIDE 40 MG PO TABS: 40.0000 mg | ORAL_TABLET | Freq: Two times a day (BID) | ORAL | 1 refills | Status: DC

## 2019-09-17 MED ORDER — METOPROLOL SUCCINATE ER 50 MG PO TB24
50.0000 mg | ORAL_TABLET | Freq: Every day | ORAL | 1 refills | Status: DC
Start: 2019-09-17 — End: 2020-01-20

## 2019-09-17 NOTE — Progress Notes (Signed)
Pt requested refills for furosemide 40 mg, metoprolol 25 mg, duloxetine 60 mg and xarelto 20 mg.

## 2019-09-17 NOTE — Assessment & Plan Note (Signed)
Blood pressure looks fantastic.  We discussed that the nebivolol and metoprolol both beta-blockers and she really only needs to 1.  She would prefer to have whichever one is least expensive.  Thus we will increase metoprolol from 25 mg to 50 mg and discontinue the nebivolol.  New prescription sent to pharmacy.  Follow-up in 2 months.

## 2019-09-17 NOTE — Assessment & Plan Note (Signed)
Emotionally difficult recently and putting her mother into a nursing home she will be traveling back to [place] to check on her and make sure that she is doing okay.

## 2019-09-17 NOTE — Assessment & Plan Note (Addendum)
Medication refilled today.  We will follow up in 2 months.  Urine drug screen repeated today as her last UDS did show positive for alcohol.  Hydrocodone 7.5/325 1 po TID #90/mo History of lumbar laminectomy with decompression and microdiscectomy December 2017. Additional medications: Piroxicam, Robaxin, Cymbalta  Last UDS date: 09/17/2019 Opioid Treatment Agreement signed (Y/N): Y Opioid Treatment Agreement last reviewed with patient:   NCCSRS reviewed this encounter (include red flags): Yes

## 2019-09-17 NOTE — Assessment & Plan Note (Signed)
Continue Xarelto she still has 1 more refill so does not need it quite yet.

## 2019-09-17 NOTE — Progress Notes (Signed)
Established Patient Office Visit  Subjective:  Patient ID: Kelly Roth, female    DOB: 09-13-1950  Age: 69 y.o. MRN: [id number]  CC:  Chief Complaint  Patient presents with  . pain management    HPI Kelly Roth presents for   Hypertension- Pt denies chest pain, SOB, dizziness, or heart palpitations.  Taking meds as directed w/o problems.  Denies medication side effects.  She needs a refill on furosemide 40 mg twice a day.  We have not written this prescription for her numbers 3 years.  She also needs a refill of metoprolol.  We also did not have this on her active medication list and vitals we had nebivolol 2.5 mg she says she is actually taking both.   F/U chronic Pain management: Doing well on her current regimen she feels like it is effective she reports that her bowels are moving well without any problems.  She says she is due for her refill today.  She has had her Covid vaccinations. Hydrocodone 7.5/325 1 po TID #90/mo History of lumbar laminectomy with decompression and microdiscectomy December 2017. Additional medications:tramadol,Robaxin, Cymbalta  Follow-up caregiver burden-her s family is finally putting her mother in a nursing home.  Her dementia has gotten worse.  And she is not able to care for herself.  She is scheduled for cataract surgery in July with Dr. Rosaria Ferries.  Shee is interested in getting her mammogram done in September so glad in place order today.  Past Medical History:  Diagnosis Date  . Adenomatous polyps 2004    Colonoscopy done in [facility]  . Arthritis   . Back pain   . Complication of anesthesia   . COPD (chronic obstructive pulmonary disease) ([place])   . Dyspnea    pain contributes but also pt. reveals that she has been out of her Spiriva for one month +  . GERD (gastroesophageal reflux disease)   . Hemorrhoids   . Hyperlipidemia   . Hypertension   . Osteoarthritis   . PONV (postoperative nausea and vomiting)     scopolamine patch used with success with a prev. surg.     Past Surgical History:  Procedure Laterality Date  . CHOLECYSTECTOMY  05-08-07  . JOINT REPLACEMENT Bilateral   . left knee replacement  3-07   right on 2-06  . LUMBAR LAMINECTOMY/DECOMPRESSION MICRODISCECTOMY N/A 03/01/2016   Procedure: LEFT AND CENTRAL L3-4 AND LEFT L4-5 DECOMPRESSIVE LUMBAR LAMINECTOMY FOR SPINAL STENOSIS;  Surgeon: Jessy Oto, MD;  Location: [facility];  Service: Orthopedics;  Laterality: N/A;  . MULTIPLE TOOTH EXTRACTIONS      Family History  Problem Relation Age of Onset  . Depression Mother   . Hypertension Mother   . Hyperlipidemia Mother   . COPD Mother   . Lung cancer Mother   . Hyperlipidemia Father   . Hypertension Father   . Colon cancer Neg Hx        Neg     Social History   Socioeconomic History  . Marital status: Married    Spouse name: Not on file  . Number of children: Not on file  . Years of education: Not on file  . Highest education level: Not on file  Occupational History  . Occupation: Caregiver  Tobacco Use  . Smoking status: Current Every Day Smoker    Packs/day: 0.50    Types: Cigarettes  . Smokeless tobacco: Never Used  . Tobacco comment: very rare use, she remarks "I had one cigarette  today, reports thia is the first one in a month"  Vaping Use  . Vaping Use: Never used  Substance and Sexual Activity  . Alcohol use: Yes    Alcohol/week: 0.0 standard drinks    Comment: one drink a week   . Drug use: No  . Sexual activity: Not on file  Other Topics Concern  . 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   Social Determinants of Health   Financial Resource Strain:   . Difficulty of Paying Living Expenses:   Food Insecurity:   . Worried About Charity fundraiser in the Last Year:   . Arboriculturist in the Last Year:   Transportation Needs:   . Film/video editor (Medical):   Marland Kitchen Lack of Transportation  (Non-Medical):   Physical Activity:   . Days of Exercise per Week:   . Minutes of Exercise per Session:   Stress:   . Feeling of Stress :   Social Connections:   . Frequency of Communication with Friends and Family:   . Frequency of Social Gatherings with Friends and Family:   . Attends Religious Services:   . Active Member of Clubs or Organizations:   . Attends Archivist Meetings:   Marland Kitchen Marital Status:   Intimate Partner Violence:   . Fear of Current or Ex-Partner:   . Emotionally Abused:   Marland Kitchen Physically Abused:   . Sexually Abused:     Outpatient Medications Prior to Visit  Medication Sig Dispense Refill  . albuterol (VENTOLIN HFA) 108 (90 Base) MCG/ACT inhaler TAKE 2 PUFFS BY MOUTH EVERY 6 HOURS AS NEEDED FOR WHEEZE OR SHORTNESS OF BREATH 18 g 3  . ANORO ELLIPTA 62.5-25 MCG/INH AEPB INHALE 1 PUFF BY MOUTH EVERY DAY 60 each 6  . DULoxetine (CYMBALTA) 60 MG capsule Take 1 capsule (60 mg total) by mouth daily. 90 capsule 1  . lisinopril-hydrochlorothiazide (ZESTORETIC) 20-12.5 MG tablet Take 1 tablet by mouth daily. 90 tablet 1  . Multiple Vitamin (MULTIVITAMIN) capsule Take 1 capsule by mouth daily.      . rivaroxaban (XARELTO) 20 MG TABS tablet Take 20 mg by mouth daily.    . rosuvastatin (CRESTOR) 40 MG tablet TAKE 1 TABLET BY MOUTH EVERY DAY 90 tablet 3  . furosemide (LASIX) 40 MG tablet Take 40 mg by mouth 2 (two) times daily.    Marland Kitchen HYDROcodone-acetaminophen (NORCO) 7.5-325 MG tablet Take 1 tablet by mouth every 8 (eight) hours as needed for severe pain. 90 tablet 0  . nebivolol (BYSTOLIC) 2.5 MG tablet Take 1 tablet (2.5 mg total) by mouth daily. 90 tablet 1  . AMBULATORY NON FORMULARY MEDICATION Medication Name: Order for overnight pulse oximetry. Patient has moderate COPD. She did not qualify for daytime oxygen but concerned that she may be having drops in oxygen at night. Fax to [facility] 1 Units 0  . varenicline (CHANTIX STARTING MONTH PAK) 0.5 MG X 11 & 1 MG X 42  tablet Take as directed 53 tablet 0   No facility-administered medications prior to visit.    Allergies  Allergen Reactions  . Nsaids Other (See Comments)    ROS Review of Systems    Objective:    Physical Exam Constitutional:      Appearance: She is well-developed.  HENT:     Head: Normocephalic and atraumatic.  Cardiovascular:     Rate and Rhythm: Normal rate and regular rhythm.  Heart sounds: Normal heart sounds.  Pulmonary:     Effort: Pulmonary effort is normal.     Breath sounds: Normal breath sounds.  Skin:    General: Skin is warm and dry.  Neurological:     Mental Status: She is alert and oriented to person, place, and time.  Psychiatric:        Behavior: Behavior normal.     BP (!) 118/43   Pulse 78   Ht 5\' 3"  (1.6 m)   Wt 222 lb (100.7 kg)   SpO2 98%   BMI 39.33 kg/m  Wt Readings from Last 3 Encounters:  09/17/19 222 lb (100.7 kg)  07/22/19 223 lb (101.2 kg)  05/20/19 218 lb 0.6 oz (98.9 kg)     Health Maintenance Due  Topic Date Due  . DEXA SCAN  Never done  . PNA vac Low Risk Adult (2 of 2 - PCV13) 05/14/2019    There are no preventive care reminders to display for this patient.  Lab Results  Component Value Date   TSH 1.75 08/22/2018   Lab Results  Component Value Date   WBC 8.4 05/20/2019   HGB 13.8 05/20/2019   HCT 42.2 05/20/2019   MCV 86.5 05/20/2019   PLT 274 05/20/2019   Lab Results  Component Value Date   NA 134 (L) 05/20/2019   K 4.1 05/20/2019   CO2 25 05/20/2019   GLUCOSE 103 05/20/2019   BUN 20 05/20/2019   CREATININE 0.98 05/20/2019   BILITOT 0.5 05/20/2019   ALKPHOS 95 08/22/2016   AST 16 05/20/2019   ALT 19 05/20/2019   PROT 6.7 05/20/2019   ALBUMIN 4.3 08/22/2016   CALCIUM 10.5 (H) 05/20/2019   ANIONGAP 7 03/02/2016   Lab Results  Component Value Date   CHOL 127 03/12/2018   Lab Results  Component Value Date   HDL 37 (L) 03/12/2018   Lab Results  Component Value Date   LDLCALC 71  03/12/2018   Lab Results  Component Value Date   TRIG 106 03/12/2018   Lab Results  Component Value Date   CHOLHDL 3.4 03/12/2018   Lab Results  Component Value Date   HGBA1C 5.8 (H) 08/22/2018      Assessment & Plan:   Problem List Items Addressed This Visit      Cardiovascular and Mediastinum   HYPERTENSION, BENIGN - Primary    Blood pressure looks fantastic.  We discussed that the nebivolol and metoprolol both beta-blockers and she really only needs to 1.  She would prefer to have whichever one is least expensive.  Thus we will increase metoprolol from 25 mg to 50 mg and discontinue the nebivolol.  New prescription sent to pharmacy.  Follow-up in 2 months.      Relevant Medications   furosemide (LASIX) 40 MG tablet   metoprolol succinate (TOPROL-XL) 50 MG 24 hr tablet   Atrial fibrillation (HCC)    Continue Xarelto she still has 1 more refill so does not need it quite yet.      Relevant Medications   furosemide (LASIX) 40 MG tablet   metoprolol succinate (TOPROL-XL) 50 MG 24 hr tablet     Other   Neurogenic claudication due to lumbar spinal stenosis   Relevant Medications   HYDROcodone-acetaminophen (NORCO) 7.5-325 MG tablet   Encounter for chronic pain management    Medication refilled today.  We will follow up in 2 months.  Urine drug screen repeated today as her last UDS did show positive  for alcohol.  Hydrocodone 7.5/325 1 po TID #90/mo History of lumbar laminectomy with decompression and microdiscectomy December 2017. Additional medications: Piroxicam, Robaxin, Cymbalta  Last UDS date: 09/17/2019 Opioid Treatment Agreement signed (Y/N): Y Opioid Treatment Agreement last reviewed with patient:   NCCSRS reviewed this encounter (include red flags): Yes         Relevant Medications   HYDROcodone-acetaminophen (NORCO) 7.5-325 MG tablet   Other Relevant Orders   DRUG MONITORING, PANEL 6 WITH CONFIRMATION, URINE   Caregiver burden    Emotionally difficult  recently and putting her mother into a nursing home she will be traveling back to [place] to check on her and make sure that she is doing okay.       Other Visit Diagnoses    Mammogram not needed       Screening mammogram, encounter for       Relevant Orders   MM 3D SCREEN BREAST BILATERAL      Order placed for mammogram.  Meds ordered this encounter  Medications  . furosemide (LASIX) 40 MG tablet    Sig: Take 1 tablet (40 mg total) by mouth 2 (two) times daily.    Dispense:  180 tablet    Refill:  1  . HYDROcodone-acetaminophen (NORCO) 7.5-325 MG tablet    Sig: Take 1 tablet by mouth every 8 (eight) hours as needed for severe pain.    Dispense:  90 tablet    Refill:  0  . metoprolol succinate (TOPROL-XL) 50 MG 24 hr tablet    Sig: Take 1 tablet (50 mg total) by mouth daily. Take with or immediately following a meal.    Dispense:  90 tablet    Refill:  1    Follow-up: Return in about 2 months (around 11/18/2019) for chronic pain management.    Beatrice Lecher, MD

## 2019-09-17 NOTE — Patient Instructions (Signed)
Stop the nebivolol.

## 2019-09-19 LAB — DRUG MONITORING, PANEL 6 WITH CONFIRMATION, URINE
6 Acetylmorphine: NEGATIVE ng/mL (ref <10)
Alcohol Metabolites: NEGATIVE ng/mL
Amphetamines: NEGATIVE ng/mL (ref <500)
Barbiturates: NEGATIVE ng/mL (ref <300)
Benzodiazepines: NEGATIVE ng/mL (ref <100)
Cocaine Metabolite: NEGATIVE ng/mL (ref <150)
Codeine: NEGATIVE ng/mL (ref <50)
Creatinine: 26.7 mg/dL
Hydrocodone: 723 ng/mL — ABNORMAL HIGH (ref <50)
Hydromorphone: 146 ng/mL — ABNORMAL HIGH (ref <50)
Marijuana Metabolite: NEGATIVE ng/mL (ref <20)
Methadone Metabolite: NEGATIVE ng/mL (ref <100)
Morphine: NEGATIVE ng/mL (ref <50)
Norhydrocodone: 1757 ng/mL — ABNORMAL HIGH (ref <50)
Opiates: POSITIVE ng/mL — AB (ref <100)
Oxidant: NEGATIVE ug/mL
Oxycodone: NEGATIVE ng/mL (ref <100)
Phencyclidine: NEGATIVE ng/mL (ref <25)
pH: 7.3 (ref 4.5–9.0)

## 2019-09-19 LAB — DM TEMPLATE

## 2019-09-24 DIAGNOSIS — I10 Essential (primary) hypertension: Secondary | ICD-10-CM | POA: Diagnosis not present

## 2019-09-24 DIAGNOSIS — I4891 Unspecified atrial fibrillation: Secondary | ICD-10-CM | POA: Diagnosis not present

## 2019-09-24 DIAGNOSIS — H40033 Anatomical narrow angle, bilateral: Secondary | ICD-10-CM | POA: Diagnosis not present

## 2019-09-24 DIAGNOSIS — H527 Unspecified disorder of refraction: Secondary | ICD-10-CM | POA: Diagnosis not present

## 2019-09-24 DIAGNOSIS — H25811 Combined forms of age-related cataract, right eye: Secondary | ICD-10-CM | POA: Diagnosis not present

## 2019-09-24 DIAGNOSIS — H02831 Dermatochalasis of right upper eyelid: Secondary | ICD-10-CM | POA: Diagnosis not present

## 2019-09-24 DIAGNOSIS — F17200 Nicotine dependence, unspecified, uncomplicated: Secondary | ICD-10-CM | POA: Diagnosis not present

## 2019-09-24 DIAGNOSIS — J449 Chronic obstructive pulmonary disease, unspecified: Secondary | ICD-10-CM | POA: Diagnosis not present

## 2019-09-24 DIAGNOSIS — H52203 Unspecified astigmatism, bilateral: Secondary | ICD-10-CM | POA: Diagnosis not present

## 2019-09-24 DIAGNOSIS — H25813 Combined forms of age-related cataract, bilateral: Secondary | ICD-10-CM | POA: Diagnosis not present

## 2019-09-24 DIAGNOSIS — H02834 Dermatochalasis of left upper eyelid: Secondary | ICD-10-CM | POA: Diagnosis not present

## 2019-09-24 DIAGNOSIS — Z6839 Body mass index (BMI) 39.0-39.9, adult: Secondary | ICD-10-CM | POA: Diagnosis not present

## 2019-09-24 DIAGNOSIS — E785 Hyperlipidemia, unspecified: Secondary | ICD-10-CM | POA: Diagnosis not present

## 2019-10-01 DIAGNOSIS — E785 Hyperlipidemia, unspecified: Secondary | ICD-10-CM | POA: Diagnosis not present

## 2019-10-01 DIAGNOSIS — I10 Essential (primary) hypertension: Secondary | ICD-10-CM | POA: Diagnosis not present

## 2019-10-01 DIAGNOSIS — Z7901 Long term (current) use of anticoagulants: Secondary | ICD-10-CM | POA: Diagnosis not present

## 2019-10-01 DIAGNOSIS — F17200 Nicotine dependence, unspecified, uncomplicated: Secondary | ICD-10-CM | POA: Diagnosis not present

## 2019-10-01 DIAGNOSIS — I428 Other cardiomyopathies: Secondary | ICD-10-CM | POA: Diagnosis not present

## 2019-10-01 DIAGNOSIS — H40033 Anatomical narrow angle, bilateral: Secondary | ICD-10-CM | POA: Diagnosis not present

## 2019-10-01 DIAGNOSIS — J449 Chronic obstructive pulmonary disease, unspecified: Secondary | ICD-10-CM | POA: Diagnosis not present

## 2019-10-01 DIAGNOSIS — I4891 Unspecified atrial fibrillation: Secondary | ICD-10-CM | POA: Diagnosis not present

## 2019-10-01 DIAGNOSIS — H02831 Dermatochalasis of right upper eyelid: Secondary | ICD-10-CM | POA: Diagnosis not present

## 2019-10-01 DIAGNOSIS — M1990 Unspecified osteoarthritis, unspecified site: Secondary | ICD-10-CM | POA: Diagnosis not present

## 2019-10-01 DIAGNOSIS — H52203 Unspecified astigmatism, bilateral: Secondary | ICD-10-CM | POA: Diagnosis not present

## 2019-10-01 DIAGNOSIS — H02834 Dermatochalasis of left upper eyelid: Secondary | ICD-10-CM | POA: Diagnosis not present

## 2019-10-01 DIAGNOSIS — F17210 Nicotine dependence, cigarettes, uncomplicated: Secondary | ICD-10-CM | POA: Diagnosis not present

## 2019-10-01 DIAGNOSIS — H25812 Combined forms of age-related cataract, left eye: Secondary | ICD-10-CM | POA: Diagnosis not present

## 2019-10-01 DIAGNOSIS — Z6839 Body mass index (BMI) 39.0-39.9, adult: Secondary | ICD-10-CM | POA: Diagnosis not present

## 2019-10-02 DIAGNOSIS — H40033 Anatomical narrow angle, bilateral: Secondary | ICD-10-CM | POA: Diagnosis not present

## 2019-10-02 DIAGNOSIS — Z961 Presence of intraocular lens: Secondary | ICD-10-CM | POA: Diagnosis not present

## 2019-10-21 ENCOUNTER — Other Ambulatory Visit: Payer: Self-pay

## 2019-10-21 DIAGNOSIS — G8929 Other chronic pain: Secondary | ICD-10-CM

## 2019-10-21 MED ORDER — HYDROCODONE-ACETAMINOPHEN 7.5-325 MG PO TABS: 1.0000 | ORAL_TABLET | Freq: Three times a day (TID) | ORAL | 0 refills | Status: DC | PRN

## 2019-10-21 NOTE — Telephone Encounter (Signed)
Laquan needs a refill on Hydrocodone.

## 2019-11-15 ENCOUNTER — Other Ambulatory Visit: Payer: Self-pay | Admitting: Family Medicine

## 2019-11-15 DIAGNOSIS — G8929 Other chronic pain: Secondary | ICD-10-CM

## 2019-11-19 ENCOUNTER — Encounter: Payer: Self-pay | Admitting: Family Medicine

## 2019-11-19 ENCOUNTER — Other Ambulatory Visit: Payer: Self-pay

## 2019-11-19 ENCOUNTER — Ambulatory Visit (INDEPENDENT_AMBULATORY_CARE_PROVIDER_SITE_OTHER): Payer: Medicare Other | Admitting: Family Medicine

## 2019-11-19 VITALS — BP 118/71 | HR 70 | Ht 63.0 in | Wt 231.0 lb

## 2019-11-19 DIAGNOSIS — J449 Chronic obstructive pulmonary disease, unspecified: Secondary | ICD-10-CM | POA: Diagnosis not present

## 2019-11-19 DIAGNOSIS — I10 Essential (primary) hypertension: Secondary | ICD-10-CM

## 2019-11-19 DIAGNOSIS — G8929 Other chronic pain: Secondary | ICD-10-CM | POA: Diagnosis not present

## 2019-11-19 DIAGNOSIS — R7301 Impaired fasting glucose: Secondary | ICD-10-CM | POA: Diagnosis not present

## 2019-11-19 LAB — POCT GLYCOSYLATED HEMOGLOBIN (HGB A1C): Hemoglobin A1C: 5.8 % — AB (ref 4.0–5.6)

## 2019-11-19 MED ORDER — HYDROCODONE-ACETAMINOPHEN 7.5-325 MG PO TABS: 1.0000 | ORAL_TABLET | Freq: Three times a day (TID) | ORAL | 0 refills | Status: DC | PRN

## 2019-11-19 NOTE — Progress Notes (Signed)
Established Patient Office Visit  Subjective:  Patient ID: Kelly Roth, female    DOB: 10-01-1950  Age: 69 y.o. MRN: [id number]  CC:  Chief Complaint  Patient presents with  . pain management    HPI Kelly Roth presents for   F/U chronic Pain management: Doing well on her current regimen she feels like it is effective she reports that her bowels are moving well without any problems.  She says she is due for her refill today.    Hydrocodone 7.5/325 1 po TID #90/mo History of lumbar laminectomy with decompression and microdiscectomy December 2017. Additional medications:tramadol,Robaxin, Cymbalta  Follow-up hypertension-somehow inadvertently she had ended up on 2 different beta-blockers.  So when I last saw her 2 months ago we discontinued 1 and actually increase the metoprolol.  Impaired fasting glucose-no increased thirst or urination. No symptoms consistent with hypoglycemia. Seeing Dr   Past Medical History:  Diagnosis Date  . Adenomatous polyps 2004    Colonoscopy done in [facility]  . Arthritis   . Back pain   . Complication of anesthesia   . COPD (chronic obstructive pulmonary disease) ([place])   . Dyspnea    pain contributes but also pt. reveals that she has been out of her Spiriva for one month +  . GERD (gastroesophageal reflux disease)   . Hemorrhoids   . Hyperlipidemia   . Hypertension   . Osteoarthritis   . PONV (postoperative nausea and vomiting)    scopolamine patch used with success with a prev. surg.     Past Surgical History:  Procedure Laterality Date  . CHOLECYSTECTOMY  05-08-07  . JOINT REPLACEMENT Bilateral   . left knee replacement  3-07   right on 2-06  . LUMBAR LAMINECTOMY/DECOMPRESSION MICRODISCECTOMY N/A 03/01/2016   Procedure: LEFT AND CENTRAL L3-4 AND LEFT L4-5 DECOMPRESSIVE LUMBAR LAMINECTOMY FOR SPINAL STENOSIS;  Surgeon: Jessy Oto, MD;  Location: [place];  Service: Orthopedics;  Laterality: N/A;  . MULTIPLE TOOTH  EXTRACTIONS      Family History  Problem Relation Age of Onset  . Depression Mother   . Hypertension Mother   . Hyperlipidemia Mother   . COPD Mother   . Lung cancer Mother   . Hyperlipidemia Father   . Hypertension Father   . Colon cancer Neg Hx        Neg     Social History   Socioeconomic History  . Marital status: Married    Spouse name: Not on file  . Number of children: Not on file  . Years of education: Not on file  . Highest education level: Not on file  Occupational History  . Occupation: Caregiver  Tobacco Use  . Smoking status: Current Every Day Smoker    Packs/day: 0.50    Types: Cigarettes  . Smokeless tobacco: Never Used  . Tobacco comment: very rare use, she remarks "I had one cigarette today, reports thia is the first one in a month"  Vaping Use  . Vaping Use: Never used  Substance and Sexual Activity  . Alcohol use: Yes    Alcohol/week: 0.0 standard drinks    Comment: one drink a week   . Drug use: No  . Sexual activity: Not on file  Other Topics Concern  . 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   Social Determinants of Health   Financial Resource Strain:   . Difficulty of Paying Living Expenses: Not on  file  Food Insecurity:   . Worried About Charity fundraiser in the Last Year: Not on file  . Ran Out of Food in the Last Year: Not on file  Transportation Needs:   . Lack of Transportation (Medical): Not on file  . Lack of Transportation (Non-Medical): Not on file  Physical Activity:   . Days of Exercise per Week: Not on file  . Minutes of Exercise per Session: Not on file  Stress:   . Feeling of Stress : Not on file  Social Connections:   . Frequency of Communication with Friends and Family: Not on file  . Frequency of Social Gatherings with Friends and Family: Not on file  . Attends Religious Services: Not on file  . Active Member of Clubs or Organizations: Not on file  . Attends English as a second language teacher Meetings: Not on file  . Marital Status: Not on file  Intimate Partner Violence:   . Fear of Current or Ex-Partner: Not on file  . Emotionally Abused: Not on file  . Physically Abused: Not on file  . Sexually Abused: Not on file    Outpatient Medications Prior to Visit  Medication Sig Dispense Refill  . albuterol (VENTOLIN HFA) 108 (90 Base) MCG/ACT inhaler TAKE 2 PUFFS BY MOUTH EVERY 6 HOURS AS NEEDED FOR WHEEZE OR SHORTNESS OF BREATH 18 g 3  . DULoxetine (CYMBALTA) 60 MG capsule TAKE 1 CAPSULE BY MOUTH EVERY DAY 90 capsule 1  . furosemide (LASIX) 40 MG tablet Take 1 tablet (40 mg total) by mouth 2 (two) times daily. 180 tablet 1  . lisinopril-hydrochlorothiazide (ZESTORETIC) 20-12.5 MG tablet Take 1 tablet by mouth daily. 90 tablet 1  . metoprolol succinate (TOPROL-XL) 50 MG 24 hr tablet Take 1 tablet (50 mg total) by mouth daily. Take with or immediately following a meal. 90 tablet 1  . Multiple Vitamin (MULTIVITAMIN) capsule Take 1 capsule by mouth daily.      . rivaroxaban (XARELTO) 20 MG TABS tablet Take 20 mg by mouth daily.    . rosuvastatin (CRESTOR) 40 MG tablet TAKE 1 TABLET BY MOUTH EVERY DAY 90 tablet 3  . ANORO ELLIPTA 62.5-25 MCG/INH AEPB INHALE 1 PUFF BY MOUTH EVERY DAY 60 each 6  . HYDROcodone-acetaminophen (NORCO) 7.5-325 MG tablet Take 1 tablet by mouth every 8 (eight) hours as needed for severe pain. 90 tablet 0   No facility-administered medications prior to visit.    Allergies  Allergen Reactions  . Nsaids Other (See Comments)    ROS Review of Systems    Objective:    Physical Exam Constitutional:      Appearance: She is well-developed.  HENT:     Head: Normocephalic and atraumatic.  Cardiovascular:     Rate and Rhythm: Normal rate and regular rhythm.     Heart sounds: Normal heart sounds.  Pulmonary:     Effort: Pulmonary effort is normal.     Breath sounds: Normal breath sounds.  Skin:    General: Skin is warm and dry.   Neurological:     Mental Status: She is alert and oriented to person, place, and time.  Psychiatric:        Behavior: Behavior normal.     BP 118/71   Pulse 70   Ht 5\' 3"  (1.6 m)   Wt 231 lb (104.8 kg)   SpO2 97%   BMI 40.92 kg/m  Wt Readings from Last 3 Encounters:  11/19/19 231 lb (104.8 kg)  09/17/19 222  lb (100.7 kg)  07/22/19 223 lb (101.2 kg)     Health Maintenance Due  Topic Date Due  . DEXA SCAN  Never done  . PNA vac Low Risk Adult (2 of 2 - PCV13) 05/14/2019  . INFLUENZA VACCINE  10/13/2019    There are no preventive care reminders to display for this patient.  Lab Results  Component Value Date   TSH 1.75 08/22/2018   Lab Results  Component Value Date   WBC 8.4 05/20/2019   HGB 13.8 05/20/2019   HCT 42.2 05/20/2019   MCV 86.5 05/20/2019   PLT 274 05/20/2019   Lab Results  Component Value Date   NA 134 (L) 05/20/2019   K 4.1 05/20/2019   CO2 25 05/20/2019   GLUCOSE 103 05/20/2019   BUN 20 05/20/2019   CREATININE 0.98 05/20/2019   BILITOT 0.5 05/20/2019   ALKPHOS 95 08/22/2016   AST 16 05/20/2019   ALT 19 05/20/2019   PROT 6.7 05/20/2019   ALBUMIN 4.3 08/22/2016   CALCIUM 10.5 (H) 05/20/2019   ANIONGAP 7 03/02/2016   Lab Results  Component Value Date   CHOL 127 03/12/2018   Lab Results  Component Value Date   HDL 37 (L) 03/12/2018   Lab Results  Component Value Date   LDLCALC 71 03/12/2018   Lab Results  Component Value Date   TRIG 106 03/12/2018   Lab Results  Component Value Date   CHOLHDL 3.4 03/12/2018   Lab Results  Component Value Date   HGBA1C 5.8 (A) 11/19/2019      Assessment & Plan:   Problem List Items Addressed This Visit      Cardiovascular and Mediastinum   HYPERTENSION, BENIGN    Well controlled. Continue current regimen. Follow up in  6 o      Relevant Orders   CBC   COMPLETE METABOLIC PANEL WITH GFR   Lipid panel     Respiratory   Chronic obstructive pulmonary disease (HCC)    No recent  flares or exacerbations.  Reports that she uses her Anoro regularly she does continue to smoke unfortunately.      Relevant Medications   umeclidinium-vilanterol (ANORO ELLIPTA) 62.5-25 MCG/INH AEPB     Endocrine   IFG (impaired fasting glucose)    A1C see looks great today at 5.8.  Continue current regimen.      Relevant Orders   POCT glycosylated hemoglobin (Hb A1C) (Completed)   CBC   COMPLETE METABOLIC PANEL WITH GFR   Lipid panel     Other   Encounter for chronic pain management - Primary    She requested to go to [facility] for testing which I think is reasonable though I do want to keep an eye on the fact that she has had a previous drug screen showing positive for alcohol she did report that she drank alcohol again this time.  Did remind her and warned her that mixing this with chronic narcotics is dangerous and that she should really avoid all alcohol.  She also requested that we increase her dose.  At this point without additional work-up and/or intervention we will hold off, and continue with current regimen.  Hydrocodone 7.5/325 1 po TID #90/mo History of lumbar laminectomy with decompression and microdiscectomy December 2017. Additional medications: Piroxicam, Robaxin, Cymbalta Last UDS date: 11/19/2019 Opioid Treatment Agreement signed (Y/N): Y Opioid Treatment Agreement last reviewed with patient:   NCCSRS reviewed this encounter (include red flags): Yes  Relevant Medications   HYDROcodone-acetaminophen (NORCO) 7.5-325 MG tablet   Other Relevant Orders   DRUG MONITORING, PANEL 6 WITH CONFIRMATION, URINE      Meds ordered this encounter  Medications  . HYDROcodone-acetaminophen (NORCO) 7.5-325 MG tablet    Sig: Take 1 tablet by mouth every 8 (eight) hours as needed for severe pain.    Dispense:  90 tablet    Refill:  0  . umeclidinium-vilanterol (ANORO ELLIPTA) 62.5-25 MCG/INH AEPB    Sig: INHALE 1 PUFF BY MOUTH EVERY DAY    Dispense:  60 each    Refill:   6    Follow-up: Return in about 3 months (around 02/18/2020) for Pain management.  Beatrice Lecher, MD

## 2019-11-19 NOTE — Assessment & Plan Note (Addendum)
She requested to go to [facility] for testing which I think is reasonable though I do want to keep an eye on the fact that she has had a previous drug screen showing positive for alcohol she did report that she drank alcohol again this time.  Did remind her and warned her that mixing this with chronic narcotics is dangerous and that she should really avoid all alcohol.  She also requested that we increase her dose.  At this point without additional work-up and/or intervention we will hold off, and continue with current regimen.  Hydrocodone 7.5/325 1 po TID #90/mo History of lumbar laminectomy with decompression and microdiscectomy December 2017. Additional medications: Piroxicam, Robaxin, Cymbalta Last UDS date: 11/19/2019 Opioid Treatment Agreement signed (Y/N): Y Opioid Treatment Agreement last reviewed with patient:   NCCSRS reviewed this encounter (include red flags): Yes

## 2019-11-19 NOTE — Assessment & Plan Note (Signed)
A1C see looks great today at 5.8.  Continue current regimen.

## 2019-11-19 NOTE — Progress Notes (Signed)
Pt did her UDS today and informed me that she DID have some Bailey's Irish Cream in her coffee this morning. This is the kind that is NOT a coffee creamer.

## 2019-11-20 ENCOUNTER — Encounter: Payer: Self-pay | Admitting: Family Medicine

## 2019-11-20 MED ORDER — ANORO ELLIPTA 62.5-25 MCG/INH IN AEPB: INHALATION_SPRAY | RESPIRATORY_TRACT | 6 refills | Status: DC

## 2019-11-20 NOTE — Assessment & Plan Note (Signed)
No recent flares or exacerbations.  Reports that she uses her Anoro regularly she does continue to smoke unfortunately.

## 2019-11-20 NOTE — Assessment & Plan Note (Signed)
Well controlled. Continue current regimen. Follow up in  6 o

## 2019-11-21 DIAGNOSIS — R7301 Impaired fasting glucose: Secondary | ICD-10-CM | POA: Diagnosis not present

## 2019-11-21 DIAGNOSIS — I10 Essential (primary) hypertension: Secondary | ICD-10-CM | POA: Diagnosis not present

## 2019-11-21 LAB — COMPLETE METABOLIC PANEL WITH GFR
AG Ratio: 1.6 (calc) (ref 1.0–2.5)
ALT: 57 U/L — ABNORMAL HIGH (ref 6–29)
AST: 18 U/L (ref 10–35)
Albumin: 3.9 g/dL (ref 3.6–5.1)
Alkaline phosphatase (APISO): 97 U/L (ref 37–153)
BUN: 21 mg/dL (ref 7–25)
CO2: 30 mmol/L (ref 20–32)
Calcium: 10 mg/dL (ref 8.6–10.4)
Chloride: 103 mmol/L (ref 98–110)
Creat: 0.91 mg/dL (ref 0.50–0.99)
GFR, Est African American: 76 mL/min/{1.73_m2} (ref >=60)
GFR, Est Non African American: 66 mL/min/{1.73_m2} (ref >=60)
Globulin: 2.5 g/dL (calc) (ref 1.9–3.7)
Glucose, Bld: 100 mg/dL — ABNORMAL HIGH (ref 65–99)
Potassium: 3.7 mmol/L (ref 3.5–5.3)
Sodium: 138 mmol/L (ref 135–146)
Total Bilirubin: 0.6 mg/dL (ref 0.2–1.2)
Total Protein: 6.4 g/dL (ref 6.1–8.1)

## 2019-11-21 LAB — CBC
HCT: 45 % (ref 35.0–45.0)
Hemoglobin: 15.4 g/dL (ref 11.7–15.5)
MCH: 32.4 pg (ref 27.0–33.0)
MCHC: 34.2 g/dL (ref 32.0–36.0)
MCV: 94.7 fL (ref 80.0–100.0)
MPV: 10.7 fL (ref 7.5–12.5)
Platelets: 142 10*3/uL (ref 140–400)
RBC: 4.75 10*6/uL (ref 3.80–5.10)
RDW: 12.9 % (ref 11.0–15.0)
WBC: 7.4 10*3/uL (ref 3.8–10.8)

## 2019-11-21 LAB — DRUG MONITORING, PANEL 6 WITH CONFIRMATION, URINE
6 Acetylmorphine: NEGATIVE ng/mL (ref <10)
Alcohol Metabolites: POSITIVE ng/mL — AB
Amphetamines: NEGATIVE ng/mL (ref <500)
Barbiturates: NEGATIVE ng/mL (ref <300)
Benzodiazepines: NEGATIVE ng/mL (ref <100)
Cocaine Metabolite: NEGATIVE ng/mL (ref <150)
Codeine: NEGATIVE ng/mL (ref <50)
Creatinine: 71.1 mg/dL
Ethyl Glucuronide (ETG): 7260 ng/mL — ABNORMAL HIGH (ref <500)
Ethyl Sulfate (ETS): 1482 ng/mL — ABNORMAL HIGH (ref <100)
Hydrocodone: 1189 ng/mL — ABNORMAL HIGH (ref <50)
Hydromorphone: 173 ng/mL — ABNORMAL HIGH (ref <50)
Marijuana Metabolite: NEGATIVE ng/mL (ref <20)
Methadone Metabolite: NEGATIVE ng/mL (ref <100)
Morphine: NEGATIVE ng/mL (ref <50)
Norhydrocodone: 3155 ng/mL — ABNORMAL HIGH (ref <50)
Opiates: POSITIVE ng/mL — AB (ref <100)
Oxidant: NEGATIVE ug/mL
Oxycodone: NEGATIVE ng/mL (ref <100)
Phencyclidine: NEGATIVE ng/mL (ref <25)
pH: 6.5 (ref 4.5–9.0)

## 2019-11-21 LAB — DM TEMPLATE

## 2019-11-21 LAB — LIPID PANEL
Cholesterol: 137 mg/dL (ref <200)
HDL: 47 mg/dL — ABNORMAL LOW (ref >=50)
LDL Cholesterol (Calc): 72 mg/dL (calc)
Non-HDL Cholesterol (Calc): 90 mg/dL (calc) (ref <130)
Total CHOL/HDL Ratio: 2.9 (calc) (ref <5.0)
Triglycerides: 99 mg/dL (ref <150)

## 2019-12-05 ENCOUNTER — Other Ambulatory Visit: Payer: Self-pay | Admitting: Family Medicine

## 2019-12-19 ENCOUNTER — Other Ambulatory Visit: Payer: Self-pay

## 2019-12-19 DIAGNOSIS — G8929 Other chronic pain: Secondary | ICD-10-CM

## 2019-12-19 MED ORDER — HYDROCODONE-ACETAMINOPHEN 7.5-325 MG PO TABS: 1.0000 | ORAL_TABLET | Freq: Three times a day (TID) | ORAL | 0 refills | Status: DC | PRN

## 2020-01-13 DIAGNOSIS — Z23 Encounter for immunization: Secondary | ICD-10-CM | POA: Diagnosis not present

## 2020-01-19 ENCOUNTER — Other Ambulatory Visit: Payer: Self-pay | Admitting: Family Medicine

## 2020-01-20 ENCOUNTER — Other Ambulatory Visit: Payer: Self-pay

## 2020-01-20 DIAGNOSIS — G8929 Other chronic pain: Secondary | ICD-10-CM

## 2020-01-20 MED ORDER — HYDROCODONE-ACETAMINOPHEN 7.5-325 MG PO TABS: 1.0000 | ORAL_TABLET | Freq: Three times a day (TID) | ORAL | 0 refills | Status: DC | PRN

## 2020-02-03 DIAGNOSIS — Z20822 Contact with and (suspected) exposure to covid-19: Secondary | ICD-10-CM | POA: Diagnosis not present

## 2020-02-18 ENCOUNTER — Other Ambulatory Visit: Payer: Self-pay

## 2020-02-18 ENCOUNTER — Encounter: Payer: Self-pay | Admitting: Family Medicine

## 2020-02-18 ENCOUNTER — Ambulatory Visit (INDEPENDENT_AMBULATORY_CARE_PROVIDER_SITE_OTHER): Payer: Medicare Other | Admitting: Family Medicine

## 2020-02-18 ENCOUNTER — Ambulatory Visit (INDEPENDENT_AMBULATORY_CARE_PROVIDER_SITE_OTHER): Payer: Medicare Other

## 2020-02-18 VITALS — BP 124/72 | HR 87 | Ht 63.0 in | Wt 234.0 lb

## 2020-02-18 DIAGNOSIS — R0602 Shortness of breath: Secondary | ICD-10-CM | POA: Diagnosis not present

## 2020-02-18 DIAGNOSIS — M25511 Pain in right shoulder: Secondary | ICD-10-CM

## 2020-02-18 DIAGNOSIS — J449 Chronic obstructive pulmonary disease, unspecified: Secondary | ICD-10-CM

## 2020-02-18 DIAGNOSIS — I10 Essential (primary) hypertension: Secondary | ICD-10-CM | POA: Diagnosis not present

## 2020-02-18 DIAGNOSIS — S42001A Fracture of unspecified part of right clavicle, initial encounter for closed fracture: Secondary | ICD-10-CM | POA: Diagnosis not present

## 2020-02-18 DIAGNOSIS — G8929 Other chronic pain: Secondary | ICD-10-CM

## 2020-02-18 MED ORDER — ALBUTEROL SULFATE HFA 108 (90 BASE) MCG/ACT IN AERS: INHALATION_SPRAY | RESPIRATORY_TRACT | 3 refills | Status: DC

## 2020-02-18 MED ORDER — HYDROCODONE-ACETAMINOPHEN 7.5-325 MG PO TABS: 1.0000 | ORAL_TABLET | Freq: Three times a day (TID) | ORAL | 0 refills | Status: DC | PRN

## 2020-02-18 NOTE — Assessment & Plan Note (Signed)
Hydrocodone 7.5/325 1 po TID #90/mo History of lumbar laminectomy with decompression and microdiscectomy December 2017. Additional medications: Piroxicam, Robaxin, Cymbalta Last UDS date:02/18/2020 - monitor for alcohol Opioid Treatment Agreement signed (Y/N):02/18/2020 Opioid Treatment Agreement last reviewed with patient:  NCCSRS reviewed this encounter (include red flags):Yes 

## 2020-02-18 NOTE — Progress Notes (Signed)
Established Patient Office Visit  Subjective:  Patient ID: Kelly Roth, female    DOB: December 14, 1950  Age: 69 y.o. MRN: [id number]  CC:  Chief Complaint  Patient presents with  . Pain Management    HPI AVRI PAIVA presents for chronic pain management. No recent changes taking medications regularly. Due for updated pain contract today.  She is been struggling a little bit more emotionally going back and forth to [place] to help clean her mom's house out. Her mom is currently in a nursing home and she says that she knows she is doing the right thing but emotionally is also been very difficult.  Complains of right shoulder pain has been present for about 3 months ever since she fell out of bed. She says she is noticed a little bit harder to reach up but she is able to do it. She never received any treatment for that shoulder. She denies any numbness or tingling down the arm or into the hand. She says occasionally she will hear a grinding noise in the shoulder. Most of the discomfort is on the top of the shoulder. Does not usually sleep on that shoulder.  Would like to get her mammogram scheduled but says that no one has ever contacted her.  Past Medical History:  Diagnosis Date  . Adenomatous polyps 2004    Colonoscopy done in [facility]  . Arthritis   . Back pain   . Complication of anesthesia   . COPD (chronic obstructive pulmonary disease) ([place])   . Dyspnea    pain contributes but also pt. reveals that she has been out of her Spiriva for one month +  . GERD (gastroesophageal reflux disease)   . Hemorrhoids   . Hyperlipidemia   . Hypertension   . Osteoarthritis   . PONV (postoperative nausea and vomiting)    scopolamine patch used with success with a prev. surg.     Past Surgical History:  Procedure Laterality Date  . CHOLECYSTECTOMY  05-08-07  . JOINT REPLACEMENT Bilateral   . left knee replacement  3-07   right on 2-06  . LUMBAR  LAMINECTOMY/DECOMPRESSION MICRODISCECTOMY N/A 03/01/2016   Procedure: LEFT AND CENTRAL L3-4 AND LEFT L4-5 DECOMPRESSIVE LUMBAR LAMINECTOMY FOR SPINAL STENOSIS;  Surgeon: Jessy Oto, MD;  Location: [facility];  Service: Orthopedics;  Laterality: N/A;  . MULTIPLE TOOTH EXTRACTIONS      Family History  Problem Relation Age of Onset  . Depression Mother   . Hypertension Mother   . Hyperlipidemia Mother   . COPD Mother   . Lung cancer Mother   . Hyperlipidemia Father   . Hypertension Father   . Colon cancer Neg Hx        Neg     Social History   Socioeconomic History  . Marital status: Married    Spouse name: Not on file  . Number of children: Not on file  . Years of education: Not on file  . Highest education level: Not on file  Occupational History  . Occupation: Caregiver  Tobacco Use  . Smoking status: Current Every Day Smoker    Packs/day: 0.50    Types: Cigarettes  . Smokeless tobacco: Never Used  . Tobacco comment: very rare use, she remarks "I had one cigarette today, reports thia is the first one in a month"  Vaping Use  . Vaping Use: Never used  Substance and Sexual Activity  . Alcohol use: Yes    Alcohol/week: 0.0 standard  drinks    Comment: one drink a week   . Drug use: No  . Sexual activity: Not on file  Other Topics Concern  . 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   Social Determinants of Health   Financial Resource Strain:   . Difficulty of Paying Living Expenses: Not on file  Food Insecurity:   . Worried About Charity fundraiser in the Last Year: Not on file  . Ran Out of Food in the Last Year: Not on file  Transportation Needs:   . Lack of Transportation (Medical): Not on file  . Lack of Transportation (Non-Medical): Not on file  Physical Activity:   . Days of Exercise per Week: Not on file  . Minutes of Exercise per Session: Not on file  Stress:   . Feeling of Stress : Not on file  Social  Connections:   . Frequency of Communication with Friends and Family: Not on file  . Frequency of Social Gatherings with Friends and Family: Not on file  . Attends Religious Services: Not on file  . Active Member of Clubs or Organizations: Not on file  . Attends Archivist Meetings: Not on file  . Marital Status: Not on file  Intimate Partner Violence:   . Fear of Current or Ex-Partner: Not on file  . Emotionally Abused: Not on file  . Physically Abused: Not on file  . Sexually Abused: Not on file    Outpatient Medications Prior to Visit  Medication Sig Dispense Refill  . DULoxetine (CYMBALTA) 60 MG capsule TAKE 1 CAPSULE BY MOUTH EVERY DAY 90 capsule 1  . furosemide (LASIX) 40 MG tablet Take 1 tablet (40 mg total) by mouth 2 (two) times daily. 180 tablet 1  . lisinopril-hydrochlorothiazide (ZESTORETIC) 20-12.5 MG tablet TAKE 1 TABLET BY MOUTH EVERY DAY 90 tablet 1  . metoprolol succinate (TOPROL-XL) 50 MG 24 hr tablet TAKE 1 TABLET (50 MG TOTAL) BY MOUTH DAILY. TAKE WITH OR IMMEDIATELY FOLLOWING A MEAL. 90 tablet 1  . Multiple Vitamin (MULTIVITAMIN) capsule Take 1 capsule by mouth daily.      . rivaroxaban (XARELTO) 20 MG TABS tablet Take 20 mg by mouth daily.    . rosuvastatin (CRESTOR) 40 MG tablet TAKE 1 TABLET BY MOUTH EVERY DAY 90 tablet 3  . umeclidinium-vilanterol (ANORO ELLIPTA) 62.5-25 MCG/INH AEPB INHALE 1 PUFF BY MOUTH EVERY DAY 60 each 6  . albuterol (VENTOLIN HFA) 108 (90 Base) MCG/ACT inhaler TAKE 2 PUFFS BY MOUTH EVERY 6 HOURS AS NEEDED FOR WHEEZE OR SHORTNESS OF BREATH 18 g 3  . HYDROcodone-acetaminophen (NORCO) 7.5-325 MG tablet Take 1 tablet by mouth every 8 (eight) hours as needed for severe pain. 90 tablet 0   No facility-administered medications prior to visit.    Allergies  Allergen Reactions  . Nsaids Other (See Comments)    ROS Review of Systems    Objective:    Physical Exam Constitutional:      Appearance: She is well-developed.  HENT:      Head: Normocephalic and atraumatic.  Cardiovascular:     Rate and Rhythm: Normal rate and regular rhythm.     Heart sounds: Normal heart sounds.  Pulmonary:     Effort: Pulmonary effort is normal.     Breath sounds: Normal breath sounds.  Musculoskeletal:     Comments: Shoulder with normal range of motion but significant discomfort elevating the shoulder past 90 degrees. She  has some limitation with internal rotation reaching to her low back. Normal crossover but discomfort present. Nontender over the shoulder joint itself. Strength is 5 out of 5. Wrist and elbow strength is also 5 out of 5.  Skin:    General: Skin is warm and dry.  Neurological:     Mental Status: She is alert and oriented to person, place, and time.  Psychiatric:        Behavior: Behavior normal.     BP 124/72   Pulse 87   Ht 5\' 3"  (1.6 m)   Wt 234 lb (106.1 kg)   SpO2 95%   BMI 41.45 kg/m  Wt Readings from Last 3 Encounters:  02/18/20 234 lb (106.1 kg)  11/19/19 231 lb (104.8 kg)  09/17/19 222 lb (100.7 kg)     Health Maintenance Due  Topic Date Due  . COLONOSCOPY  02/20/2018  . DEXA SCAN  Never done  . MAMMOGRAM  09/02/2018  . PNA vac Low Risk Adult (2 of 2 - PCV13) 05/14/2019  . INFLUENZA VACCINE  10/13/2019    There are no preventive care reminders to display for this patient.  Lab Results  Component Value Date   TSH 1.75 08/22/2018   Lab Results  Component Value Date   WBC 7.4 11/21/2019   HGB 15.4 11/21/2019   HCT 45.0 11/21/2019   MCV 94.7 11/21/2019   PLT 142 11/21/2019   Lab Results  Component Value Date   NA 138 11/21/2019   K 3.7 11/21/2019   CO2 30 11/21/2019   GLUCOSE 100 (H) 11/21/2019   BUN 21 11/21/2019   CREATININE 0.91 11/21/2019   BILITOT 0.6 11/21/2019   ALKPHOS 95 08/22/2016   AST 18 11/21/2019   ALT 57 (H) 11/21/2019   PROT 6.4 11/21/2019   ALBUMIN 4.3 08/22/2016   CALCIUM 10.0 11/21/2019   ANIONGAP 7 03/02/2016   Lab Results  Component Value Date    CHOL 137 11/21/2019   Lab Results  Component Value Date   HDL 47 (L) 11/21/2019   Lab Results  Component Value Date   LDLCALC 72 11/21/2019   Lab Results  Component Value Date   TRIG 99 11/21/2019   Lab Results  Component Value Date   CHOLHDL 2.9 11/21/2019   Lab Results  Component Value Date   HGBA1C 5.8 (A) 11/19/2019      Assessment & Plan:   Problem List Items Addressed This Visit      Cardiovascular and Mediastinum   HYPERTENSION, BENIGN - Primary    Well controlled. Continue current regimen. Follow up in  6 mo        Respiratory   Chronic obstructive pulmonary disease (HCC)    Stable. Refilled her albuterol today.        Relevant Medications   albuterol (VENTOLIN HFA) 108 (90 Base) MCG/ACT inhaler     Other   Encounter for chronic pain management    Hydrocodone 7.5/325 1 po TID #90/mo History of lumbar laminectomy with decompression and microdiscectomy December 2017. Additional medications: Piroxicam, Robaxin, Cymbalta Last UDS date:02/18/2020 - monitor for alcohol  Opioid Treatment Agreement signed (Y/N):02/18/2020 Opioid Treatment Agreement last reviewed with patient:  NCCSRS reviewed this encounter (include red flags):Yes      Relevant Medications   HYDROcodone-acetaminophen (NORCO) 7.5-325 MG tablet   Other Relevant Orders   DRUG MONITORING, PANEL 6 WITH CONFIRMATION, URINE    Other Visit Diagnoses    Acute pain of right shoulder  Relevant Orders   DG Shoulder Right   Shortness of breath       Relevant Medications   albuterol (VENTOLIN HFA) 108 (90 Base) MCG/ACT inhaler      Acute right shoulder pain after falling out of bed 3 months ago-discussed getting an x-ray today just to rule out any other underlying fracture etc. She does have normal range of motion so suspect acute bursitis versus rotator cuff injury. Recommend treatment with Tylenol and formal physical therapy. Will call with results once available.  Work on try to  get mammogram scheduled unfortunately in our building we are not able to get her in until February.  Meds ordered this encounter  Medications  . albuterol (VENTOLIN HFA) 108 (90 Base) MCG/ACT inhaler    Sig: TAKE 2 PUFFS BY MOUTH EVERY 6 HOURS AS NEEDED FOR WHEEZE OR SHORTNESS OF BREATH    Dispense:  18 g    Refill:  3  . HYDROcodone-acetaminophen (NORCO) 7.5-325 MG tablet    Sig: Take 1 tablet by mouth every 8 (eight) hours as needed for severe pain.    Dispense:  90 tablet    Refill:  0    Follow-up: Return in about 3 months (around 05/18/2020) for pain management.    Beatrice Lecher, MD

## 2020-02-18 NOTE — Assessment & Plan Note (Signed)
Well controlled. Continue current regimen. Follow up in  6 mo  

## 2020-02-18 NOTE — Assessment & Plan Note (Signed)
Stable. Refilled her albuterol today.

## 2020-02-20 LAB — DRUG MONITORING, PANEL 6 WITH CONFIRMATION, URINE
6 Acetylmorphine: NEGATIVE ng/mL (ref <10)
Alcohol Metabolites: NEGATIVE ng/mL
Amphetamines: NEGATIVE ng/mL (ref <500)
Barbiturates: NEGATIVE ng/mL (ref <300)
Benzodiazepines: NEGATIVE ng/mL (ref <100)
Cocaine Metabolite: NEGATIVE ng/mL (ref <150)
Codeine: NEGATIVE ng/mL (ref <50)
Creatinine: 20.9 mg/dL
Hydrocodone: 581 ng/mL — ABNORMAL HIGH (ref <50)
Hydromorphone: 136 ng/mL — ABNORMAL HIGH (ref <50)
Marijuana Metabolite: NEGATIVE ng/mL (ref <20)
Methadone Metabolite: NEGATIVE ng/mL (ref <100)
Morphine: NEGATIVE ng/mL (ref <50)
Norhydrocodone: 1046 ng/mL — ABNORMAL HIGH (ref <50)
Opiates: POSITIVE ng/mL — AB (ref <100)
Oxidant: NEGATIVE ug/mL
Oxycodone: NEGATIVE ng/mL (ref <100)
Phencyclidine: NEGATIVE ng/mL (ref <25)
pH: 6.9 (ref 4.5–9.0)

## 2020-02-20 LAB — DM TEMPLATE

## 2020-02-28 ENCOUNTER — Ambulatory Visit (INDEPENDENT_AMBULATORY_CARE_PROVIDER_SITE_OTHER): Payer: Medicare Other | Admitting: Sports Medicine

## 2020-02-28 ENCOUNTER — Ambulatory Visit (INDEPENDENT_AMBULATORY_CARE_PROVIDER_SITE_OTHER): Payer: Medicare Other

## 2020-02-28 ENCOUNTER — Other Ambulatory Visit: Payer: Self-pay

## 2020-02-28 DIAGNOSIS — M19011 Primary osteoarthritis, right shoulder: Secondary | ICD-10-CM

## 2020-02-28 NOTE — Progress Notes (Signed)
    Procedures performed today:    Procedure: Real-time Ultrasound Guided injection of the right glenohumeral joint Device: Samsung HS60  Verbal informed consent obtained.  Time-out conducted.  Noted no overlying erythema, induration, or other signs of local infection.  Skin prepped in a sterile fashion.  Local anesthesia: Topical Ethyl chloride.  With sterile technique and under real time ultrasound guidance: 1 cc Kenalog 40, 2 cc lidocaine, 2 cc bupivacaine injected easily Completed without difficulty  Advised to call if fevers/chills, erythema, induration, drainage, or persistent bleeding.  Images permanently stored and available for review in PACS.  Impression: Technically successful ultrasound guided injection.  Independent interpretation of notes and tests performed by another provider:   None.  Brief History, Exam, Impression, and Recommendations:    Primary osteoarthritis of right shoulder This is a very pleasant 69 year old female, a year and a half ago she had a fall resulting in significant bruising and loss of function. Since then she has had increasing pain in her shoulder, localized over the deltoid with most motions, with significant loss of external rotation. X-rays showed severe bone-on-bone glenohumeral osteoarthritis as well as evidence of an old healed midshaft clavicular fracture, the midshaft healed clavicular fracture was asymptomatic. On Xarelto so we are holding off on NSAIDs, glenohumeral injection today, home rehab exercises given, declines formal PT. Return to see me in 6 weeks.    ___________________________________________ Gwen Her. Dianah Field, M.D., ABFM., CAQSM. Primary Care and [place] Instructor of [employer]

## 2020-02-28 NOTE — Assessment & Plan Note (Addendum)
This is a very pleasant 69 year old female, a year and a half ago she had a fall resulting in significant bruising and loss of function. Since then she has had increasing pain in her shoulder, localized over the deltoid with most motions, with significant loss of external rotation. X-rays showed severe bone-on-bone glenohumeral osteoarthritis as well as evidence of an old healed midshaft clavicular fracture, the midshaft healed clavicular fracture was asymptomatic. On Xarelto so we are holding off on NSAIDs, glenohumeral injection today, home rehab exercises given, declines formal PT. Return to see me in 6 weeks.

## 2020-03-20 ENCOUNTER — Other Ambulatory Visit: Payer: Self-pay

## 2020-03-20 DIAGNOSIS — G8929 Other chronic pain: Secondary | ICD-10-CM

## 2020-03-20 MED ORDER — HYDROCODONE-ACETAMINOPHEN 7.5-325 MG PO TABS: 1.0000 | ORAL_TABLET | Freq: Three times a day (TID) | ORAL | 0 refills | Status: DC | PRN

## 2020-04-14 HISTORY — PX: CATARACT EXTRACTION, BILATERAL: SHX1313

## 2020-04-15 ENCOUNTER — Ambulatory Visit (INDEPENDENT_AMBULATORY_CARE_PROVIDER_SITE_OTHER): Payer: Medicare Other

## 2020-04-15 ENCOUNTER — Telehealth: Payer: Medicare Other | Admitting: Sports Medicine

## 2020-04-15 ENCOUNTER — Other Ambulatory Visit: Payer: Self-pay

## 2020-04-15 DIAGNOSIS — Z1231 Encounter for screening mammogram for malignant neoplasm of breast: Secondary | ICD-10-CM

## 2020-04-18 ENCOUNTER — Other Ambulatory Visit: Payer: Self-pay | Admitting: Family Medicine

## 2020-04-18 DIAGNOSIS — G8929 Other chronic pain: Secondary | ICD-10-CM

## 2020-04-20 ENCOUNTER — Other Ambulatory Visit: Payer: Self-pay

## 2020-04-20 DIAGNOSIS — G8929 Other chronic pain: Secondary | ICD-10-CM

## 2020-04-20 NOTE — Telephone Encounter (Signed)
Patient called for a refill on her hydrocodone.  She requested a call when this was sent in.  Prescription pended for your approval.

## 2020-04-21 ENCOUNTER — Other Ambulatory Visit: Payer: Self-pay

## 2020-04-21 DIAGNOSIS — G8929 Other chronic pain: Secondary | ICD-10-CM

## 2020-04-22 MED ORDER — HYDROCODONE-ACETAMINOPHEN 7.5-325 MG PO TABS: 1.0000 | ORAL_TABLET | Freq: Three times a day (TID) | ORAL | 0 refills | Status: DC | PRN

## 2020-05-18 ENCOUNTER — Other Ambulatory Visit: Payer: Self-pay

## 2020-05-18 ENCOUNTER — Encounter: Payer: Self-pay | Admitting: Family Medicine

## 2020-05-18 ENCOUNTER — Ambulatory Visit (INDEPENDENT_AMBULATORY_CARE_PROVIDER_SITE_OTHER): Payer: Medicare Other | Admitting: Family Medicine

## 2020-05-18 VITALS — BP 130/72 | HR 74 | Ht 63.0 in | Wt 245.0 lb

## 2020-05-18 DIAGNOSIS — R0602 Shortness of breath: Secondary | ICD-10-CM

## 2020-05-18 DIAGNOSIS — Z636 Dependent relative needing care at home: Secondary | ICD-10-CM | POA: Diagnosis not present

## 2020-05-18 DIAGNOSIS — R7301 Impaired fasting glucose: Secondary | ICD-10-CM | POA: Diagnosis not present

## 2020-05-18 DIAGNOSIS — G8929 Other chronic pain: Secondary | ICD-10-CM | POA: Diagnosis not present

## 2020-05-18 DIAGNOSIS — I10 Essential (primary) hypertension: Secondary | ICD-10-CM

## 2020-05-18 DIAGNOSIS — Z780 Asymptomatic menopausal state: Secondary | ICD-10-CM | POA: Diagnosis not present

## 2020-05-18 DIAGNOSIS — J449 Chronic obstructive pulmonary disease, unspecified: Secondary | ICD-10-CM | POA: Diagnosis not present

## 2020-05-18 MED ORDER — HYDROCODONE-ACETAMINOPHEN 10-325 MG PO TABS: 1.0000 | ORAL_TABLET | Freq: Three times a day (TID) | ORAL | 0 refills | Status: DC | PRN

## 2020-05-18 MED ORDER — ALBUTEROL SULFATE HFA 108 (90 BASE) MCG/ACT IN AERS: INHALATION_SPRAY | RESPIRATORY_TRACT | 3 refills | Status: DC

## 2020-05-18 NOTE — Assessment & Plan Note (Signed)
Traveling back and forth to [place] to visit her mother and go through her mother's home.

## 2020-05-18 NOTE — Assessment & Plan Note (Signed)
Well controlled. Continue current regimen. Follow up in  3 mo .  

## 2020-05-18 NOTE — Assessment & Plan Note (Signed)
   Lab Results  Component Value Date   HGBA1C 5.8 (A) 11/19/2019

## 2020-05-18 NOTE — Progress Notes (Signed)
Established Patient Office Visit  Subjective:  Patient ID: Kelly Roth, female    DOB: 1950-03-20  Age: 70 y.o. MRN: [id number]  CC:  Chief Complaint  Patient presents with  . Pain Management    HPI Kelly Roth presents for pain management.  Is here for 6-month follow-up.  She has been on her current pain regimen for quite some time she wants to know if today she could actually go up to 10 mg she just feels like the medication is not as effective as it used to be in providing pain relief.  She has been having a lot of problems with her knees she said as soon as she wakes up she is in significant pain with her knees and then then it starts to affect her hips and then it moves into her back.  She actually has had knee replacement on the left side and that is the side that seems to be most bothersome she says it has been over 15 years since her knee replacement she is at the point where she is planning on getting back in to see the surgeon.  She is just been trying to hold off with helping to take care of her mother so that does not burden her sister who lives closer to her mother who is doing a lot of the caretaking.  Hypertension- Pt denies chest pain, SOB, dizziness, or heart palpitations.  Taking meds as directed w/o problems.  Denies medication side effects.    COPD-she has chronic cough she continues to smoke.  She denies any recent exacerbations with her COPD though she did use her albuterol this morning.Uses her Anoro daily.    Past Medical History:  Diagnosis Date  . Adenomatous polyps 2004    Colonoscopy done in [facility]  . Arthritis   . Back pain   . Complication of anesthesia   . COPD (chronic obstructive pulmonary disease) ([place])   . Dyspnea    pain contributes but also pt. reveals that she has been out of her Spiriva for one month +  . GERD (gastroesophageal reflux disease)   . Hemorrhoids   . Hyperlipidemia   . Hypertension   . Osteoarthritis   .  PONV (postoperative nausea and vomiting)    scopolamine patch used with success with a prev. surg.     Past Surgical History:  Procedure Laterality Date  . CHOLECYSTECTOMY  05-08-07  . JOINT REPLACEMENT Bilateral   . left knee replacement  3-07   right on 2-06  . LUMBAR LAMINECTOMY/DECOMPRESSION MICRODISCECTOMY N/A 03/01/2016   Procedure: LEFT AND CENTRAL L3-4 AND LEFT L4-5 DECOMPRESSIVE LUMBAR LAMINECTOMY FOR SPINAL STENOSIS;  Surgeon: Jessy Oto, MD;  Location: [facility];  Service: Orthopedics;  Laterality: N/A;  . MULTIPLE TOOTH EXTRACTIONS      Family History  Problem Relation Age of Onset  . Depression Mother   . Hypertension Mother   . Hyperlipidemia Mother   . COPD Mother   . Lung cancer Mother   . Hyperlipidemia Father   . Hypertension Father   . Breast cancer Maternal Grandmother   . Colon cancer Neg Hx        Neg     Social History   Socioeconomic History  . Marital status: Married    Spouse name: Not on file  . Number of children: Not on file  . Years of education: Not on file  . Highest education level: Not on file  Occupational History  .  Occupation: Caregiver  Tobacco Use  . Smoking status: Current Every Day Smoker    Packs/day: 0.50    Types: Cigarettes  . Smokeless tobacco: Never Used  . Tobacco comment: very rare use, she remarks "I had one cigarette today, reports thia is the first one in a month"  Vaping Use  . Vaping Use: Never used  Substance and Sexual Activity  . Alcohol use: Yes    Alcohol/week: 0.0 standard drinks    Comment: one drink a week   . Drug use: No  . Sexual activity: Not on file  Other Topics Concern  . 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   Social Determinants of Health   Financial Resource Strain: Not on file  Food Insecurity: Not on file  Transportation Needs: Not on file  Physical Activity: Not on file  Stress: Not on file  Social Connections: Not on file   Intimate Partner Violence: Not on file    Outpatient Medications Prior to Visit  Medication Sig Dispense Refill  . DULoxetine (CYMBALTA) 60 MG capsule TAKE 1 CAPSULE BY MOUTH EVERY DAY 90 capsule 1  . furosemide (LASIX) 40 MG tablet TAKE 1 TABLET BY MOUTH TWICE A DAY 180 tablet 1  . lisinopril-hydrochlorothiazide (ZESTORETIC) 20-12.5 MG tablet TAKE 1 TABLET BY MOUTH EVERY DAY 90 tablet 1  . metoprolol succinate (TOPROL-XL) 50 MG 24 hr tablet TAKE 1 TABLET (50 MG TOTAL) BY MOUTH DAILY. TAKE WITH OR IMMEDIATELY FOLLOWING A MEAL. 90 tablet 1  . Multiple Vitamin (MULTIVITAMIN) capsule Take 1 capsule by mouth daily.      . rivaroxaban (XARELTO) 20 MG TABS tablet Take 20 mg by mouth daily.    . rosuvastatin (CRESTOR) 40 MG tablet TAKE 1 TABLET BY MOUTH EVERY DAY 90 tablet 3  . umeclidinium-vilanterol (ANORO ELLIPTA) 62.5-25 MCG/INH AEPB INHALE 1 PUFF BY MOUTH EVERY DAY 60 each 6  . albuterol (VENTOLIN HFA) 108 (90 Base) MCG/ACT inhaler TAKE 2 PUFFS BY MOUTH EVERY 6 HOURS AS NEEDED FOR WHEEZE OR SHORTNESS OF BREATH 18 g 3  . HYDROcodone-acetaminophen (NORCO) 7.5-325 MG tablet Take 1 tablet by mouth every 8 (eight) hours as needed for severe pain. 90 tablet 0   No facility-administered medications prior to visit.    Allergies  Allergen Reactions  . Nsaids Other (See Comments)    ROS Review of Systems    Objective:    Physical Exam Constitutional:      Appearance: She is well-developed and well-nourished.  HENT:     Head: Normocephalic and atraumatic.  Cardiovascular:     Rate and Rhythm: Normal rate and regular rhythm.     Heart sounds: Normal heart sounds.  Pulmonary:     Effort: Pulmonary effort is normal.     Breath sounds: Normal breath sounds.     Comments: diffuse course BS Skin:    General: Skin is warm and dry.  Neurological:     Mental Status: She is alert and oriented to person, place, and time.  Psychiatric:        Mood and Affect: Mood and affect normal.         Behavior: Behavior normal.     BP 130/72   Pulse 74   Ht 5\' 3"  (1.6 m)   Wt 245 lb (111.1 kg)   SpO2 94%   BMI 43.40 kg/m  Wt Readings from Last 3 Encounters:  05/18/20 245 lb (111.1 kg)  02/18/20 234  lb (106.1 kg)  11/19/19 231 lb (104.8 kg)     Health Maintenance Due  Topic Date Due  . DEXA SCAN  Never done    There are no preventive care reminders to display for this patient.  Lab Results  Component Value Date   TSH 1.75 08/22/2018   Lab Results  Component Value Date   WBC 7.4 11/21/2019   HGB 15.4 11/21/2019   HCT 45.0 11/21/2019   MCV 94.7 11/21/2019   PLT 142 11/21/2019   Lab Results  Component Value Date   NA 138 11/21/2019   K 3.7 11/21/2019   CO2 30 11/21/2019   GLUCOSE 100 (H) 11/21/2019   BUN 21 11/21/2019   CREATININE 0.91 11/21/2019   BILITOT 0.6 11/21/2019   ALKPHOS 95 08/22/2016   AST 18 11/21/2019   ALT 57 (H) 11/21/2019   PROT 6.4 11/21/2019   ALBUMIN 4.3 08/22/2016   CALCIUM 10.0 11/21/2019   ANIONGAP 7 03/02/2016   Lab Results  Component Value Date   CHOL 137 11/21/2019   Lab Results  Component Value Date   HDL 47 (L) 11/21/2019   Lab Results  Component Value Date   LDLCALC 72 11/21/2019   Lab Results  Component Value Date   TRIG 99 11/21/2019   Lab Results  Component Value Date   CHOLHDL 2.9 11/21/2019   Lab Results  Component Value Date   HGBA1C 5.8 (A) 11/19/2019      Assessment & Plan:   Problem List Items Addressed This Visit      Cardiovascular and Mediastinum   HYPERTENSION, BENIGN    Well controlled. Continue current regimen. Follow up in  3 mo        Respiratory   Chronic obstructive pulmonary disease (HCC)    No recent flares though her baseline is fair.  Uses her Anoro daily.  Last time she used her albuterol was today.she is asking for refills but she should still have a refill.        Relevant Medications   albuterol (VENTOLIN HFA) 108 (90 Base) MCG/ACT inhaler     Endocrine   IFG  (impaired fasting glucose)      Lab Results  Component Value Date   HGBA1C 5.8 (A) 11/19/2019           Other   Encounter for chronic pain management - Primary    Hydrocodone 7.5/325 1 po TID #90/mo History of lumbar laminectomy with decompression and microdiscectomy December 2017. Additional medications: Piroxicam, Robaxin, Cymbalta Last UDS date:02/18/2020 - monitor for alcohol  Opioid Treatment Agreement signed (Y/N):02/18/2020 Opioid Treatment Agreement last reviewed with patient:  NCCSRS reviewed this encounter (include red flags):Yes       Relevant Orders   DRUG MONITORING, PANEL 6 WITH CONFIRMATION, URINE   Caregiver burden    Traveling back and forth to [place] to visit her mother and go through her mother's home.       Other Visit Diagnoses    Post-menopausal       Relevant Orders   DG Bone Density   Shortness of breath       Relevant Medications   albuterol (VENTOLIN HFA) 108 (90 Base) MCG/ACT inhaler      Knee pain-did encourage her to go ahead and schedule with her orthopedist and if she needs referral let me know it may result in needing surgery but maybe she can work that out with her sister in regards to taking care of her mom.  But I  do not want her to not necessarily take care of herself so just really encourage her to at least get the consultation going.  DEXA Scan ordered today.  Meds ordered this encounter  Medications  . HYDROcodone-acetaminophen (NORCO) 10-325 MG tablet    Sig: Take 1 tablet by mouth every 8 (eight) hours as needed.    Dispense:  90 tablet    Refill:  0  . HYDROcodone-acetaminophen (NORCO) 10-325 MG tablet    Sig: Take 1 tablet by mouth every 8 (eight) hours as needed.    Dispense:  90 tablet    Refill:  0  . HYDROcodone-acetaminophen (NORCO) 10-325 MG tablet    Sig: Take 1 tablet by mouth every 8 (eight) hours as needed.    Dispense:  90 tablet    Refill:  0  . albuterol (VENTOLIN HFA) 108 (90 Base) MCG/ACT  inhaler    Sig: TAKE 2 PUFFS BY MOUTH EVERY 6 HOURS AS NEEDED FOR WHEEZE OR SHORTNESS OF BREATH    Dispense:  18 g    Refill:  3    Follow-up: Return in about 3 months (around 08/18/2020) for change Pain regimen.  Beatrice Lecher, MD

## 2020-05-18 NOTE — Assessment & Plan Note (Signed)
Hydrocodone 7.5/325 1 po TID #90/mo History of lumbar laminectomy with decompression and microdiscectomy December 2017. Additional medications: Piroxicam, Robaxin, Cymbalta Last UDS date:02/18/2020 - monitor for alcohol Opioid Treatment Agreement signed (Y/N):02/18/2020 Opioid Treatment Agreement last reviewed with patient:  NCCSRS reviewed this encounter (include red flags):Yes 

## 2020-05-18 NOTE — Assessment & Plan Note (Addendum)
No recent flares though her baseline is fair.  Uses her Anoro daily.  Last time she used her albuterol was today.she is asking for refills but she should still have a refill.

## 2020-05-20 LAB — DRUG MONITORING, PANEL 6 WITH CONFIRMATION, URINE
6 Acetylmorphine: NEGATIVE ng/mL (ref <10)
Alcohol Metabolites: NEGATIVE ng/mL
Amphetamines: NEGATIVE ng/mL (ref <500)
Barbiturates: NEGATIVE ng/mL (ref <300)
Benzodiazepines: NEGATIVE ng/mL (ref <100)
Cocaine Metabolite: NEGATIVE ng/mL (ref <150)
Codeine: NEGATIVE ng/mL (ref <50)
Creatinine: 26.2 mg/dL
Ethyl Glucuronide (ETG): NEGATIVE ng/mL (ref <500)
Ethyl Sulfate (ETS): NEGATIVE ng/mL (ref <100)
Hydrocodone: 1006 ng/mL — ABNORMAL HIGH (ref <50)
Hydromorphone: 201 ng/mL — ABNORMAL HIGH (ref <50)
Marijuana Metabolite: NEGATIVE ng/mL (ref <20)
Methadone Metabolite: NEGATIVE ng/mL (ref <100)
Morphine: NEGATIVE ng/mL (ref <50)
Norhydrocodone: 2107 ng/mL — ABNORMAL HIGH (ref <50)
Opiates: POSITIVE ng/mL — AB (ref <100)
Oxidant: NEGATIVE ug/mL
Oxycodone: NEGATIVE ng/mL (ref <100)
Phencyclidine: NEGATIVE ng/mL (ref <25)
pH: 6.8 (ref 4.5–9.0)

## 2020-05-20 LAB — DM TEMPLATE

## 2020-06-15 ENCOUNTER — Telehealth: Payer: Self-pay | Admitting: *Deleted

## 2020-06-15 NOTE — Telephone Encounter (Signed)
Medication due on 06/17/2020. Can pick it up at that time.   Pt's husband advised.

## 2020-07-01 ENCOUNTER — Other Ambulatory Visit: Payer: Self-pay | Admitting: Family Medicine

## 2020-07-16 ENCOUNTER — Other Ambulatory Visit: Payer: Self-pay

## 2020-07-16 ENCOUNTER — Ambulatory Visit (INDEPENDENT_AMBULATORY_CARE_PROVIDER_SITE_OTHER): Payer: Medicare Other | Admitting: Family Medicine

## 2020-07-16 ENCOUNTER — Ambulatory Visit (INDEPENDENT_AMBULATORY_CARE_PROVIDER_SITE_OTHER): Payer: Medicare Other

## 2020-07-16 ENCOUNTER — Encounter: Payer: Self-pay | Admitting: Family Medicine

## 2020-07-16 VITALS — BP 109/58 | HR 122 | Ht 63.0 in | Wt 256.0 lb

## 2020-07-16 DIAGNOSIS — J441 Chronic obstructive pulmonary disease with (acute) exacerbation: Secondary | ICD-10-CM

## 2020-07-16 DIAGNOSIS — R059 Cough, unspecified: Secondary | ICD-10-CM | POA: Diagnosis not present

## 2020-07-16 DIAGNOSIS — J449 Chronic obstructive pulmonary disease, unspecified: Secondary | ICD-10-CM | POA: Diagnosis not present

## 2020-07-16 DIAGNOSIS — R0602 Shortness of breath: Secondary | ICD-10-CM | POA: Diagnosis not present

## 2020-07-16 DIAGNOSIS — I10 Essential (primary) hypertension: Secondary | ICD-10-CM | POA: Diagnosis not present

## 2020-07-16 DIAGNOSIS — I509 Heart failure, unspecified: Secondary | ICD-10-CM

## 2020-07-16 DIAGNOSIS — J9811 Atelectasis: Secondary | ICD-10-CM | POA: Diagnosis not present

## 2020-07-16 DIAGNOSIS — J90 Pleural effusion, not elsewhere classified: Secondary | ICD-10-CM | POA: Diagnosis not present

## 2020-07-16 DIAGNOSIS — R0600 Dyspnea, unspecified: Secondary | ICD-10-CM | POA: Diagnosis not present

## 2020-07-16 DIAGNOSIS — I517 Cardiomegaly: Secondary | ICD-10-CM | POA: Diagnosis not present

## 2020-07-16 DIAGNOSIS — J811 Chronic pulmonary edema: Secondary | ICD-10-CM | POA: Diagnosis not present

## 2020-07-16 DIAGNOSIS — E8770 Fluid overload, unspecified: Secondary | ICD-10-CM | POA: Diagnosis not present

## 2020-07-16 MED ORDER — PREDNISONE 20 MG PO TABS: 40.0000 mg | ORAL_TABLET | Freq: Every day | ORAL | 0 refills | Status: DC

## 2020-07-16 MED ORDER — DOXYCYCLINE HYCLATE 100 MG PO TABS: 100.0000 mg | ORAL_TABLET | Freq: Two times a day (BID) | ORAL | 0 refills | Status: DC

## 2020-07-16 NOTE — Progress Notes (Signed)
Acute Office Visit  Subjective:    Patient ID: BEUNA BOLDING, female    DOB: 1950-03-15, 70 y.o.   MRN: [id number]  Chief Complaint  Patient presents with  . COPD    HPI Patient is in today for shortness of breath, increased cough and sputum production x1 month.  It started while she was in [place].  No fever, chills sweats or GI symptoms.  Pt reports that she has noticed that her O2% has been dropping more since she has got back home from [place]. She has a watch that she wears at times and has noticed especially at night that her oxygen has really been dropping. She is unable to sleep lying down due to not being able to breath well and has to sleep in her lounger. She has been doing this for months.  She is also noticed some increase in lower extremity swelling she always has a little bit but feels like it has been worse lately.  She said that she can hardly go short distances without getting extremely SOB and needing to stop to get her breath.   Pt denies any CP or tightness.Has been using her ProAir a couple of times a day.  She feels she is using it too much.    Past Medical History:  Diagnosis Date  . Adenomatous polyps 2004    Colonoscopy done in [facility]  . Arthritis   . Back pain   . Complication of anesthesia   . COPD (chronic obstructive pulmonary disease) ([place])   . Dyspnea    pain contributes but also pt. reveals that she has been out of her Spiriva for one month +  . GERD (gastroesophageal reflux disease)   . Hemorrhoids   . Hyperlipidemia   . Hypertension   . Osteoarthritis   . PONV (postoperative nausea and vomiting)    scopolamine patch used with success with a prev. surg.     Past Surgical History:  Procedure Laterality Date  . CHOLECYSTECTOMY  05-08-07  . JOINT REPLACEMENT Bilateral   . left knee replacement  3-07   right on 2-06  . LUMBAR LAMINECTOMY/DECOMPRESSION MICRODISCECTOMY N/A 03/01/2016   Procedure: LEFT AND CENTRAL L3-4  AND LEFT L4-5 DECOMPRESSIVE LUMBAR LAMINECTOMY FOR SPINAL STENOSIS;  Surgeon: Jessy Oto, MD;  Location: [place];  Service: Orthopedics;  Laterality: N/A;  . MULTIPLE TOOTH EXTRACTIONS      Family History  Problem Relation Age of Onset  . Depression Mother   . Hypertension Mother   . Hyperlipidemia Mother   . COPD Mother   . Lung cancer Mother   . Hyperlipidemia Father   . Hypertension Father   . Breast cancer Maternal Grandmother   . Colon cancer Neg Hx        Neg     Social History   Socioeconomic History  . Marital status: Married    Spouse name: Not on file  . Number of children: Not on file  . Years of education: Not on file  . Highest education level: Not on file  Occupational History  . Occupation: Caregiver  Tobacco Use  . Smoking status: Current Every Day Smoker    Packs/day: 0.50    Types: Cigarettes  . Smokeless tobacco: Never Used  . Tobacco comment: very rare use, she remarks "I had one cigarette today, reports thia is the first one in a month"  Vaping Use  . Vaping Use: Never used  Substance and Sexual Activity  . Alcohol  use: Yes    Alcohol/week: 0.0 standard drinks    Comment: one drink a week   . Drug use: No  . Sexual activity: Not on file  Other Topics Concern  . 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   Social Determinants of Health   Financial Resource Strain: Not on file  Food Insecurity: Not on file  Transportation Needs: Not on file  Physical Activity: Not on file  Stress: Not on file  Social Connections: Not on file  Intimate Partner Violence: Not on file    Outpatient Medications Prior to Visit  Medication Sig Dispense Refill  . albuterol (VENTOLIN HFA) 108 (90 Base) MCG/ACT inhaler TAKE 2 PUFFS BY MOUTH EVERY 6 HOURS AS NEEDED FOR WHEEZE OR SHORTNESS OF BREATH 18 g 3  . DULoxetine (CYMBALTA) 60 MG capsule TAKE 1 CAPSULE BY MOUTH EVERY DAY 90 capsule 1  . furosemide (LASIX) 40 MG  tablet TAKE 1 TABLET BY MOUTH TWICE A DAY 180 tablet 1  . HYDROcodone-acetaminophen (NORCO) 10-325 MG tablet Take 1 tablet by mouth every 8 (eight) hours as needed. 90 tablet 0  . HYDROcodone-acetaminophen (NORCO) 10-325 MG tablet Take 1 tablet by mouth every 8 (eight) hours as needed. 90 tablet 0  . HYDROcodone-acetaminophen (NORCO) 10-325 MG tablet Take 1 tablet by mouth every 8 (eight) hours as needed. 90 tablet 0  . lisinopril-hydrochlorothiazide (ZESTORETIC) 20-12.5 MG tablet TAKE 1 TABLET BY MOUTH EVERY DAY 90 tablet 1  . metoprolol succinate (TOPROL-XL) 50 MG 24 hr tablet TAKE 1 TABLET (50 MG TOTAL) BY MOUTH DAILY. TAKE WITH OR IMMEDIATELY FOLLOWING A MEAL. 90 tablet 1  . Multiple Vitamin (MULTIVITAMIN) capsule Take 1 capsule by mouth daily.      . rivaroxaban (XARELTO) 20 MG TABS tablet Take 20 mg by mouth daily.    . rosuvastatin (CRESTOR) 40 MG tablet TAKE 1 TABLET BY MOUTH EVERY DAY 90 tablet 3  . umeclidinium-vilanterol (ANORO ELLIPTA) 62.5-25 MCG/INH AEPB INHALE 1 PUFF BY MOUTH EVERY DAY 60 each 6   No facility-administered medications prior to visit.    Allergies  Allergen Reactions  . Nsaids Other (See Comments)    Review of Systems     Objective:    Physical Exam Constitutional:      Appearance: She is well-developed.  HENT:     Head: Normocephalic and atraumatic.  Cardiovascular:     Rate and Rhythm: Normal rate and regular rhythm.     Heart sounds: Normal heart sounds.  Pulmonary:     Effort: Pulmonary effort is normal.     Breath sounds: Normal breath sounds.  Skin:    General: Skin is warm and dry.  Neurological:     Mental Status: She is alert and oriented to person, place, and time.  Psychiatric:        Behavior: Behavior normal.     BP (!) 109/58   Pulse (!) 122   Ht 5\' 3"  (1.6 m)   Wt 256 lb (116.1 kg)   SpO2 (!) 87%   BMI 45.35 kg/m  Wt Readings from Last 3 Encounters:  07/16/20 256 lb (116.1 kg)  05/18/20 245 lb (111.1 kg)  02/18/20 234  lb (106.1 kg)    Health Maintenance Due  Topic Date Due  . DEXA SCAN  Never done    There are no preventive care reminders to display for this patient.   Lab Results  Component Value Date  TSH 1.75 08/22/2018   Lab Results  Component Value Date   WBC 7.4 11/21/2019   HGB 15.4 11/21/2019   HCT 45.0 11/21/2019   MCV 94.7 11/21/2019   PLT 142 11/21/2019   Lab Results  Component Value Date   NA 138 11/21/2019   K 3.7 11/21/2019   CO2 30 11/21/2019   GLUCOSE 100 (H) 11/21/2019   BUN 21 11/21/2019   CREATININE 0.91 11/21/2019   BILITOT 0.6 11/21/2019   ALKPHOS 95 08/22/2016   AST 18 11/21/2019   ALT 57 (H) 11/21/2019   PROT 6.4 11/21/2019   ALBUMIN 4.3 08/22/2016   CALCIUM 10.0 11/21/2019   ANIONGAP 7 03/02/2016   Lab Results  Component Value Date   CHOL 137 11/21/2019   Lab Results  Component Value Date   HDL 47 (L) 11/21/2019   Lab Results  Component Value Date   LDLCALC 72 11/21/2019   Lab Results  Component Value Date   TRIG 99 11/21/2019   Lab Results  Component Value Date   CHOLHDL 2.9 11/21/2019   Lab Results  Component Value Date   HGBA1C 5.8 (A) 11/19/2019       Assessment & Plan:   Problem List Items Addressed This Visit      Cardiovascular and Mediastinum   HYPERTENSION, BENIGN    Pressure is actually borderline low today which is a little concerning considering that I do feel like she is volume overloaded.      Relevant Orders   B Nat Peptide    Other Visit Diagnoses    COPD exacerbation ([place])    -  Primary   Relevant Medications   predniSONE (DELTASONE) 20 MG tablet   doxycycline (VIBRA-TABS) 100 MG tablet   Other Relevant Orders   DG Chest 2 View   BASIC METABOLIC PANEL WITH GFR   CBC with Differential/Platelet   B Nat Peptide   SOB (shortness of breath)       Relevant Orders   BASIC METABOLIC PANEL WITH GFR   CBC with Differential/Platelet   B Nat Peptide   Hypervolemia, unspecified hypervolemia type        Relevant Orders   BASIC METABOLIC PANEL WITH GFR   CBC with Differential/Platelet   B Nat Peptide     COPD exacerbation - tx with doxycyline, prednisone and return for f/u in 2 weeks.  We will get chest x-ray for further work-up as well the symptoms have been persistent for a month.  She also appears to be volume overloaded on exam she has 1+ pitting edema as well as some weeping of lower extremities some to have her double up on her furosemide for a couple of days and try to get some of the extra fluid off in the meantime make sure eating a very low salt diet.  Call with chest x-ray results once available.  I really also like to get some updated blood work but unfortunately our lab is closed for today.  Weight is up 9 pounds since March.  I feel like some of this is certainly fluid overload.  Also consider further work-up for possible heart failure.   Meds ordered this encounter  Medications  . predniSONE (DELTASONE) 20 MG tablet    Sig: Take 2 tablets (40 mg total) by mouth daily with breakfast.    Dispense:  10 tablet    Refill:  0  . doxycycline (VIBRA-TABS) 100 MG tablet    Sig: Take 1 tablet (100 mg total) by mouth  2 (two) times daily.    Dispense:  20 tablet    Refill:  0     Beatrice Lecher, MD

## 2020-07-16 NOTE — Patient Instructions (Signed)
Increase furosemide to 2 tabs daily for 2 days. Then go back down to one a day.

## 2020-07-16 NOTE — Progress Notes (Signed)
Pt reports that she has noticed that her O2% has been dropping more since she has got back home from [place].  She has a watch that she wears at times and has noticed especially at night that her oxygen has really been dropping.  She is unable to sleep lying down due to not being able to breath well and has to sleep in her lounger. She has been doing this for months.  She said that she can hardly go short distances without getting extremely SOB and needing to stop to get her breath.   Pt denies any CP or tightness.

## 2020-07-16 NOTE — Assessment & Plan Note (Signed)
Pressure is actually borderline low today which is a little concerning considering that I do feel like she is volume overloaded.

## 2020-07-20 DIAGNOSIS — E8770 Fluid overload, unspecified: Secondary | ICD-10-CM | POA: Diagnosis not present

## 2020-07-20 DIAGNOSIS — I10 Essential (primary) hypertension: Secondary | ICD-10-CM | POA: Diagnosis not present

## 2020-07-20 DIAGNOSIS — R0602 Shortness of breath: Secondary | ICD-10-CM | POA: Diagnosis not present

## 2020-07-20 DIAGNOSIS — J441 Chronic obstructive pulmonary disease with (acute) exacerbation: Secondary | ICD-10-CM | POA: Diagnosis not present

## 2020-07-21 LAB — CBC WITH DIFFERENTIAL/PLATELET
Absolute Monocytes: 91 cells/uL — ABNORMAL LOW (ref 200–950)
Basophils Absolute: 18 cells/uL (ref 0–200)
Basophils Relative: 0.2 %
Eosinophils Absolute: 0 cells/uL — ABNORMAL LOW (ref 15–500)
Eosinophils Relative: 0 %
HCT: 42.8 % (ref 35.0–45.0)
Hemoglobin: 13.1 g/dL (ref 11.7–15.5)
Lymphs Abs: 464 cells/uL — ABNORMAL LOW (ref 850–3900)
MCH: 29 pg (ref 27.0–33.0)
MCHC: 30.6 g/dL — ABNORMAL LOW (ref 32.0–36.0)
MCV: 94.9 fL (ref 80.0–100.0)
MPV: 11.5 fL (ref 7.5–12.5)
Monocytes Relative: 1 %
Neutro Abs: 8527 cells/uL — ABNORMAL HIGH (ref 1500–7800)
Neutrophils Relative %: 93.7 %
Platelets: 127 10*3/uL — ABNORMAL LOW (ref 140–400)
RBC: 4.51 10*6/uL (ref 3.80–5.10)
RDW: 13.7 % (ref 11.0–15.0)
Total Lymphocyte: 5.1 %
WBC: 9.1 10*3/uL (ref 3.8–10.8)

## 2020-07-21 LAB — BASIC METABOLIC PANEL WITH GFR
BUN: 16 mg/dL (ref 7–25)
CO2: 33 mmol/L — ABNORMAL HIGH (ref 20–32)
Calcium: 10.3 mg/dL (ref 8.6–10.4)
Chloride: 94 mmol/L — ABNORMAL LOW (ref 98–110)
Creat: 0.83 mg/dL (ref 0.50–0.99)
GFR, Est African American: 85 mL/min/{1.73_m2} (ref >=60)
GFR, Est Non African American: 73 mL/min/{1.73_m2} (ref >=60)
Glucose, Bld: 133 mg/dL — ABNORMAL HIGH (ref 65–99)
Potassium: 4.1 mmol/L (ref 3.5–5.3)
Sodium: 137 mmol/L (ref 135–146)

## 2020-07-21 LAB — BRAIN NATRIURETIC PEPTIDE: Brain Natriuretic Peptide: 905 pg/mL — ABNORMAL HIGH (ref <100)

## 2020-07-21 NOTE — Addendum Note (Signed)
Addended by: Beatrice Lecher D on: 07/21/2020 05:31 PM   Modules accepted: Orders

## 2020-07-22 ENCOUNTER — Telehealth: Payer: Self-pay | Admitting: *Deleted

## 2020-07-22 NOTE — Telephone Encounter (Signed)
Error

## 2020-07-23 ENCOUNTER — Other Ambulatory Visit: Payer: Self-pay

## 2020-07-23 ENCOUNTER — Ambulatory Visit (INDEPENDENT_AMBULATORY_CARE_PROVIDER_SITE_OTHER): Payer: Medicare Other | Admitting: Family Medicine

## 2020-07-23 ENCOUNTER — Encounter: Payer: Self-pay | Admitting: Family Medicine

## 2020-07-23 VITALS — BP 130/67 | HR 107 | Ht 63.0 in | Wt 231.0 lb

## 2020-07-23 DIAGNOSIS — I509 Heart failure, unspecified: Secondary | ICD-10-CM | POA: Diagnosis not present

## 2020-07-23 DIAGNOSIS — I10 Essential (primary) hypertension: Secondary | ICD-10-CM

## 2020-07-23 DIAGNOSIS — R0602 Shortness of breath: Secondary | ICD-10-CM | POA: Diagnosis not present

## 2020-07-23 DIAGNOSIS — R0902 Hypoxemia: Secondary | ICD-10-CM | POA: Diagnosis not present

## 2020-07-23 DIAGNOSIS — Z23 Encounter for immunization: Secondary | ICD-10-CM | POA: Diagnosis not present

## 2020-07-23 NOTE — Progress Notes (Signed)
Established Patient Office Visit  Subjective:  Patient ID: Kelly Roth, female    DOB: 02-18-1951  Age: 70 y.o. MRN: [id number]  CC:  Chief Complaint  Patient presents with  . COPD    HPI Kelly Roth presents for new dx of heart failure.  She came in SOB with volume overload about a week ago.  Her BNP was very high. She double her lasix to 2 tabs BID and she is down 24 lbs.  Swelling is better.  She is less SOB but still very weak and SOB with activity.  She is using her walker.  Says her oxygen is dropping to 88% in the evenings.    Also treated her with doxy and prednisone.  She is feeling some better.  \  Her husband is here with her today to help with results.  She said she did have difficulty walking into the building and had to stop halfway just to rest.  Past Medical History:  Diagnosis Date  . Adenomatous polyps 2004    Colonoscopy done in [facility]  . Arthritis   . Back pain   . Complication of anesthesia   . COPD (chronic obstructive pulmonary disease) ([place])   . Dyspnea    pain contributes but also pt. reveals that she has been out of her Spiriva for one month +  . GERD (gastroesophageal reflux disease)   . Hemorrhoids   . Hyperlipidemia   . Hypertension   . Osteoarthritis   . PONV (postoperative nausea and vomiting)    scopolamine patch used with success with a prev. surg.     Past Surgical History:  Procedure Laterality Date  . CHOLECYSTECTOMY  05-08-07  . JOINT REPLACEMENT Bilateral   . left knee replacement  3-07   right on 2-06  . LUMBAR LAMINECTOMY/DECOMPRESSION MICRODISCECTOMY N/A 03/01/2016   Procedure: LEFT AND CENTRAL L3-4 AND LEFT L4-5 DECOMPRESSIVE LUMBAR LAMINECTOMY FOR SPINAL STENOSIS;  Surgeon: Jessy Oto, MD;  Location: [place];  Service: Orthopedics;  Laterality: N/A;  . MULTIPLE TOOTH EXTRACTIONS      Family History  Problem Relation Age of Onset  . Depression Mother   . Hypertension Mother   . Hyperlipidemia  Mother   . COPD Mother   . Lung cancer Mother   . Hyperlipidemia Father   . Hypertension Father   . Breast cancer Maternal Grandmother   . Colon cancer Neg Hx        Neg     Social History   Socioeconomic History  . Marital status: Married    Spouse name: Not on file  . Number of children: Not on file  . Years of education: Not on file  . Highest education level: Not on file  Occupational History  . Occupation: Caregiver  Tobacco Use  . Smoking status: Current Every Day Smoker    Packs/day: 0.50    Types: Cigarettes  . Smokeless tobacco: Never Used  . Tobacco comment: very rare use, she remarks "I had one cigarette today, reports thia is the first one in a month"  Vaping Use  . Vaping Use: Never used  Substance and Sexual Activity  . Alcohol use: Yes    Alcohol/week: 0.0 standard drinks    Comment: one drink a week   . Drug use: No  . Sexual activity: Not on file  Other Topics Concern  . 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  Social Determinants of Health   Financial Resource Strain: Not on file  Food Insecurity: Not on file  Transportation Needs: Not on file  Physical Activity: Not on file  Stress: Not on file  Social Connections: Not on file  Intimate Partner Violence: Not on file    Outpatient Medications Prior to Visit  Medication Sig Dispense Refill  . albuterol (VENTOLIN HFA) 108 (90 Base) MCG/ACT inhaler TAKE 2 PUFFS BY MOUTH EVERY 6 HOURS AS NEEDED FOR WHEEZE OR SHORTNESS OF BREATH 18 g 3  . DULoxetine (CYMBALTA) 60 MG capsule TAKE 1 CAPSULE BY MOUTH EVERY DAY 90 capsule 1  . furosemide (LASIX) 40 MG tablet TAKE 1 TABLET BY MOUTH TWICE A DAY 180 tablet 1  . HYDROcodone-acetaminophen (NORCO) 10-325 MG tablet Take 1 tablet by mouth every 8 (eight) hours as needed. 90 tablet 0  . HYDROcodone-acetaminophen (NORCO) 10-325 MG tablet Take 1 tablet by mouth every 8 (eight) hours as needed. 90 tablet 0  .  HYDROcodone-acetaminophen (NORCO) 10-325 MG tablet Take 1 tablet by mouth every 8 (eight) hours as needed. 90 tablet 0  . lisinopril-hydrochlorothiazide (ZESTORETIC) 20-12.5 MG tablet TAKE 1 TABLET BY MOUTH EVERY DAY 90 tablet 1  . metoprolol succinate (TOPROL-XL) 50 MG 24 hr tablet TAKE 1 TABLET (50 MG TOTAL) BY MOUTH DAILY. TAKE WITH OR IMMEDIATELY FOLLOWING A MEAL. 90 tablet 1  . Multiple Vitamin (MULTIVITAMIN) capsule Take 1 capsule by mouth daily.      . rivaroxaban (XARELTO) 20 MG TABS tablet Take 20 mg by mouth daily.    . rosuvastatin (CRESTOR) 40 MG tablet TAKE 1 TABLET BY MOUTH EVERY DAY 90 tablet 3  . umeclidinium-vilanterol (ANORO ELLIPTA) 62.5-25 MCG/INH AEPB INHALE 1 PUFF BY MOUTH EVERY DAY 60 each 6  . doxycycline (VIBRA-TABS) 100 MG tablet Take 1 tablet (100 mg total) by mouth 2 (two) times daily. 20 tablet 0  . predniSONE (DELTASONE) 20 MG tablet Take 2 tablets (40 mg total) by mouth daily with breakfast. 10 tablet 0   No facility-administered medications prior to visit.    Allergies  Allergen Reactions  . Nsaids Other (See Comments)    ROS Review of Systems    Objective:    Physical Exam Constitutional:      Appearance: She is well-developed.  HENT:     Head: Normocephalic and atraumatic.  Cardiovascular:     Rate and Rhythm: Normal rate and regular rhythm.     Heart sounds: Normal heart sounds.  Pulmonary:     Effort: Pulmonary effort is normal.     Breath sounds: Normal breath sounds.  Musculoskeletal:     Comments: 1+ pitting edema of both LE.  She has some scale and scabbing.  On the right leg  Skin:    General: Skin is warm and dry.  Neurological:     Mental Status: She is alert and oriented to person, place, and time.  Psychiatric:        Behavior: Behavior normal.     BP 130/67   Pulse (!) 107   Ht 5\' 3"  (1.6 m)   Wt 231 lb (104.8 kg)   SpO2 (!) 88%   BMI 40.92 kg/m  Wt Readings from Last 3 Encounters:  07/23/20 231 lb (104.8 kg)   07/16/20 256 lb (116.1 kg)  05/18/20 245 lb (111.1 kg)     Health Maintenance Due  Topic Date Due  . DEXA SCAN  Never done    There are no preventive care reminders to  display for this patient.  Lab Results  Component Value Date   TSH 1.75 08/22/2018   Lab Results  Component Value Date   WBC 9.1 07/20/2020   HGB 13.1 07/20/2020   HCT 42.8 07/20/2020   MCV 94.9 07/20/2020   PLT 127 (L) 07/20/2020   Lab Results  Component Value Date   NA 138 07/23/2020   K 3.5 07/23/2020   CO2 35 (H) 07/23/2020   GLUCOSE 198 (H) 07/23/2020   BUN 22 07/23/2020   CREATININE 0.89 07/23/2020   BILITOT 0.6 11/21/2019   ALKPHOS 95 08/22/2016   AST 18 11/21/2019   ALT 57 (H) 11/21/2019   PROT 6.4 11/21/2019   ALBUMIN 4.3 08/22/2016   CALCIUM 10.1 07/23/2020   ANIONGAP 7 03/02/2016   Lab Results  Component Value Date   CHOL 137 11/21/2019   Lab Results  Component Value Date   HDL 47 (L) 11/21/2019   Lab Results  Component Value Date   LDLCALC 72 11/21/2019   Lab Results  Component Value Date   TRIG 99 11/21/2019   Lab Results  Component Value Date   CHOLHDL 2.9 11/21/2019   Lab Results  Component Value Date   HGBA1C 5.8 (A) 11/19/2019      Assessment & Plan:   Problem List Items Addressed This Visit      Cardiovascular and Mediastinum   HYPERTENSION, BENIGN    Well controlled. Continue current regimen. Follow up in  2-3 mo      Relevant Orders   BASIC METABOLIC PANEL WITH GFR (Completed)   Acute heart failure ([place])    Will get Echo.  Ordered but she hasn't scheduled it yet.  Suspect systolic HF.  Will likely need Entresto/ carvedilol and possible spironolactone.  She is down 24 lbs.  Ok to dec furosemide to 2 tabs in AM and 1 midday. Check potassium and Cr today.        Relevant Orders   CT Chest Wo Contrast     Respiratory   Hypoxia   Relevant Medications   AMBULATORY NON FORMULARY MEDICATION   Other Relevant Orders   CT Chest Wo Contrast    Other  Visit Diagnoses    SOB (shortness of breath)    -  Primary   Relevant Medications   AMBULATORY NON FORMULARY MEDICATION   Other Relevant Orders   BASIC METABOLIC PANEL WITH GFR (Completed)   CT Chest Wo Contrast      Hypoxia - will order oxygen to start 2 L. Unable to do walk test.  Oxygen 88% at rest in office today.  Recent CO2 levels on her blood work were elevated as well.  Meds ordered this encounter  Medications  . AMBULATORY NON FORMULARY MEDICATION    Sig: Medication Name:2 liter oxygen with supplies including portable oxygen. Dx hypoxia.  88% at rest.  See OV note 07/23/20. Please fax to [facility]    Dispense:  1 Units    Refill:  0    Follow-up: Return in about 4 weeks (around 08/20/2020).    Beatrice Lecher, MD

## 2020-07-23 NOTE — Assessment & Plan Note (Signed)
Well controlled. Continue current regimen. Follow up in  2-3 mo

## 2020-07-23 NOTE — Assessment & Plan Note (Signed)
Will get Echo.  Ordered but she hasn't scheduled it yet.  Suspect systolic HF.  Will likely need Entresto/ carvedilol and possible spironolactone.  She is down 24 lbs.  Ok to dec furosemide to 2 tabs in AM and 1 midday. Check potassium and Cr today.

## 2020-07-23 NOTE — Patient Instructions (Addendum)
OK to decrease furosemide to 2 in AM and 1 in afternoon.       Low-Sodium Eating Plan Sodium, which is an element that makes up salt, helps you maintain a healthy balance of fluids in your body. Too much sodium can increase your blood pressure and cause fluid and waste to be held in your body. Your health care provider or dietitian may recommend following this plan if you have high blood pressure (hypertension), kidney disease, liver disease, or heart failure. Eating less sodium can help lower your blood pressure, reduce swelling, and protect your heart, liver, and kidneys. What are tips for following this plan? Reading food labels  The Nutrition Facts label lists the amount of sodium in one serving of the food. If you eat more than one serving, you must multiply the listed amount of sodium by the number of servings.  Choose foods with less than 140 mg of sodium per serving.  Avoid foods with 300 mg of sodium or more per serving. Shopping  Look for lower-sodium products, often labeled as "low-sodium" or "no salt added."  Always check the sodium content, even if foods are labeled as "unsalted" or "no salt added."  Buy fresh foods. ? Avoid canned foods and pre-made or frozen meals. ? Avoid canned, cured, or processed meats.  Buy breads that have less than 80 mg of sodium per slice.   Cooking  Eat more home-cooked food and less restaurant, buffet, and fast food.  Avoid adding salt when cooking. Use salt-free seasonings or herbs instead of table salt or sea salt. Check with your health care provider or pharmacist before using salt substitutes.  Cook with plant-based oils, such as canola, sunflower, or olive oil.   Meal planning  When eating at a restaurant, ask that your food be prepared with less salt or no salt, if possible. Avoid dishes labeled as brined, pickled, cured, smoked, or made with soy sauce, miso, or teriyaki sauce.  Avoid foods that contain MSG (monosodium glutamate).  MSG is sometimes added to [place] food, bouillon, and some canned foods.  Make meals that can be grilled, baked, poached, roasted, or steamed. These are generally made with less sodium. General information Most people on this plan should limit their sodium intake to 1,500-2,000 mg (milligrams) of sodium each day. What foods should I eat? Fruits Fresh, frozen, or canned fruit. Fruit juice. Vegetables Fresh or frozen vegetables. "No salt added" canned vegetables. "No salt added" tomato sauce and paste. Low-sodium or reduced-sodium tomato and vegetable juice. Grains Low-sodium cereals, including oats, puffed wheat and rice, and shredded wheat. Low-sodium crackers. Unsalted rice. Unsalted pasta. Low-sodium bread. Whole-grain breads and whole-grain pasta. Meats and other proteins Fresh or frozen (no salt added) meat, poultry, seafood, and fish. Low-sodium canned tuna and salmon. Unsalted nuts. Dried peas, beans, and lentils without added salt. Unsalted canned beans. Eggs. Unsalted nut butters. Dairy Milk. Soy milk. Cheese that is naturally low in sodium, such as ricotta cheese, fresh mozzarella, or Swiss cheese. Low-sodium or reduced-sodium cheese. Cream cheese. Yogurt. Seasonings and condiments Fresh and dried herbs and spices. Salt-free seasonings. Low-sodium mustard and ketchup. Sodium-free salad dressing. Sodium-free light mayonnaise. Fresh or refrigerated horseradish. Lemon juice. Vinegar. Other foods Homemade, reduced-sodium, or low-sodium soups. Unsalted popcorn and pretzels. Low-salt or salt-free chips. The items listed above may not be a complete list of foods and beverages you can eat. Contact a dietitian for more information. What foods should I avoid? Vegetables Sauerkraut, pickled vegetables, and relishes. Olives. [place]  fries. Onion rings. Regular canned vegetables (not low-sodium or reduced-sodium). Regular canned tomato sauce and paste (not low-sodium or reduced-sodium). Regular  tomato and vegetable juice (not low-sodium or reduced-sodium). Frozen vegetables in sauces. Grains Instant hot cereals. Bread stuffing, pancake, and biscuit mixes. Croutons. Seasoned rice or pasta mixes. Noodle soup cups. Boxed or frozen macaroni and cheese. Regular salted crackers. Self-rising flour. Meats and other proteins Meat or fish that is salted, canned, smoked, spiced, or pickled. Precooked or cured meat, such as sausages or meat loaves. Berniece Salines. Ham. Pepperoni. Hot dogs. Corned beef. Chipped beef. Salt pork. Jerky. Pickled herring. Anchovies and sardines. Regular canned tuna. Salted nuts. Dairy Processed cheese and cheese spreads. Hard cheeses. Cheese curds. Blue cheese. Feta cheese. String cheese. Regular cottage cheese. Buttermilk. Canned milk. Fats and oils Salted butter. Regular margarine. Ghee. Bacon fat. Seasonings and condiments Onion salt, garlic salt, seasoned salt, table salt, and sea salt. Canned and packaged gravies. Worcestershire sauce. Tartar sauce. Barbecue sauce. Teriyaki sauce. Soy sauce, including reduced-sodium. Steak sauce. Fish sauce. Oyster sauce. Cocktail sauce. Horseradish that you find on the shelf. Regular ketchup and mustard. Meat flavorings and tenderizers. Bouillon cubes. Hot sauce. Pre-made or packaged marinades. Pre-made or packaged taco seasonings. Relishes. Regular salad dressings. Salsa. Other foods Salted popcorn and pretzels. Corn chips and puffs. Potato and tortilla chips. Canned or dried soups. Pizza. Frozen entrees and pot pies. The items listed above may not be a complete list of foods and beverages you should avoid. Contact a dietitian for more information. Summary  Eating less sodium can help lower your blood pressure, reduce swelling, and protect your heart, liver, and kidneys.  Most people on this plan should limit their sodium intake to 1,500-2,000 mg (milligrams) of sodium each day.  Canned, boxed, and frozen foods are high in sodium.  Restaurant foods, fast foods, and pizza are also very high in sodium. You also get sodium by adding salt to food.  Try to cook at home, eat more fresh fruits and vegetables, and eat less fast food and canned, processed, or prepared foods. This information is not intended to replace advice given to you by your health care provider. Make sure you discuss any questions you have with your health care provider. Document Revised: 04/05/2019 Document Reviewed: 01/30/2019 Elsevier Patient Education  2021 [employer].

## 2020-07-24 LAB — BASIC METABOLIC PANEL WITH GFR
BUN: 22 mg/dL (ref 7–25)
CO2: 35 mmol/L — ABNORMAL HIGH (ref 20–32)
Calcium: 10.1 mg/dL (ref 8.6–10.4)
Chloride: 93 mmol/L — ABNORMAL LOW (ref 98–110)
Creat: 0.89 mg/dL (ref 0.50–0.99)
GFR, Est African American: 78 mL/min/{1.73_m2} (ref >=60)
GFR, Est Non African American: 67 mL/min/{1.73_m2} (ref >=60)
Glucose, Bld: 198 mg/dL — ABNORMAL HIGH (ref 65–99)
Potassium: 3.5 mmol/L (ref 3.5–5.3)
Sodium: 138 mmol/L (ref 135–146)

## 2020-07-24 MED ORDER — AMBULATORY NON FORMULARY MEDICATION: 0 refills | Status: DC

## 2020-07-29 ENCOUNTER — Ambulatory Visit (INDEPENDENT_AMBULATORY_CARE_PROVIDER_SITE_OTHER): Payer: Medicare Other

## 2020-07-29 ENCOUNTER — Other Ambulatory Visit: Payer: Self-pay

## 2020-07-29 DIAGNOSIS — Z0000 Encounter for general adult medical examination without abnormal findings: Secondary | ICD-10-CM

## 2020-07-29 DIAGNOSIS — M85852 Other specified disorders of bone density and structure, left thigh: Secondary | ICD-10-CM | POA: Diagnosis not present

## 2020-07-29 DIAGNOSIS — Z780 Asymptomatic menopausal state: Secondary | ICD-10-CM

## 2020-08-18 ENCOUNTER — Telehealth: Payer: Self-pay | Admitting: Family Medicine

## 2020-08-18 ENCOUNTER — Other Ambulatory Visit: Payer: Self-pay

## 2020-08-18 ENCOUNTER — Ambulatory Visit (INDEPENDENT_AMBULATORY_CARE_PROVIDER_SITE_OTHER): Payer: Medicare Other | Admitting: Family Medicine

## 2020-08-18 VITALS — BP 106/46 | HR 95 | Ht 63.0 in | Wt 221.0 lb

## 2020-08-18 DIAGNOSIS — I10 Essential (primary) hypertension: Secondary | ICD-10-CM

## 2020-08-18 DIAGNOSIS — G8929 Other chronic pain: Secondary | ICD-10-CM

## 2020-08-18 DIAGNOSIS — R7301 Impaired fasting glucose: Secondary | ICD-10-CM | POA: Diagnosis not present

## 2020-08-18 DIAGNOSIS — E8779 Other fluid overload: Secondary | ICD-10-CM

## 2020-08-18 DIAGNOSIS — I509 Heart failure, unspecified: Secondary | ICD-10-CM | POA: Diagnosis not present

## 2020-08-18 MED ORDER — LISINOPRIL 10 MG PO TABS: 10.0000 mg | ORAL_TABLET | Freq: Every day | ORAL | 0 refills | Status: DC

## 2020-08-18 NOTE — Assessment & Plan Note (Signed)
Due to recheck a1C. Will order on labs  Lab Results  Component Value Date   HGBA1C 5.8 (A) 11/19/2019

## 2020-08-18 NOTE — Progress Notes (Signed)
Established Patient Office Visit  Subjective:  Patient ID: Kelly Roth, female    DOB: 04/26/1950  Age: 70 y.o. MRN: [id number]  CC:  Chief Complaint  Patient presents with  . Pain Management    HPI Kelly Roth presents for a month follow-up chronic pain management for chronic lumbar pain secondary to lumbar laminectomy with decompression and microdiscectomy with persistent pain.  Currently on piroxicam, Robaxin, Cymbalta, and hydrocodone..  She also reports she is been having a lot of problems with her knees.  It is continuing to impact her hips as well.  History of left knee replacement.  She can be flying back to [place] next week and will be gone for couple of months.  Says she does need her prescriptions.  Follow-up congestive heart failure she is actually feeling much better she still has not had her echocardiogram scheduled so we will check on that again today I am not sure if she is just not getting phone calls but again will verify with our referral coordinator.  She is actually down about 10 more pounds she says she is actually trying to eat more healthy and do well.  She is able to speak in sentences without feeling short of breath today she is doing a little bit better but still feels weak.  Past Medical History:  Diagnosis Date  . Adenomatous polyps 2004    Colonoscopy done in [facility]  . Arthritis   . Back pain   . Complication of anesthesia   . COPD (chronic obstructive pulmonary disease) ([place])   . Dyspnea    pain contributes but also pt. reveals that she has been out of her Spiriva for one month +  . GERD (gastroesophageal reflux disease)   . Hemorrhoids   . Hyperlipidemia   . Hypertension   . Osteoarthritis   . PONV (postoperative nausea and vomiting)    scopolamine patch used with success with a prev. surg.     Past Surgical History:  Procedure Laterality Date  . CHOLECYSTECTOMY  05-08-07  . JOINT REPLACEMENT Bilateral   . left knee  replacement  3-07   right on 2-06  . LUMBAR LAMINECTOMY/DECOMPRESSION MICRODISCECTOMY N/A 03/01/2016   Procedure: LEFT AND CENTRAL L3-4 AND LEFT L4-5 DECOMPRESSIVE LUMBAR LAMINECTOMY FOR SPINAL STENOSIS;  Surgeon: Jessy Oto, MD;  Location: [place];  Service: Orthopedics;  Laterality: N/A;  . MULTIPLE TOOTH EXTRACTIONS      Family History  Problem Relation Age of Onset  . Depression Mother   . Hypertension Mother   . Hyperlipidemia Mother   . COPD Mother   . Lung cancer Mother   . Hyperlipidemia Father   . Hypertension Father   . Breast cancer Maternal Grandmother   . Colon cancer Neg Hx        Neg     Social History   Socioeconomic History  . Marital status: Married    Spouse name: Not on file  . Number of children: Not on file  . Years of education: Not on file  . Highest education level: Not on file  Occupational History  . Occupation: Caregiver  Tobacco Use  . Smoking status: Current Every Day Smoker    Packs/day: 0.50    Types: Cigarettes  . Smokeless tobacco: Never Used  . Tobacco comment: very rare use, she remarks "I had one cigarette today, reports thia is the first one in a month"  Vaping Use  . Vaping Use: Never used  Substance  and Sexual Activity  . Alcohol use: Yes    Alcohol/week: 0.0 standard drinks    Comment: one drink a week   . Drug use: No  . Sexual activity: Not on file  Other Topics Concern  . 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   Social Determinants of Health   Financial Resource Strain: Not on file  Food Insecurity: Not on file  Transportation Needs: Not on file  Physical Activity: Not on file  Stress: Not on file  Social Connections: Not on file  Intimate Partner Violence: Not on file    Outpatient Medications Prior to Visit  Medication Sig Dispense Refill  . albuterol (VENTOLIN HFA) 108 (90 Base) MCG/ACT inhaler TAKE 2 PUFFS BY MOUTH EVERY 6 HOURS AS NEEDED FOR WHEEZE OR  SHORTNESS OF BREATH 18 g 3  . furosemide (LASIX) 40 MG tablet TAKE 1 TABLET BY MOUTH TWICE A DAY 180 tablet 1  . HYDROcodone-acetaminophen (NORCO) 10-325 MG tablet Take 1 tablet by mouth every 8 (eight) hours as needed. 90 tablet 0  . HYDROcodone-acetaminophen (NORCO) 10-325 MG tablet Take 1 tablet by mouth every 8 (eight) hours as needed. 90 tablet 0  . metoprolol succinate (TOPROL-XL) 50 MG 24 hr tablet TAKE 1 TABLET (50 MG TOTAL) BY MOUTH DAILY. TAKE WITH OR IMMEDIATELY FOLLOWING A MEAL. 90 tablet 1  . Multiple Vitamin (MULTIVITAMIN) capsule Take 1 capsule by mouth daily.      . rivaroxaban (XARELTO) 20 MG TABS tablet Take 20 mg by mouth daily.    . rosuvastatin (CRESTOR) 40 MG tablet TAKE 1 TABLET BY MOUTH EVERY DAY 90 tablet 3  . umeclidinium-vilanterol (ANORO ELLIPTA) 62.5-25 MCG/INH AEPB INHALE 1 PUFF BY MOUTH EVERY DAY 60 each 6  . DULoxetine (CYMBALTA) 60 MG capsule TAKE 1 CAPSULE BY MOUTH EVERY DAY 90 capsule 1  . HYDROcodone-acetaminophen (NORCO) 10-325 MG tablet Take 1 tablet by mouth every 8 (eight) hours as needed. 90 tablet 0  . lisinopril-hydrochlorothiazide (ZESTORETIC) 20-12.5 MG tablet TAKE 1 TABLET BY MOUTH EVERY DAY 90 tablet 1  . AMBULATORY NON FORMULARY MEDICATION Medication Name:2 liter oxygen with supplies including portable oxygen. Dx hypoxia.  88% at rest.  See OV note 07/23/20. Please fax to [facility] (Patient not taking: Reported on 08/18/2020) 1 Units 0   No facility-administered medications prior to visit.    Allergies  Allergen Reactions  . Nsaids Other (See Comments)    ROS Review of Systems    Objective:    Physical Exam Constitutional:      Appearance: She is well-developed.  HENT:     Head: Normocephalic and atraumatic.  Cardiovascular:     Rate and Rhythm: Normal rate and regular rhythm.     Heart sounds: Normal heart sounds.  Pulmonary:     Effort: Pulmonary effort is normal.     Breath sounds: Normal breath sounds.  Skin:    General: Skin is  warm and dry.  Neurological:     Mental Status: She is alert and oriented to person, place, and time.  Psychiatric:        Behavior: Behavior normal.     BP (!) 106/46   Pulse 95   Ht 5\' 3"  (1.6 m)   Wt 221 lb (100.2 kg)   SpO2 93%   BMI 39.15 kg/m  Wt Readings from Last 3 Encounters:  08/18/20 221 lb (100.2 kg)  07/23/20 231 lb (104.8 kg)  07/16/20 256 lb (  116.1 kg)     Health Maintenance Due  Topic Date Due  . Pneumococcal Vaccine 47-62 Years old (1 of 4 - PCV13) Never done    There are no preventive care reminders to display for this patient.  Lab Results  Component Value Date   TSH 1.75 08/22/2018   Lab Results  Component Value Date   WBC 9.1 07/20/2020   HGB 13.1 07/20/2020   HCT 42.8 07/20/2020   MCV 94.9 07/20/2020   PLT 127 (L) 07/20/2020   Lab Results  Component Value Date   NA 139 08/18/2020   K 3.5 08/18/2020   CO2 37 (H) 08/18/2020   GLUCOSE 139 (H) 08/18/2020   BUN 19 08/18/2020   CREATININE 0.88 08/18/2020   BILITOT 0.6 11/21/2019   ALKPHOS 95 08/22/2016   AST 18 11/21/2019   ALT 57 (H) 11/21/2019   PROT 6.4 11/21/2019   ALBUMIN 4.3 08/22/2016   CALCIUM 10.9 (H) 08/18/2020   ANIONGAP 7 03/02/2016   Lab Results  Component Value Date   CHOL 137 11/21/2019   Lab Results  Component Value Date   HDL 47 (L) 11/21/2019   Lab Results  Component Value Date   LDLCALC 72 11/21/2019   Lab Results  Component Value Date   TRIG 99 11/21/2019   Lab Results  Component Value Date   CHOLHDL 2.9 11/21/2019   Lab Results  Component Value Date   HGBA1C 5.9 (H) 08/18/2020      Assessment & Plan:   Problem List Items Addressed This Visit      Cardiovascular and Mediastinum   HYPERTENSION, BENIGN - Primary    BP looks great but is a little low today. Will stop HCTZ and dec lisinopril.  F/U in 3 months.        Relevant Medications   lisinopril (ZESTRIL) 10 MG tablet   Other Relevant Orders   Basic Metabolic Panel (BMET) (Completed)    HgB A1c (Completed)   Acute heart failure (HCC)    Suspects systolic heart failure.  But need to get echocardiogram to better evaluate if she has preserved EF or not.  Ultimately will need to make some adjustments to her medication regimen to make sure that she is possibly on Entresto, carvedilol, and possibly spironolactone.  But again I really need to get echocardiogram for further work-up and evaluation.  At this point she seems to have recovered really well honestly she is down an additional 10 pounds and BMI is now 39 she is able to speak without shortness of breath.      Relevant Medications   lisinopril (ZESTRIL) 10 MG tablet     Endocrine   IFG (impaired fasting glucose)    Due to recheck a1C. Will order on labs  Lab Results  Component Value Date   HGBA1C 5.8 (A) 11/19/2019         Relevant Orders   Basic Metabolic Panel (BMET) (Completed)   HgB A1c (Completed)     Other   Encounter for chronic pain management    Hydrocodone 7.5/325 1 po TID #90/mo History of lumbar laminectomy with decompression and microdiscectomy December 2017. Additional medications: Piroxicam, Robaxin, Cymbalta Last UDS date:02/18/2020 - monitor for alcohol Opioid Treatment Agreement signed (Y/N):02/18/2020 Opioid Treatment Agreement last reviewed with patient:  NCCSRS reviewed this encounter (include red flags):Yes        Low blood pressure today-change lisinopril HCTZ to plain lisinopril 10 mg.  New prescription sent to pharmacy since she is already taking  the Lasix daily.  Reminded them able to back down on that when I see her back.  Continue with metoprolol 50 but again consider switching to carvedilol.  Meds ordered this encounter  Medications  . lisinopril (ZESTRIL) 10 MG tablet    Sig: Take 1 tablet (10 mg total) by mouth daily.    Dispense:  90 tablet    Refill:  0    Follow-up: Return in about 3 months (around 11/18/2020) for Chronic Pain Management.    Beatrice Lecher, MD

## 2020-08-18 NOTE — Telephone Encounter (Signed)
Jenny Reichmann ,can you check on her echo. We ordered it last month. Thank you!!

## 2020-08-18 NOTE — Assessment & Plan Note (Addendum)
BP looks great but is a little low today. Will stop HCTZ and dec lisinopril.  F/U in 3 months.

## 2020-08-19 ENCOUNTER — Encounter: Payer: Self-pay | Admitting: Family Medicine

## 2020-08-19 ENCOUNTER — Other Ambulatory Visit: Payer: Self-pay | Admitting: Family Medicine

## 2020-08-19 DIAGNOSIS — G8929 Other chronic pain: Secondary | ICD-10-CM

## 2020-08-19 LAB — HEMOGLOBIN A1C
Hgb A1c MFr Bld: 5.9 % of total Hgb — ABNORMAL HIGH (ref <5.7)
Mean Plasma Glucose: 123 mg/dL
eAG (mmol/L): 6.8 mmol/L

## 2020-08-19 LAB — BASIC METABOLIC PANEL
BUN: 19 mg/dL (ref 7–25)
CO2: 37 mmol/L — ABNORMAL HIGH (ref 20–32)
Calcium: 10.9 mg/dL — ABNORMAL HIGH (ref 8.6–10.4)
Chloride: 96 mmol/L — ABNORMAL LOW (ref 98–110)
Creat: 0.88 mg/dL (ref 0.50–0.99)
Glucose, Bld: 139 mg/dL — ABNORMAL HIGH (ref 65–99)
Potassium: 3.5 mmol/L (ref 3.5–5.3)
Sodium: 139 mmol/L (ref 135–146)

## 2020-08-19 MED ORDER — HYDROCODONE-ACETAMINOPHEN 10-325 MG PO TABS: 1.0000 | ORAL_TABLET | Freq: Three times a day (TID) | ORAL | 0 refills | Status: DC | PRN

## 2020-08-19 NOTE — Telephone Encounter (Signed)
Seen yesterday.  Marland Kitchen.PDMP reviewed during this encounter. No concerns. Sent 90 day rx.

## 2020-08-19 NOTE — Assessment & Plan Note (Signed)
Hydrocodone 7.5/325 1 po TID #90/mo History of lumbar laminectomy with decompression and microdiscectomy December 2017. Additional medications: Piroxicam, Robaxin, Cymbalta Last UDS date:02/18/2020 - monitor for alcohol Opioid Treatment Agreement signed (Y/N):02/18/2020 Opioid Treatment Agreement last reviewed with patient:  NCCSRS reviewed this encounter (include red flags):Yes

## 2020-08-19 NOTE — Assessment & Plan Note (Signed)
Suspects systolic heart failure.  But need to get echocardiogram to better evaluate if she has preserved EF or not.  Ultimately will need to make some adjustments to her medication regimen to make sure that she is possibly on Entresto, carvedilol, and possibly spironolactone.  But again I really need to get echocardiogram for further work-up and evaluation.  At this point she seems to have recovered really well honestly she is down an additional 10 pounds and BMI is now 39 she is able to speak without shortness of breath.

## 2020-08-19 NOTE — Telephone Encounter (Signed)
Dr. Madilyn Fireman she is scheduled for 6/22 in [place] for her Echo - [facility]

## 2020-09-02 ENCOUNTER — Ambulatory Visit (HOSPITAL_BASED_OUTPATIENT_CLINIC_OR_DEPARTMENT_OTHER): Payer: Medicare Other

## 2020-09-17 ENCOUNTER — Other Ambulatory Visit: Payer: Self-pay | Admitting: Family Medicine

## 2020-09-17 DIAGNOSIS — I10 Essential (primary) hypertension: Secondary | ICD-10-CM

## 2020-09-18 MED ORDER — HYDROCODONE-ACETAMINOPHEN 10-325 MG PO TABS: 1.0000 | ORAL_TABLET | Freq: Three times a day (TID) | ORAL | 0 refills | Status: DC | PRN

## 2020-09-23 ENCOUNTER — Other Ambulatory Visit: Payer: Self-pay

## 2020-09-23 ENCOUNTER — Ambulatory Visit (HOSPITAL_BASED_OUTPATIENT_CLINIC_OR_DEPARTMENT_OTHER)
Admission: RE | Admit: 2020-09-23 | Discharge: 2020-09-23 | Disposition: A | Discharge status: Home / Self Care | Payer: Medicare Other | Source: Ambulatory Visit | Attending: Family Medicine | Admitting: Family Medicine

## 2020-09-23 DIAGNOSIS — R0602 Shortness of breath: Secondary | ICD-10-CM

## 2020-09-23 DIAGNOSIS — I509 Heart failure, unspecified: Secondary | ICD-10-CM | POA: Insufficient documentation

## 2020-09-23 LAB — ECHOCARDIOGRAM COMPLETE
AV Mean grad: 3 mmHg
AV Peak grad: 4.5 mmHg
Ao pk vel: 1.06 m/s
Area-P 1/2: 5.31 cm2
MV M vel: 5.85 m/s
MV Peak grad: 136.9 mmHg
S' Lateral: 3.25 cm
Single Plane A4C EF: 60.4 %

## 2020-09-23 NOTE — Progress Notes (Signed)
*  PRELIMINARY RESULTS* Echocardiogram 2D Echocardiogram has been performed.  Luisa Hart RDCS 09/23/2020, 2:05 PM

## 2020-09-25 ENCOUNTER — Telehealth: Payer: Self-pay | Admitting: *Deleted

## 2020-09-25 ENCOUNTER — Other Ambulatory Visit: Payer: Self-pay | Admitting: *Deleted

## 2020-09-25 NOTE — Telephone Encounter (Signed)
Pt called asking about getting an appt scheduled for her wellness done.

## 2020-10-08 ENCOUNTER — Ambulatory Visit (INDEPENDENT_AMBULATORY_CARE_PROVIDER_SITE_OTHER): Payer: Medicare Other | Admitting: Family Medicine

## 2020-10-08 DIAGNOSIS — Z0000 Encounter for general adult medical examination without abnormal findings: Secondary | ICD-10-CM | POA: Diagnosis not present

## 2020-10-08 DIAGNOSIS — F17200 Nicotine dependence, unspecified, uncomplicated: Secondary | ICD-10-CM

## 2020-10-08 DIAGNOSIS — Z122 Encounter for screening for malignant neoplasm of respiratory organs: Secondary | ICD-10-CM | POA: Diagnosis not present

## 2020-10-08 NOTE — Addendum Note (Signed)
Addended by: Tinnie Gens on: 10/08/2020 10:01 AM   Modules accepted: Orders, SmartSet

## 2020-10-08 NOTE — Patient Instructions (Addendum)
[place] Maintenance Summary and Written Plan of Care  Ms. Kelly Roth ,  Thank you for allowing me to perform your Medicare Annual Wellness Visit and for your ongoing commitment to your health.   Health Maintenance & Immunization History Health Maintenance  Topic Date Due   COVID-19 Vaccine (4 - Booster for Pfizer series) 10/24/2020 (Originally 04/14/2020)   COLONOSCOPY (Pts 45-54yr Insurance coverage will need to be confirmed)  05/18/2021 (Originally 02/20/2018)   PNA vac Low Risk Adult (2 of 2 - PCV13) 05/18/2021 (Originally 05/14/2019)   INFLUENZA VACCINE  10/12/2020   MAMMOGRAM  04/15/2022   TETANUS/TDAP  11/26/2028   DEXA SCAN  Completed   Hepatitis C Screening  Completed   Zoster Vaccines- Shingrix  Completed   HPV VACCINES  Aged Out   Immunization History  Administered Date(s) Administered   Fluad Quad(high Dose 65+) 11/19/2019   Influenza Split 02/22/2011, 03/15/2012, 03/15/2012   Influenza, High Dose Seasonal PF 11/27/2018   Influenza,inj,Quad PF,6+ Mos 03/26/2014, 04/30/2015   Influenza-Unspecified 02/11/2018   PFIZER(Purple Top)SARS-COV-2 Vaccination 06/07/2019, 07/14/2019, 01/13/2020   Pneumococcal Polysaccharide-23 04/26/2011, 05/14/2018   Td 05/19/2006   Tdap 03/14/2016, 11/27/2018   Zoster Recombinat (Shingrix) 12/10/2018, 02/13/2019   Zoster, Live 03/26/2014    These are the patient goals that we discussed:  Goals Addressed               This Visit's Progress     Patient Stated (pt-stated)        10/08/2020 AWV Goal: Exercise for General Health  Patient will verbalize understanding of the benefits of increased physical activity: Exercising regularly is important. It will improve your overall fitness, flexibility, and endurance. Regular exercise also will improve your overall health. It can help you control your weight, reduce stress, and improve your bone density. Over the next year, patient will increase physical activity  as tolerated with a goal of at least 150 minutes of moderate physical activity per week.  You can tell that you are exercising at a moderate intensity if your heart starts beating faster and you start breathing faster but can still hold a conversation. Moderate-intensity exercise ideas include: Walking 1 mile (1.6 km) in about 15 minutes Biking Hiking Golfing Dancing Water aerobics Patient will verbalize understanding of everyday activities that increase physical activity by providing examples like the following: Yard work, such as: PSales promotion account executiveGardening Washing windows or floors Patient will be able to explain general safety guidelines for exercising:  Before you start a new exercise program, talk with your health care provider. Do not exercise so much that you hurt yourself, feel dizzy, or get very short of breath. Wear comfortable clothes and wear shoes with good support. Drink plenty of water while you exercise to prevent dehydration or heat stroke. Work out until your breathing and your heartbeat get faster.          This is a list of Health Maintenance Items that are overdue or due now: Pneumococcal vaccine  Colorectal cancer screening  Orders/Referrals Placed Today: No orders of the defined types were placed in this encounter.  (Contact our referral department at [phone] you have not spoken with someone about your referral appointment within the next 5 days)    Follow-up Plan Follow-up with MHali Marry MD as planned Schedule your colonoscopy with your GI doctor. If you need a referral, please let u[place]oreaknow.  Referral  for the lung cancer screening has been sent and they will call you to schedule.  Pneumonia vaccine can be done at your next in-office visit or you can schedule a nurse visit.  Medicare wellness visit in one year.  AVS printed and mailed.        Steps to Quit Smoking Smoking tobacco is the leading cause of preventable death. It can affect almost every organ in the body. Smoking puts you and people around you at risk for many serious, long-lasting (chronic) diseases. Quitting smoking can be hard, but it is one of the best things thatyou can do for your health. It is never too late to quit. How do I get ready to quit? When you decide to quit smoking, make a plan to help you succeed. Before you quit: Pick a date to quit. Set a date within the next 2 weeks to give you time to prepare. Write down the reasons why you are quitting. Keep this list in places where you will see it often. Tell your family, friends, and co-workers that you are quitting. Their support is important. Talk with your doctor about the choices that may help you quit. Find out if your health insurance will pay for these treatments. Know the people, places, things, and activities that make you want to smoke (triggers). Avoid them. What first steps can I take to quit smoking? Throw away all cigarettes at home, at work, and in your car. Throw away the things that you use when you smoke, such as ashtrays and lighters. Clean your car. Make sure to empty the ashtray. Clean your home, including curtains and carpets. What can I do to help me quit smoking? Talk with your doctor about taking medicines and seeing a counselor at the same time. You are more likely to succeed when you do both. If you are pregnant or breastfeeding, talk with your doctor about counseling or other ways to quit smoking. Do not take medicine to help you quit smoking unless your doctor tells you to do so. To quit smoking: Quit right away Quit smoking totally, instead of slowly cutting back on how much you smoke over a period of time. Go to counseling. You are more likely to quit if you go to counseling sessions regularly. Take medicine You may take medicines to help you quit. Some medicines need a  prescription, and some you can buy over-the-counter. Some medicines may contain a drug called nicotine to replace the nicotine in cigarettes. Medicines may: Help you to stop having the desire to smoke (cravings). Help to stop the problems that come when you stop smoking (withdrawal symptoms). Your doctor may ask you to use: Nicotine patches, gum, or lozenges. Nicotine inhalers or sprays. Non-nicotine medicine that is taken by mouth. Find resources Find resources and other ways to help you quit smoking and remain smoke-free after you quit. These resources are most helpful when you use them often. They include: Online chats with a Social worker. Phone quitlines. Printed Furniture conservator/restorer. Support groups or group counseling. Text messaging programs. Mobile phone apps. Use apps on your mobile phone or tablet that can help you stick to your quit plan. There are many free apps for mobile phones and tablets as well as websites. Examples include Quit Guide from the [employer] and smokefree.gov  What things can I do to make it easier to quit?  Talk to your family and friends. Ask them to support and encourage you. Call a phone quitline (1-800-QUIT-NOW), reach out to support  groups, or work with a Social worker. Ask people who smoke to not smoke around you. Avoid places that make you want to smoke, such as: Bars. Parties. Smoke-break areas at work. Spend time with people who do not smoke. Lower the stress in your life. Stress can make you want to smoke. Try these things to help your stress: Getting regular exercise. Doing deep-breathing exercises. Doing yoga. Meditating. Doing a body scan. To do this, close your eyes, focus on one area of your body at a time from head to toe. Notice which parts of your body are tense. Try to relax the muscles in those areas. How will I feel when I quit smoking? Day 1 to 3 weeks Within the first 24 hours, you may start to have some problems that come from quitting tobacco.  These problems are very bad 2-3 days after you quit, but they do not often last for more than 2-3 weeks. You may get these symptoms: Mood swings. Feeling restless, nervous, angry, or annoyed. Trouble concentrating. Dizziness. Strong desire for high-sugar foods and nicotine. Weight gain. Trouble pooping (constipation). Feeling like you may vomit (nausea). Coughing or a sore throat. Changes in how the medicines that you take for other issues work in your body. Depression. Trouble sleeping (insomnia). Week 3 and afterward After the first 2-3 weeks of quitting, you may start to notice more positive results, such as: Better sense of smell and taste. Less coughing and sore throat. Slower heart rate. Lower blood pressure. Clearer skin. Better breathing. Fewer sick days. Quitting smoking can be hard. Do not give up if you fail the first time. Some people need to try a few times before they succeed. Do your best to stick to your quit plan, and talk with yourdoctor if you have any questions or concerns. Summary Smoking tobacco is the leading cause of preventable death. Quitting smoking can be hard, but it is one of the best things that you can do for your health. When you decide to quit smoking, make a plan to help you succeed. Quit smoking right away, not slowly over a period of time. When you start quitting, seek help from your doctor, family, or friends. This information is not intended to replace advice given to you by your health care provider. Make sure you discuss any questions you have with your healthcare provider. Document Revised: 11/23/2018 Document Reviewed: 05/19/2018 Elsevier Patient Education  [place].

## 2020-10-08 NOTE — Progress Notes (Addendum)
MEDICARE ANNUAL WELLNESS VISIT  10/08/2020  Telephone Visit Disclaimer This Medicare AWV was conducted by telephone due to national recommendations for restrictions regarding the COVID-19 Pandemic (e.g. social distancing).  I verified, using two identifiers, that I am speaking with Kelly Roth or their authorized healthcare agent. I discussed the limitations, risks, security, and privacy concerns of performing an evaluation and management service by telephone and the potential availability of an in-person appointment in the future. The patient expressed understanding and agreed to proceed.  Location of Patient: Home Location of Provider (nurse):  Provider home  Subjective:    Kelly Roth is a 70 y.o. female patient of Metheney, Rene Kocher, MD who had a Medicare Annual Wellness Visit today via telephone. Kelly Roth is Retired and lives with their spouse. she has 0 children. she reports that she is socially active and does interact with friends/family regularly. she is minimally physically active and enjoys reading and adult coloring.  Patient Care Team: Hali Marry, MD as PCP - General  Advanced Directives 10/08/2020 02/25/2016  Does Patient Have a Medical Advance Directive? Yes Yes  Type of Advance Directive Living will;Healthcare Power of [place];Living will  Does patient want to make changes to medical advance directive? No - Patient declined -  Copy of [place] in Chart? No - copy requested [facility] Utilization Over the Past 12 Months: # of hospitalizations or ER visits: 0 # of surgeries: 1  Review of Systems    Patient reports that her overall health is unchanged compared to last year.  History obtained from chart review and the patient  Patient Reported Readings (BP, Pulse, CBG, Weight, etc) none  Pain Assessment Pain : No/denies pain     Current Medications & Allergies  (verified) Allergies as of 10/08/2020       Reactions   Nsaids Other (See Comments)        Medication List        Accurate as of October 08, 2020  9:59 AM. If you have any questions, ask your nurse or doctor.          albuterol 108 (90 Base) MCG/ACT inhaler Commonly known as: VENTOLIN HFA TAKE 2 PUFFS BY MOUTH EVERY 6 HOURS AS NEEDED FOR WHEEZE OR SHORTNESS OF BREATH   AMBULATORY NON FORMULARY MEDICATION Medication Name:2 liter oxygen with supplies including portable oxygen. Dx hypoxia.  88% at rest.  See OV note 07/23/20. Please fax to [facility]   Anoro Ellipta 62.5-25 MCG/INH Aepb Generic drug: umeclidinium-vilanterol INHALE 1 PUFF BY MOUTH EVERY DAY   DULoxetine 60 MG capsule Commonly known as: CYMBALTA TAKE 1 CAPSULE BY MOUTH EVERY DAY   furosemide 40 MG tablet Commonly known as: LASIX TAKE 1 TABLET BY MOUTH TWICE A DAY   HYDROcodone-acetaminophen 10-325 MG tablet Commonly known as: NORCO Take 1 tablet by mouth every 8 (eight) hours as needed.   HYDROcodone-acetaminophen 10-325 MG tablet Commonly known as: NORCO Take 1 tablet by mouth every 8 (eight) hours as needed.   HYDROcodone-acetaminophen 10-325 MG tablet Commonly known as: NORCO Take 1 tablet by mouth every 8 (eight) hours as needed.   lisinopril 10 MG tablet Commonly known as: ZESTRIL TAKE 1 TABLET BY MOUTH EVERY DAY   lisinopril-hydrochlorothiazide 20-12.5 MG tablet Commonly known as: ZESTORETIC Take 1 tablet by mouth daily.   metoprolol succinate 50 MG 24 hr tablet Commonly known as: TOPROL-XL TAKE 1 TABLET (50 MG TOTAL) BY MOUTH  DAILY. TAKE WITH OR IMMEDIATELY FOLLOWING A MEAL.   multivitamin capsule Take 1 capsule by mouth daily.   rivaroxaban 20 MG Tabs tablet Commonly known as: XARELTO Take 20 mg by mouth daily.   rosuvastatin 40 MG tablet Commonly known as: CRESTOR TAKE 1 TABLET BY MOUTH EVERY DAY        History (reviewed): Past Medical History:  Diagnosis Date   Adenomatous  polyps 2004    Colonoscopy done in [facility]   Arthritis    Back pain    Complication of anesthesia    COPD (chronic obstructive pulmonary disease) (HCC)    Dyspnea    pain contributes but also pt. reveals that she has been out of her Spiriva for one month +   GERD (gastroesophageal reflux disease)    Hemorrhoids    Hyperlipidemia    Hypertension    Osteoarthritis    PONV (postoperative nausea and vomiting)    scopolamine patch used with success with a prev. surg.    Past Surgical History:  Procedure Laterality Date   CATARACT EXTRACTION, BILATERAL Bilateral 04/2020   CHOLECYSTECTOMY  05/08/2007   JOINT REPLACEMENT Bilateral    left knee replacement  05/2005   right on 2-06   LUMBAR LAMINECTOMY/DECOMPRESSION MICRODISCECTOMY N/A 03/01/2016   Procedure: LEFT AND CENTRAL L3-4 AND LEFT L4-5 DECOMPRESSIVE LUMBAR LAMINECTOMY FOR SPINAL STENOSIS;  Surgeon: Jessy Oto, MD;  Location: [facility];  Service: Orthopedics;  Laterality: N/A;   MULTIPLE TOOTH EXTRACTIONS     Family History  Problem Relation Age of Onset   Depression Mother    Hypertension Mother    Hyperlipidemia Mother    COPD Mother    Lung cancer Mother    Hyperlipidemia Father    Hypertension Father    Breast cancer Maternal Grandmother    Colon cancer Neg Hx        Neg    Social History   Socioeconomic History   Marital status: Married    Spouse name: Rashida Devon   Number of children: 0   Years of education: 12   Highest education level: 12th grade  Occupational History   Occupation: Building control surveyor   Occupation: Retired.  Tobacco Use   Smoking status: Every Day    Packs/day: 0.50    Years: 49.00    Pack years: 24.50    Types: Cigarettes   Smokeless tobacco: Never  Vaping Use   Vaping Use: Never used  Substance and Sexual Activity   Alcohol use: Yes    Alcohol/week: 0.0 standard drinks    Comment: one drink a week    Drug use: No   Sexual activity: Not on file  Other Topics Concern   Not on  file  Social History Narrative   Lives with her husband. She enjoys reading and adult coloring.    Social Determinants of Health   Financial Resource Strain: Low Risk    Difficulty of Paying Living Expenses: Not hard at all  Food Insecurity: No Food Insecurity   Worried About Charity fundraiser in the Last Year: Never true   [place] in the Last Year: Never true  Transportation Needs: No Transportation Needs   Lack of Transportation (Medical): No   Lack of Transportation (Non-Medical): No  Physical Activity: Inactive   Days of Exercise per Week: 0 days   Minutes of Exercise per Session: 0 min  Stress: No Stress Concern Present   Feeling of Stress : Not at all  Social  Connections: Socially Isolated   Frequency of Communication with Friends and Family: Once a week   Frequency of Social Gatherings with Friends and Family: Once a week   Attends Religious Services: Never   Marine scientist or Organizations: No   Attends Archivist Meetings: Never   Marital Status: Married    Activities of Daily Living In your present state of health, do you have any difficulty performing the following activities: 10/08/2020  Hearing? N  Vision? N  Difficulty concentrating or making decisions? N  Walking or climbing stairs? Y  Comment just takes it slow  Dressing or bathing? N  Doing errands, shopping? N  Preparing Food and eating ? N  Using the Toilet? N  In the past six months, have you accidently leaked urine? N  Do you have problems with loss of bowel control? N  Managing your Medications? N  Managing your Finances? N  Housekeeping or managing your Housekeeping? N  Some recent data might be hidden    Patient Education/ Literacy How often do you need to have someone help you when you read instructions, pamphlets, or other written materials from your doctor or pharmacy?: 1 - Never What is the last grade level you completed in school?: 12th grade  Exercise Current  Exercise Habits: Home exercise routine, Type of exercise: stretching;walking, Time (Minutes): 30, Frequency (Times/Week): 7, Weekly Exercise (Minutes/Week): 210, Intensity: Mild, Exercise limited by: respiratory conditions(s) (Patient has COPD.)  Diet Patient reports consuming  2-3  meals a day and 1 snack(s) a day Patient reports that her primary diet is: Regular Patient reports that she does have regular access to food.   Depression Screen PHQ 2/9 Scores 10/08/2020 08/18/2020 07/16/2020 05/20/2019 05/20/2019 12/06/2018 11/26/2018  PHQ - 2 Score 0 0 '2 1 1 '$ 0 0  PHQ- 9 Score 0 '2 10 1 1 '$ - 1     Fall Risk Fall Risk  10/08/2020 08/18/2020 07/16/2020 05/20/2019 12/06/2018  Falls in the past year? 0 '1 1 1 '$ 0  Number falls in past yr: 0 0 1 1 0  Injury with Fall? 0 0 0 1 0  Risk for fall due to : No Fall Risks Impaired mobility Impaired mobility - -  Follow up Falls evaluation completed Falls prevention discussed;Falls evaluation completed Falls evaluation completed;Falls prevention discussed - -     Objective:  Kelly Roth seemed alert and oriented and she participated appropriately during our telephone visit.  Blood Pressure Weight BMI  BP Readings from Last 3 Encounters:  08/18/20 (!) 106/46  07/23/20 130/67  07/16/20 (!) 109/58   Wt Readings from Last 3 Encounters:  08/18/20 221 lb (100.2 kg)  07/23/20 231 lb (104.8 kg)  07/16/20 256 lb (116.1 kg)   BMI Readings from Last 1 Encounters:  08/18/20 39.15 kg/m    *Unable to obtain current vital signs, weight, and BMI due to telephone visit type  Hearing/Vision  [place] did not seem to have difficulty with hearing/understanding during the telephone conversation Reports that she has had a formal eye exam by an eye care professional within the past year Reports that she has not had a formal hearing evaluation within the past year *Unable to fully assess hearing and vision during telephone visit type  Cognitive Function: 6CIT Screen  10/08/2020  What Year? 0 points  What month? 0 points  What time? 0 points  Count back from 20 0 points  Months in reverse 2 points  Repeat phrase 2 points  Total Score 4   (Normal:0-7, Significant for Dysfunction: >8)  Normal Cognitive Function Screening: Yes   Immunization & Health Maintenance Record Immunization History  Administered Date(s) Administered   Fluad Quad(high Dose 65+) 11/19/2019   Influenza Split 02/22/2011, 03/15/2012, 03/15/2012   Influenza, High Dose Seasonal PF 11/27/2018   Influenza,inj,Quad PF,6+ Mos 03/26/2014, 04/30/2015   Influenza-Unspecified 02/11/2018   PFIZER(Purple Top)SARS-COV-2 Vaccination 06/07/2019, 07/14/2019, 01/13/2020   Pneumococcal Polysaccharide-23 04/26/2011, 05/14/2018   Td 05/19/2006   Tdap 03/14/2016, 11/27/2018   Zoster Recombinat (Shingrix) 12/10/2018, 02/13/2019   Zoster, Live 03/26/2014    Health Maintenance  Topic Date Due   COVID-19 Vaccine (4 - Booster for [place] series) 10/24/2020 (Originally 04/14/2020)   COLONOSCOPY (Pts 45-10yr Insurance coverage will need to be confirmed)  05/18/2021 (Originally 02/20/2018)   PNA vac Low Risk Adult (2 of 2 - PCV13) 05/18/2021 (Originally 05/14/2019)   INFLUENZA VACCINE  10/12/2020   MAMMOGRAM  04/15/2022   TETANUS/TDAP  11/26/2028   DEXA SCAN  Completed   Hepatitis C Screening  Completed   Zoster Vaccines- Shingrix  Completed   HPV VACCINES  Aged Out       Assessment  This is a routine wellness examination for [employer]  Health Maintenance: Due or Overdue There are no preventive care reminders to display for this patient.   KTheda Belfastdoes not need a referral for Community Assistance: Care Management:   no Social Work:    no Prescription Assistance:  no Nutrition/Diabetes Education:  no   Plan:  Personalized Goals  Goals Addressed               This Visit's Progress     Patient Stated (pt-stated)        10/08/2020 AWV Goal: Exercise for  General Health  Patient will verbalize understanding of the benefits of increased physical activity: Exercising regularly is important. It will improve your overall fitness, flexibility, and endurance. Regular exercise also will improve your overall health. It can help you control your weight, reduce stress, and improve your bone density. Over the next year, patient will increase physical activity as tolerated with a goal of at least 150 minutes of moderate physical activity per week.  You can tell that you are exercising at a moderate intensity if your heart starts beating faster and you start breathing faster but can still hold a conversation. Moderate-intensity exercise ideas include: Walking 1 mile (1.6 km) in about 15 minutes Biking Hiking Golfing Dancing Water aerobics Patient will verbalize understanding of everyday activities that increase physical activity by providing examples like the following: Yard work, such as: PSales promotion account executiveGardening Washing windows or floors Patient will be able to explain general safety guidelines for exercising:  Before you start a new exercise program, talk with your health care provider. Do not exercise so much that you hurt yourself, feel dizzy, or get very short of breath. Wear comfortable clothes and wear shoes with good support. Drink plenty of water while you exercise to prevent dehydration or heat stroke. Work out until your breathing and your heartbeat get faster.        Personalized Health Maintenance & Screening Recommendations  Pneumococcal vaccine  Colorectal cancer screening  Lung Cancer Screening Recommended: yes (Low Dose CT Chest recommended if Age 476-80years, 30 pack-year currently smoking OR have quit w/in past 15 years) Hepatitis C Screening recommended: no HIV Screening recommended: no  Advanced Directives: Written information was not  prepared per patient's request.  Referrals & Orders Orders Placed This Encounter  Procedures   Ambulatory Referral for Lung Cancer Scre     Follow-up Plan Follow-up with Hali Marry, MD as planned Schedule your colonoscopy with your GI doctor. If you need a referral, please let us know.  Referral for the lung cancer screening has been sent and they will call you to schedule.  Pneumonia vaccine can be done at your next in-office visit or you can schedule a nurse visit.  Medicare wellness visit in one year.  AVS printed and mailed.    I have personally reviewed and noted the following in the patient's chart:   Medical and social history Use of alcohol, tobacco or illicit drugs  Current medications and supplements Functional ability and status Nutritional status Physical activity Advanced directives List of other physicians Hospitalizations, surgeries, and ER visits in previous 12 months Vitals Screenings to include cognitive, depression, and falls Referrals and appointments  In addition, I have reviewed and discussed with Kelly Roth certain preventive protocols, quality metrics, and best practice recommendations. A written personalized care plan for preventive services as well as general preventive health recommendations is available and can be mailed to the patient at her request.      Tinnie Gens, RN  10/08/2020

## 2020-10-15 ENCOUNTER — Other Ambulatory Visit: Payer: Self-pay

## 2020-10-15 ENCOUNTER — Other Ambulatory Visit: Payer: Self-pay | Admitting: Family Medicine

## 2020-10-15 MED ORDER — HYDROCODONE-ACETAMINOPHEN 10-325 MG PO TABS: 1.0000 | ORAL_TABLET | Freq: Three times a day (TID) | ORAL | 0 refills | Status: DC | PRN

## 2020-10-15 NOTE — Telephone Encounter (Signed)
Pt called requesting refill of hydrocodone.  Request sent to Dr. Madilyn Fireman for approval.  Charyl Bigger, CMA

## 2020-10-16 ENCOUNTER — Other Ambulatory Visit: Payer: Self-pay | Admitting: Family Medicine

## 2020-10-22 ENCOUNTER — Other Ambulatory Visit: Payer: Self-pay | Admitting: Family Medicine

## 2020-10-22 DIAGNOSIS — J449 Chronic obstructive pulmonary disease, unspecified: Secondary | ICD-10-CM

## 2020-10-22 DIAGNOSIS — R0602 Shortness of breath: Secondary | ICD-10-CM

## 2020-11-19 ENCOUNTER — Telehealth: Payer: Self-pay | Admitting: Family Medicine

## 2020-11-19 ENCOUNTER — Ambulatory Visit (INDEPENDENT_AMBULATORY_CARE_PROVIDER_SITE_OTHER): Payer: Medicare Other | Admitting: Family Medicine

## 2020-11-19 ENCOUNTER — Encounter: Payer: Self-pay | Admitting: Family Medicine

## 2020-11-19 VITALS — BP 108/68 | HR 74 | Ht 63.0 in | Wt 222.1 lb

## 2020-11-19 DIAGNOSIS — I10 Essential (primary) hypertension: Secondary | ICD-10-CM | POA: Diagnosis not present

## 2020-11-19 DIAGNOSIS — R0602 Shortness of breath: Secondary | ICD-10-CM | POA: Diagnosis not present

## 2020-11-19 DIAGNOSIS — M25552 Pain in left hip: Secondary | ICD-10-CM | POA: Diagnosis not present

## 2020-11-19 DIAGNOSIS — Z23 Encounter for immunization: Secondary | ICD-10-CM | POA: Diagnosis not present

## 2020-11-19 DIAGNOSIS — G8929 Other chronic pain: Secondary | ICD-10-CM | POA: Diagnosis not present

## 2020-11-19 DIAGNOSIS — I4891 Unspecified atrial fibrillation: Secondary | ICD-10-CM | POA: Diagnosis not present

## 2020-11-19 DIAGNOSIS — I509 Heart failure, unspecified: Secondary | ICD-10-CM | POA: Diagnosis not present

## 2020-11-19 DIAGNOSIS — I483 Typical atrial flutter: Secondary | ICD-10-CM | POA: Diagnosis not present

## 2020-11-19 DIAGNOSIS — J449 Chronic obstructive pulmonary disease, unspecified: Secondary | ICD-10-CM

## 2020-11-19 MED ORDER — ANORO ELLIPTA 62.5-25 MCG/INH IN AEPB: INHALATION_SPRAY | RESPIRATORY_TRACT | 6 refills | Status: DC

## 2020-11-19 MED ORDER — HYDROCODONE-ACETAMINOPHEN 10-325 MG PO TABS: 1.0000 | ORAL_TABLET | Freq: Three times a day (TID) | ORAL | 0 refills | Status: DC | PRN

## 2020-11-19 MED ORDER — CARVEDILOL 3.125 MG PO TABS: 3.1250 mg | ORAL_TABLET | Freq: Two times a day (BID) | ORAL | 2 refills | Status: DC

## 2020-11-19 MED ORDER — DULOXETINE HCL 60 MG PO CPEP: ORAL_CAPSULE | ORAL | 1 refills | Status: AC

## 2020-11-19 MED ORDER — FUROSEMIDE 40 MG PO TABS: 40.0000 mg | ORAL_TABLET | Freq: Two times a day (BID) | ORAL | 1 refills | Status: DC

## 2020-11-19 MED ORDER — METOPROLOL SUCCINATE ER 50 MG PO TB24: 50.0000 mg | ORAL_TABLET | Freq: Every day | ORAL | 1 refills | Status: DC

## 2020-11-19 MED ORDER — RIVAROXABAN 20 MG PO TABS: 20.0000 mg | ORAL_TABLET | Freq: Every day | ORAL | 1 refills | Status: DC

## 2020-11-19 MED ORDER — ROSUVASTATIN CALCIUM 40 MG PO TABS: 40.0000 mg | ORAL_TABLET | Freq: Every day | ORAL | 3 refills | Status: AC

## 2020-11-19 MED ORDER — HYDROCODONE-ACETAMINOPHEN 10-325 MG PO TABS
1.0000 | ORAL_TABLET | Freq: Three times a day (TID) | ORAL | 0 refills | Status: DC | PRN
Start: 2020-11-19 — End: 2021-02-18

## 2020-11-19 MED ORDER — ALBUTEROL SULFATE HFA 108 (90 BASE) MCG/ACT IN AERS: 1.0000 | INHALATION_SPRAY | Freq: Four times a day (QID) | RESPIRATORY_TRACT | 1 refills | Status: DC | PRN

## 2020-11-19 MED ORDER — LISINOPRIL 5 MG PO TABS: 5.0000 mg | ORAL_TABLET | Freq: Every day | ORAL | 0 refills | Status: DC

## 2020-11-19 NOTE — Assessment & Plan Note (Signed)
He is more short of breath today and oxygen level was decreased unclear if this could be an exacerbation of her heart failure and possibly volume overloaded again or if she has something else going on so will get labs and do a chest x-ray for further work-up.  I will probably have her increase her diuretic short-term to see if this is helpful.  She denies any increased swelling.

## 2020-11-19 NOTE — Telephone Encounter (Signed)
Please see note below I did call patient and left a message on her voicemail to come in for blood work in the morning and a chest x-ray and to go and take an extra Lasix this evening.  I did let her know that we would reach out again in the morning if we did not hear back from her so did encourage her to give [place] a call back so that we know she got the message so that we can get this done before the holiday weekend to determine if her increase shortness of breath and decreased pulse ox is related to her COPD or possibly heart failure again.  Have her take an extra tab of Lasix if she has not already done so.

## 2020-11-19 NOTE — Telephone Encounter (Signed)
Please call patient and let her know that we do need to get some up-to-date blood work I apologize for not giving her that lab slip while she was here it has been a year since we have also checked her cholesterol so she will need to fast for that lab work.  I am also going to change her metoprolol to another medication called carvedilol.  They are very similar, but the carvedilol can be more helpful with conditions like heart failure.  Are still can work on getting her back in with cardiology.

## 2020-11-19 NOTE — Progress Notes (Signed)
Established Patient Office Visit  Subjective:  Patient ID: Kelly Roth, female    DOB: September 09, 1950  Age: 70 y.o. MRN: [id number]  CC:  Chief Complaint  Patient presents with   Pain Management    HPI Kelly Roth presents for for chronic lumbar pain secondary to lumbar laminectomy with decompression and microdiscectomy with persistent pain.  Currently on piroxicam, Robaxin, Cymbalta, and hydrocodone.  Hypertension- Pt denies chest pain, SOB, dizziness, or heart palpitations.  Taking meds as directed w/o problems.  Denies medication side effects.  We stopped hctz at last OV bc of lasix.   C/o Left hip pain that is been really bothering her as well.  She says it initially started a little bit more posterior in the buttock area but now it is coming around the side and down into the top of her lateral thigh.  She is wondering if she might be at the point of needing hip replacements.  She is already had bilateral knee replacements over a decade ago.  It is painful with activity.  And some days it really limits her activity       Past Medical History:  Diagnosis Date   Adenomatous polyps 2004    Colonoscopy done in [place]    Back pain    Complication of anesthesia    COPD (chronic obstructive pulmonary disease) (HCC)    Dyspnea    pain contributes but also pt. reveals that she has been out of her Spiriva for one month +   GERD (gastroesophageal reflux disease)    Hemorrhoids    Hyperlipidemia    Hypertension    Osteoarthritis    PONV (postoperative nausea and vomiting)    scopolamine patch used with success with a prev. surg.     Past Surgical History:  Procedure Laterality Date   CATARACT EXTRACTION, BILATERAL Bilateral 04/2020   CHOLECYSTECTOMY  05/08/2007   JOINT REPLACEMENT Bilateral    left knee replacement  05/2005   right on 2-06   LUMBAR LAMINECTOMY/DECOMPRESSION MICRODISCECTOMY N/A 03/01/2016   Procedure: LEFT AND CENTRAL L3-4 AND  LEFT L4-5 DECOMPRESSIVE LUMBAR LAMINECTOMY FOR SPINAL STENOSIS;  Surgeon: Jessy Oto, MD;  Location: [facility];  Service: Orthopedics;  Laterality: N/A;   MULTIPLE TOOTH EXTRACTIONS      Family History  Problem Relation Age of Onset   Depression Mother    Hypertension Mother    Hyperlipidemia Mother    COPD Mother    Lung cancer Mother    Hyperlipidemia Father    Hypertension Father    Breast cancer Maternal Grandmother    Colon cancer Neg Hx        Neg     Social History   Socioeconomic History   Marital status: Married    Spouse name: Jones Delaporte   Number of children: 0   Years of education: 12   Highest education level: 12th grade  Occupational History   Occupation: Building control surveyor   Occupation: Retired.  Tobacco Use   Smoking status: Every Day    Packs/day: 0.50    Years: 49.00    Pack years: 24.50    Types: Cigarettes   Smokeless tobacco: Never  Vaping Use   Vaping Use: Never used  Substance and Sexual Activity   Alcohol use: Yes    Alcohol/week: 0.0 standard drinks    Comment: one drink a week    Drug use: No   Sexual activity: Not on file  Other Topics Concern  Not on file  Social History Narrative   Lives with her husband. She enjoys reading and adult coloring.    Social Determinants of Health   Financial Resource Strain: Low Risk    Difficulty of Paying Living Expenses: Not hard at all  Food Insecurity: No Food Insecurity   Worried About Charity fundraiser in the Last Year: Never true   [place] in the Last Year: Never true  Transportation Needs: No Transportation Needs   Lack of Transportation (Medical): No   Lack of Transportation (Non-Medical): No  Physical Activity: Inactive   Days of Exercise per Week: 0 days   Minutes of Exercise per Session: 0 min  Stress: No Stress Concern Present   Feeling of Stress : Not at all  Social Connections: Socially Isolated   Frequency of Communication with Friends and Family: Once a week   Frequency  of Social Gatherings with Friends and Family: Once a week   Attends Religious Services: Never   Marine scientist or Organizations: No   Attends Music therapist: Never   Marital Status: Married  Human resources officer Violence: Not At Risk   Fear of Current or Ex-Partner: No   Emotionally Abused: No   Physically Abused: No   Sexually Abused: No    Outpatient Medications Prior to Visit  Medication Sig Dispense Refill   Multiple Vitamin (MULTIVITAMIN) capsule Take 1 capsule by mouth daily.       albuterol (VENTOLIN HFA) 108 (90 Base) MCG/ACT inhaler TAKE 2 PUFFS BY MOUTH EVERY 6 HOURS AS NEEDED FOR WHEEZE OR SHORTNESS OF BREATH 8.5 each 3   AMBULATORY NON FORMULARY MEDICATION Medication Name:2 liter oxygen with supplies including portable oxygen. Dx hypoxia.  88% at rest.  See OV note 07/23/20. Please fax to [facility] (Patient not taking: No sig reported) 1 Units 0   DULoxetine (CYMBALTA) 60 MG capsule TAKE 1 CAPSULE BY MOUTH EVERY DAY 90 capsule 1   furosemide (LASIX) 40 MG tablet TAKE 1 TABLET BY MOUTH TWICE A DAY 180 tablet 1   HYDROcodone-acetaminophen (NORCO) 10-325 MG tablet Take 1 tablet by mouth every 8 (eight) hours as needed. (Patient not taking: Reported on 10/08/2020) 90 tablet 0   HYDROcodone-acetaminophen (NORCO) 10-325 MG tablet Take 1 tablet by mouth every 8 (eight) hours as needed. 90 tablet 0   HYDROcodone-acetaminophen (NORCO) 10-325 MG tablet Take 1 tablet by mouth every 8 (eight) hours as needed. 90 tablet 0   lisinopril (ZESTRIL) 10 MG tablet TAKE 1 TABLET BY MOUTH EVERY DAY (Patient not taking: Reported on 10/08/2020) 90 tablet 0   lisinopril-hydrochlorothiazide (ZESTORETIC) 20-12.5 MG tablet Take 1 tablet by mouth daily.     metoprolol succinate (TOPROL-XL) 50 MG 24 hr tablet TAKE 1 TABLET (50 MG TOTAL) BY MOUTH DAILY. TAKE WITH OR IMMEDIATELY FOLLOWING A MEAL. 90 tablet 1   rivaroxaban (XARELTO) 20 MG TABS tablet Take 20 mg by mouth daily.     rosuvastatin  (CRESTOR) 40 MG tablet TAKE 1 TABLET BY MOUTH EVERY DAY 90 tablet 3   umeclidinium-vilanterol (ANORO ELLIPTA) 62.5-25 MCG/INH AEPB INHALE 1 PUFF BY MOUTH EVERY DAY 60 each 6   No facility-administered medications prior to visit.    Allergies  Allergen Reactions   Nsaids Other (See Comments)    ROS Review of Systems    Objective:    Physical Exam Constitutional:      Appearance: Normal appearance. She is well-developed.  HENT:     Head: Normocephalic  and atraumatic.  Cardiovascular:     Rate and Rhythm: Normal rate and regular rhythm.     Heart sounds: Normal heart sounds.  Pulmonary:     Breath sounds: Normal breath sounds.     Comments: Short of breath while speaking today.  No increased work of breathing but she would have to pause after her sentences to slowly take a deep deep breath. Skin:    General: Skin is warm and dry.  Neurological:     Mental Status: She is alert and oriented to person, place, and time.  Psychiatric:        Behavior: Behavior normal.    BP 108/68   Pulse 74   Ht '5\' 3"'$  (1.6 m)   Wt 222 lb 1.9 oz (100.8 kg)   SpO2 (!) 76%   BMI 39.35 kg/m  Wt Readings from Last 3 Encounters:  11/19/20 222 lb 1.9 oz (100.8 kg)  08/18/20 221 lb (100.2 kg)  07/23/20 231 lb (104.8 kg)     Health Maintenance Due  Topic Date Due   COVID-19 Vaccine (4 - Booster for Pfizer series) 04/06/2020    There are no preventive care reminders to display for this patient.  Lab Results  Component Value Date   TSH 1.75 08/22/2018   Lab Results  Component Value Date   WBC 9.1 07/20/2020   HGB 13.1 07/20/2020   HCT 42.8 07/20/2020   MCV 94.9 07/20/2020   PLT 127 (L) 07/20/2020   Lab Results  Component Value Date   NA 139 08/18/2020   K 3.5 08/18/2020   CO2 37 (H) 08/18/2020   GLUCOSE 139 (H) 08/18/2020   BUN 19 08/18/2020   CREATININE 0.88 08/18/2020   BILITOT 0.6 11/21/2019   ALKPHOS 95 08/22/2016   AST 18 11/21/2019   ALT 57 (H) 11/21/2019   PROT  6.4 11/21/2019   ALBUMIN 4.3 08/22/2016   CALCIUM 10.9 (H) 08/18/2020   ANIONGAP 7 03/02/2016   Lab Results  Component Value Date   CHOL 137 11/21/2019   Lab Results  Component Value Date   HDL 47 (L) 11/21/2019   Lab Results  Component Value Date   LDLCALC 72 11/21/2019   Lab Results  Component Value Date   TRIG 99 11/21/2019   Lab Results  Component Value Date   CHOLHDL 2.9 11/21/2019   Lab Results  Component Value Date   HGBA1C 5.9 (H) 08/18/2020      Assessment & Plan:   Problem List Items Addressed This Visit       Cardiovascular and Mediastinum   Typical atrial flutter (HCC)   Relevant Medications   furosemide (LASIX) 40 MG tablet   rosuvastatin (CRESTOR) 40 MG tablet   rivaroxaban (XARELTO) 20 MG TABS tablet   lisinopril (ZESTRIL) 5 MG tablet   carvedilol (COREG) 3.125 MG tablet   HYPERTENSION, BENIGN    Pressure is still a little bit low.  We did stop the hydrochlorothiazide because she was now taking Lasix daily to control her swelling.      Relevant Medications   furosemide (LASIX) 40 MG tablet   rosuvastatin (CRESTOR) 40 MG tablet   rivaroxaban (XARELTO) 20 MG TABS tablet   lisinopril (ZESTRIL) 5 MG tablet   carvedilol (COREG) 3.125 MG tablet   Other Relevant Orders   DG Hip Unilat W OR W/O Pelvis 2-3 Views Left   COMPLETE METABOLIC PANEL WITH GFR   Lipid Panel w/reflex Direct LDL   B Nat Peptide   Atrial  fibrillation ([place])    Stable on Xarelto.  Refill sent to pharmacy.      Relevant Medications   furosemide (LASIX) 40 MG tablet   rosuvastatin (CRESTOR) 40 MG tablet   rivaroxaban (XARELTO) 20 MG TABS tablet   lisinopril (ZESTRIL) 5 MG tablet   carvedilol (COREG) 3.125 MG tablet   Acute heart failure (HCC)    He is more short of breath today and oxygen level was decreased unclear if this could be an exacerbation of her heart failure and possibly volume overloaded again or if she has something else going on so will get labs and do a  chest x-ray for further work-up.  I will probably have her increase her diuretic short-term to see if this is helpful.  She denies any increased swelling.      Relevant Medications   furosemide (LASIX) 40 MG tablet   rosuvastatin (CRESTOR) 40 MG tablet   rivaroxaban (XARELTO) 20 MG TABS tablet   lisinopril (ZESTRIL) 5 MG tablet   carvedilol (COREG) 3.125 MG tablet   Other Relevant Orders   Ambulatory referral to Cardiology   COMPLETE METABOLIC PANEL WITH GFR   Lipid Panel w/reflex Direct LDL   B Nat Peptide     Respiratory   Chronic obstructive pulmonary disease (HCC)    Unclear if increased shortness of breath is related to her COPD though she has not had an increase or change in cough.  She is a chronic smoker.      Relevant Medications   albuterol (VENTOLIN HFA) 108 (90 Base) MCG/ACT inhaler   umeclidinium-vilanterol (ANORO ELLIPTA) 62.5-25 MCG/INH AEPB   Other Relevant Orders   DG Chest 2 View     Other   Encounter for chronic pain management    Hydrocodone 7.5/325 1 po TID #90/mo History of lumbar laminectomy with decompression and microdiscectomy December 2017. Additional medications: Piroxicam, Robaxin, Cymbalta Last UDS date: 11/19/2020 - monitor for alcohol  Opioid Treatment Agreement signed (Y/N): 02/18/2020 Opioid Treatment Agreement last reviewed with patient:  02/18/2020 NCCSRS reviewed this encounter (include red flags): Yes      Relevant Medications   HYDROcodone-acetaminophen (NORCO) 10-325 MG tablet (Start on 01/17/2021)   HYDROcodone-acetaminophen (NORCO) 10-325 MG tablet (Start on 12/18/2020)   HYDROcodone-acetaminophen (NORCO) 10-325 MG tablet   DULoxetine (CYMBALTA) 60 MG capsule   Other Relevant Orders   DRUG MONITORING, PANEL 6 WITH CONFIRMATION, URINE   Other Visit Diagnoses     Need for immunization against influenza    -  Primary   Relevant Orders   Flu Vaccine QUAD High Dose(Fluad) (Completed)   Shortness of breath       Relevant Medications    albuterol (VENTOLIN HFA) 108 (90 Base) MCG/ACT inhaler   Other Relevant Orders   DG Chest 2 View   Left hip pain           Meds ordered this encounter  Medications   HYDROcodone-acetaminophen (NORCO) 10-325 MG tablet    Sig: Take 1 tablet by mouth every 8 (eight) hours as needed.    Dispense:  90 tablet    Refill:  0   HYDROcodone-acetaminophen (NORCO) 10-325 MG tablet    Sig: Take 1 tablet by mouth every 8 (eight) hours as needed.    Dispense:  90 tablet    Refill:  0   HYDROcodone-acetaminophen (NORCO) 10-325 MG tablet    Sig: Take 1 tablet by mouth every 8 (eight) hours as needed.    Dispense:  90 tablet  Refill:  0   albuterol (VENTOLIN HFA) 108 (90 Base) MCG/ACT inhaler    Sig: Inhale 1-2 puffs into the lungs every 6 (six) hours as needed for wheezing or shortness of breath.    Dispense:  18 each    Refill:  1   DULoxetine (CYMBALTA) 60 MG capsule    Sig: TAKE 1 CAPSULE BY MOUTH EVERY DAY    Dispense:  90 capsule    Refill:  1   furosemide (LASIX) 40 MG tablet    Sig: Take 1 tablet (40 mg total) by mouth 2 (two) times daily.    Dispense:  180 tablet    Refill:  1   DISCONTD: metoprolol succinate (TOPROL-XL) 50 MG 24 hr tablet    Sig: Take 1 tablet (50 mg total) by mouth daily. Take with or immediately following a meal.    Dispense:  90 tablet    Refill:  1   rosuvastatin (CRESTOR) 40 MG tablet    Sig: Take 1 tablet (40 mg total) by mouth at bedtime.    Dispense:  90 tablet    Refill:  3   umeclidinium-vilanterol (ANORO ELLIPTA) 62.5-25 MCG/INH AEPB    Sig: INHALE 1 PUFF BY MOUTH EVERY DAY    Dispense:  60 each    Refill:  6   rivaroxaban (XARELTO) 20 MG TABS tablet    Sig: Take 1 tablet (20 mg total) by mouth daily.    Dispense:  90 tablet    Refill:  1   lisinopril (ZESTRIL) 5 MG tablet    Sig: Take 1 tablet (5 mg total) by mouth daily.    Dispense:  90 tablet    Refill:  0   carvedilol (COREG) 3.125 MG tablet    Sig: Take 1 tablet (3.125 mg total) by  mouth 2 (two) times daily with a meal.    Dispense:  60 tablet    Refill:  2    Please discontinue metoprolol prescription sent earlier today and please fill carvedilol in its place.    Follow-up: Return in about 3 months (around 02/18/2021) for pain management.    Beatrice Lecher, MD

## 2020-11-19 NOTE — Assessment & Plan Note (Signed)
Stable on Xarelto.  Refill sent to pharmacy.

## 2020-11-19 NOTE — Assessment & Plan Note (Signed)
Unclear if increased shortness of breath is related to her COPD though she has not had an increase or change in cough.  She is a chronic smoker.

## 2020-11-19 NOTE — Assessment & Plan Note (Signed)
Pressure is still a little bit low.  We did stop the hydrochlorothiazide because she was now taking Lasix daily to control her swelling.

## 2020-11-19 NOTE — Assessment & Plan Note (Signed)
Hydrocodone 7.5/325 1 po TID #90/mo History of lumbar laminectomy with decompression and microdiscectomy December 2017. Additional medications: Piroxicam, Robaxin, Cymbalta Last UDS date: 11/19/2020 - monitor for alcohol Opioid Treatment Agreement signed (Y/N):02/18/2020 Opioid Treatment Agreement last reviewed with patient: 02/18/2020 NCCSRS reviewed this encounter (include red flags):Yes

## 2020-11-20 NOTE — Telephone Encounter (Signed)
Called pt to see if she got the vm from yesterday after her OV. She stated that she did not.   I advised her that Dr. Madilyn Fireman would like for her to come in this morning to have a CXR,labs done,and also take an extra Lasix.   I asked her to check her O2 she said that her pulse ox was in her suitcase and she didn't want to wake her husband.  Asked that when she comes in to stop by the office and ask for me so that I can check this while she is her. She agreed to this

## 2020-11-22 LAB — DRUG MONITORING, PANEL 6 WITH CONFIRMATION, URINE
6 Acetylmorphine: NEGATIVE ng/mL (ref <10)
Alcohol Metabolites: POSITIVE ng/mL — AB (ref <500)
Amphetamines: NEGATIVE ng/mL (ref <500)
Barbiturates: NEGATIVE ng/mL (ref <300)
Benzodiazepines: NEGATIVE ng/mL (ref <100)
Cocaine Metabolite: NEGATIVE ng/mL (ref <150)
Codeine: NEGATIVE ng/mL (ref <50)
Creatinine: 192.2 mg/dL (ref >=20.0)
Ethyl Glucuronide (ETG): 5385 ng/mL — ABNORMAL HIGH (ref <500)
Ethyl Sulfate (ETS): 452 ng/mL — ABNORMAL HIGH (ref <100)
Hydrocodone: 8695 ng/mL — ABNORMAL HIGH (ref <50)
Hydromorphone: 787 ng/mL — ABNORMAL HIGH (ref <50)
Marijuana Metabolite: 27 ng/mL — ABNORMAL HIGH (ref <5)
Marijuana Metabolite: POSITIVE ng/mL — AB (ref <20)
Methadone Metabolite: NEGATIVE ng/mL (ref <100)
Morphine: NEGATIVE ng/mL (ref <50)
Norhydrocodone: >10000 ng/mL — ABNORMAL HIGH (ref <50)
Opiates: POSITIVE ng/mL — AB (ref <100)
Oxidant: NEGATIVE ug/mL (ref <200)
Oxycodone: NEGATIVE ng/mL (ref <100)
Phencyclidine: NEGATIVE ng/mL (ref <25)
pH: 7.7 (ref 4.5–9.0)

## 2020-11-22 LAB — DM TEMPLATE

## 2020-11-26 DIAGNOSIS — Z23 Encounter for immunization: Secondary | ICD-10-CM | POA: Diagnosis not present

## 2020-11-27 NOTE — Progress Notes (Signed)
Hi Kelly Roth,  Your urine drug screen was positive for alcohol and marijuana.  Please understand that it is extremely unsafe for you to mix either of these with chronic narcotics.  Especially on top of your respiratory and heart problems this is dangerous.  I know we have discussed this before.  Please understand that for your safety if you continue to drink alcohol we will have to taper off your medications or we will have to transfer you to pain management.  If you are struggling with being able to give up alcohol I am more than happy to provide resources.  There is a a program available through [facility] [place] and also through [facility].  They have inpatient and outpatient programs that can be very helpful.

## 2020-12-04 ENCOUNTER — Other Ambulatory Visit: Payer: Self-pay

## 2020-12-04 ENCOUNTER — Ambulatory Visit (INDEPENDENT_AMBULATORY_CARE_PROVIDER_SITE_OTHER): Payer: Medicare Other

## 2020-12-04 DIAGNOSIS — M25552 Pain in left hip: Secondary | ICD-10-CM

## 2020-12-04 DIAGNOSIS — R0602 Shortness of breath: Secondary | ICD-10-CM

## 2020-12-04 DIAGNOSIS — I10 Essential (primary) hypertension: Secondary | ICD-10-CM

## 2020-12-04 DIAGNOSIS — J449 Chronic obstructive pulmonary disease, unspecified: Secondary | ICD-10-CM | POA: Diagnosis not present

## 2020-12-04 DIAGNOSIS — I509 Heart failure, unspecified: Secondary | ICD-10-CM | POA: Diagnosis not present

## 2020-12-05 LAB — COMPLETE METABOLIC PANEL WITH GFR
AG Ratio: 1.5 (calc) (ref 1.0–2.5)
ALT: 22 U/L (ref 6–29)
AST: 15 U/L (ref 10–35)
Albumin: 3.7 g/dL (ref 3.6–5.1)
Alkaline phosphatase (APISO): 81 U/L (ref 37–153)
BUN: 15 mg/dL (ref 7–25)
CO2: 32 mmol/L (ref 20–32)
Calcium: 10.2 mg/dL (ref 8.6–10.4)
Chloride: 98 mmol/L (ref 98–110)
Creat: 0.74 mg/dL (ref 0.50–1.05)
Globulin: 2.5 g/dL (calc) (ref 1.9–3.7)
Glucose, Bld: 119 mg/dL — ABNORMAL HIGH (ref 65–99)
Potassium: 4.1 mmol/L (ref 3.5–5.3)
Sodium: 137 mmol/L (ref 135–146)
Total Bilirubin: 1.9 mg/dL — ABNORMAL HIGH (ref 0.2–1.2)
Total Protein: 6.2 g/dL (ref 6.1–8.1)
eGFR: 89 mL/min/{1.73_m2} (ref >=60)

## 2020-12-05 LAB — LIPID PANEL W/REFLEX DIRECT LDL
Cholesterol: 98 mg/dL (ref <200)
HDL: 41 mg/dL — ABNORMAL LOW (ref >=50)
LDL Cholesterol (Calc): 41 mg/dL (calc)
Non-HDL Cholesterol (Calc): 57 mg/dL (calc) (ref <130)
Total CHOL/HDL Ratio: 2.4 (calc) (ref <5.0)
Triglycerides: 82 mg/dL (ref <150)

## 2020-12-05 LAB — BRAIN NATRIURETIC PEPTIDE: Brain Natriuretic Peptide: 2539 pg/mL — ABNORMAL HIGH (ref <100)

## 2020-12-07 NOTE — Progress Notes (Signed)
Hi Brantley, the great news is is that your chloride and calcium are better.  Your total cholesterol and LDL look great.  Your BNP is extremely high again indicative of potential for heart failure.  Make sure you are taking the Lasix/furosemide every day to pull off extra fluid.  Would strongly encourage you to cut back off the alcohol use as well.  Has cardiology called to get you in for an appointment?

## 2020-12-07 NOTE — Progress Notes (Signed)
Hi Kelly Roth, no evidence of fracture or dislocation of that hip.  I would like to get you in with one of our orthopedist or sports med docs for the hip.  Please let me know if you have a preference.

## 2020-12-07 NOTE — Progress Notes (Signed)
Hi [name], chest x-ray looks okay except it does show enlarged heart.  Know we had placed a cardiology referral at the beginning of September I just want make sure that you were contacted and that you have an upcoming appointment.

## 2020-12-18 ENCOUNTER — Other Ambulatory Visit: Payer: Self-pay | Admitting: Family Medicine

## 2020-12-18 DIAGNOSIS — I10 Essential (primary) hypertension: Secondary | ICD-10-CM

## 2020-12-18 DIAGNOSIS — I509 Heart failure, unspecified: Secondary | ICD-10-CM

## 2021-01-17 ENCOUNTER — Other Ambulatory Visit: Payer: Self-pay | Admitting: Family Medicine

## 2021-01-17 DIAGNOSIS — I509 Heart failure, unspecified: Secondary | ICD-10-CM

## 2021-01-17 DIAGNOSIS — I10 Essential (primary) hypertension: Secondary | ICD-10-CM

## 2021-02-18 ENCOUNTER — Telehealth: Payer: Self-pay | Admitting: Family Medicine

## 2021-02-18 ENCOUNTER — Encounter: Payer: Self-pay | Admitting: Family Medicine

## 2021-02-18 ENCOUNTER — Ambulatory Visit (INDEPENDENT_AMBULATORY_CARE_PROVIDER_SITE_OTHER): Payer: Medicare Other | Admitting: Family Medicine

## 2021-02-18 ENCOUNTER — Other Ambulatory Visit: Payer: Self-pay

## 2021-02-18 VITALS — BP 117/80 | HR 109 | Temp 97.7°F | Resp 18 | Wt 219.0 lb

## 2021-02-18 DIAGNOSIS — I509 Heart failure, unspecified: Secondary | ICD-10-CM | POA: Diagnosis not present

## 2021-02-18 DIAGNOSIS — R7301 Impaired fasting glucose: Secondary | ICD-10-CM

## 2021-02-18 DIAGNOSIS — I10 Essential (primary) hypertension: Secondary | ICD-10-CM

## 2021-02-18 DIAGNOSIS — G8929 Other chronic pain: Secondary | ICD-10-CM | POA: Diagnosis not present

## 2021-02-18 DIAGNOSIS — J449 Chronic obstructive pulmonary disease, unspecified: Secondary | ICD-10-CM | POA: Diagnosis not present

## 2021-02-18 LAB — POCT GLYCOSYLATED HEMOGLOBIN (HGB A1C): Hemoglobin A1C: 5.8 % — AB (ref 4.0–5.6)

## 2021-02-18 MED ORDER — HYDROCODONE-ACETAMINOPHEN 10-325 MG PO TABS: 1.0000 | ORAL_TABLET | Freq: Three times a day (TID) | ORAL | 0 refills | Status: DC | PRN

## 2021-02-18 MED ORDER — TRELEGY ELLIPTA 100-62.5-25 MCG/ACT IN AEPB
1.0000 | INHALATION_SPRAY | Freq: Every day | RESPIRATORY_TRACT | 3 refills | Status: AC
Start: 2021-02-18 — End: ?

## 2021-02-18 NOTE — Progress Notes (Signed)
Patient stated she sees [facility] and will call them to schedule her repeat Colonoscopy.

## 2021-02-18 NOTE — Assessment & Plan Note (Addendum)
Hydrocodone 7.5/325 1 po TID #90/mo History of lumbar laminectomy with decompression and microdiscectomy December 2017. Additional medications: Piroxicam, Robaxin, Cymbalta Last UDS date: 11/19/2020 - monitor for alcohol Opioid Treatment Agreement signed (Y/N):05/20/2020 Opioid Treatment Agreement last reviewed with patient: 05/20/2020 NCCSRS reviewed this encounter (include red flags):Yes

## 2021-02-18 NOTE — Progress Notes (Signed)
Established Patient Office Visit  Subjective:  Patient ID: Kelly Roth, female    DOB: [birth year]  Age: 70 y.o. MRN: [id number]  CC:  Chief Complaint  Patient presents with   Follow-up    a1c   Shortness of Breath    2 months. Using the Albuterol inhaler more often    Pain Management    HPI Kelly Roth presents for   Chronic pain management-  Follow-up COPD-she says she uses her Norco every day but she is still grabbing her albuterol 3-4 times a day.  Still continues to smoke.  Past Medical History:  Diagnosis Date   Adenomatous polyps 2004    Colonoscopy done in [place]    Back pain    Complication of anesthesia    COPD (chronic obstructive pulmonary disease) (HCC)    Dyspnea    pain contributes but also pt. reveals that she has been out of her Spiriva for one month +   GERD (gastroesophageal reflux disease)    Hemorrhoids    Hyperlipidemia    Hypertension    Osteoarthritis    PONV (postoperative nausea and vomiting)    scopolamine patch used with success with a prev. surg.     Past Surgical History:  Procedure Laterality Date   CATARACT EXTRACTION, BILATERAL Bilateral 04/2020   CHOLECYSTECTOMY  05/08/2007   JOINT REPLACEMENT Bilateral    left knee replacement  05/2005   right on 2-06   LUMBAR LAMINECTOMY/DECOMPRESSION MICRODISCECTOMY N/A 03/01/2016   Procedure: LEFT AND CENTRAL L3-4 AND LEFT L4-5 DECOMPRESSIVE LUMBAR LAMINECTOMY FOR SPINAL STENOSIS;  Surgeon: Jessy Oto, MD;  Location: [place];  Service: Orthopedics;  Laterality: N/A;   MULTIPLE TOOTH EXTRACTIONS      Family History  Problem Relation Age of Onset   Depression Mother    Hypertension Mother    Hyperlipidemia Mother    COPD Mother    Lung cancer Mother    Hyperlipidemia Father    Hypertension Father    Breast cancer Maternal Grandmother    Colon cancer Neg Hx        Neg     Social History   Socioeconomic History   Marital status: Married     Spouse name: Deaundra Dupriest   Number of children: 0   Years of education: 12   Highest education level: 12th grade  Occupational History   Occupation: Building control surveyor   Occupation: Retired.  Tobacco Use   Smoking status: Every Day    Packs/day: 0.50    Years: 49.00    Pack years: 24.50    Types: Cigarettes   Smokeless tobacco: Never   Tobacco comments:    1/2 pack a day   Vaping Use   Vaping Use: Never used  Substance and Sexual Activity   Alcohol use: Yes    Alcohol/week: 0.0 standard drinks    Comment: one drink a week    Drug use: No   Sexual activity: Not on file  Other Topics Concern   Not on file  Social History Narrative   Lives with her husband. She enjoys reading and adult coloring.    Social Determinants of Health   Financial Resource Strain: Low Risk    Difficulty of Paying Living Expenses: Not hard at all  Food Insecurity: No Food Insecurity   Worried About Charity fundraiser in the Last Year: Never true   Ran Out of Food in the Last Year: Never true  Transportation Needs:  No Transportation Needs   Lack of Transportation (Medical): No   Lack of Transportation (Non-Medical): No  Physical Activity: Inactive   Days of Exercise per Week: 0 days   Minutes of Exercise per Session: 0 min  Stress: No Stress Concern Present   Feeling of Stress : Not at all  Social Connections: Socially Isolated   Frequency of Communication with Friends and Family: Once a week   Frequency of Social Gatherings with Friends and Family: Once a week   Attends Religious Services: Never   Marine scientist or Organizations: No   Attends Music therapist: Never   Marital Status: Married  Human resources officer Violence: Not At Risk   Fear of Current or Ex-Partner: No   Emotionally Abused: No   Physically Abused: No   Sexually Abused: No    Outpatient Medications Prior to Visit  Medication Sig Dispense Refill   albuterol (VENTOLIN HFA) 108 (90 Base) MCG/ACT inhaler Inhale  1-2 puffs into the lungs every 6 (six) hours as needed for wheezing or shortness of breath. 18 each 1   carvedilol (COREG) 3.125 MG tablet TAKE 1 TABLET TWICE A DAY WITH FOOD 180 tablet 0   DULoxetine (CYMBALTA) 60 MG capsule TAKE 1 CAPSULE BY MOUTH EVERY DAY 90 capsule 1   furosemide (LASIX) 40 MG tablet Take 1 tablet (40 mg total) by mouth 2 (two) times daily. 180 tablet 1   lisinopril (ZESTRIL) 5 MG tablet TAKE 1 TABLET (5 MG TOTAL) BY MOUTH DAILY. 90 tablet 0   metoprolol succinate (TOPROL-XL) 50 MG 24 hr tablet Take 50 mg by mouth daily. Take with or immediately following a meal.     Multiple Vitamin (MULTIVITAMIN) capsule Take 1 capsule by mouth daily.       rivaroxaban (XARELTO) 20 MG TABS tablet Take 1 tablet (20 mg total) by mouth daily. 90 tablet 1   rosuvastatin (CRESTOR) 40 MG tablet Take 1 tablet (40 mg total) by mouth at bedtime. 90 tablet 3   HYDROcodone-acetaminophen (NORCO) 10-325 MG tablet Take 1 tablet by mouth every 8 (eight) hours as needed. 90 tablet 0   HYDROcodone-acetaminophen (NORCO) 10-325 MG tablet Take 1 tablet by mouth every 8 (eight) hours as needed. 90 tablet 0   HYDROcodone-acetaminophen (NORCO) 10-325 MG tablet Take 1 tablet by mouth every 8 (eight) hours as needed. 90 tablet 0   umeclidinium-vilanterol (ANORO ELLIPTA) 62.5-25 MCG/INH AEPB INHALE 1 PUFF BY MOUTH EVERY DAY 60 each 6   No facility-administered medications prior to visit.    Allergies  Allergen Reactions   Nsaids Other (See Comments)    ROS Review of Systems    Objective:    Physical Exam Constitutional:      Appearance: Normal appearance. She is well-developed.  HENT:     Head: Normocephalic and atraumatic.  Cardiovascular:     Rate and Rhythm: Normal rate and regular rhythm.     Heart sounds: Normal heart sounds.  Pulmonary:     Effort: Pulmonary effort is normal.     Comments: Decreased diffuse BS Skin:    General: Skin is warm and dry.  Neurological:     Mental Status: She  is alert and oriented to person, place, and time.  Psychiatric:        Behavior: Behavior normal.    BP 117/80   Pulse (!) 109   Temp 97.7 F (36.5 C)   Resp 18   Wt 219 lb (99.3 kg)   SpO2 96%  BMI 38.79 kg/m  Wt Readings from Last 3 Encounters:  02/18/21 219 lb (99.3 kg)  11/19/20 222 lb 1.9 oz (100.8 kg)  08/18/20 221 lb (100.2 kg)     Health Maintenance Due  Topic Date Due   Pneumonia Vaccine 61+ Years old (3 - PCV) 05/14/2019   COVID-19 Vaccine (5 - Booster for Pfizer series) 01/18/2021    There are no preventive care reminders to display for this patient.  Lab Results  Component Value Date   TSH 1.75 08/22/2018   Lab Results  Component Value Date   WBC 9.1 07/20/2020   HGB 13.1 07/20/2020   HCT 42.8 07/20/2020   MCV 94.9 07/20/2020   PLT 127 (L) 07/20/2020   Lab Results  Component Value Date   NA 137 12/04/2020   K 4.1 12/04/2020   CO2 32 12/04/2020   GLUCOSE 119 (H) 12/04/2020   BUN 15 12/04/2020   CREATININE 0.74 12/04/2020   BILITOT 1.9 (H) 12/04/2020   ALKPHOS 95 08/22/2016   AST 15 12/04/2020   ALT 22 12/04/2020   PROT 6.2 12/04/2020   ALBUMIN 4.3 08/22/2016   CALCIUM 10.2 12/04/2020   ANIONGAP 7 03/02/2016   EGFR 89 12/04/2020   Lab Results  Component Value Date   CHOL 98 12/04/2020   Lab Results  Component Value Date   HDL 41 (L) 12/04/2020   Lab Results  Component Value Date   LDLCALC 41 12/04/2020   Lab Results  Component Value Date   TRIG 82 12/04/2020   Lab Results  Component Value Date   CHOLHDL 2.4 12/04/2020   Lab Results  Component Value Date   HGBA1C 5.9 (H) 08/18/2020      Assessment & Plan:   Problem List Items Addressed This Visit       Cardiovascular and Mediastinum   HYPERTENSION, BENIGN    Repeat blood pressure looks good today.  Continue current regimen.  Because she has been using her Lasix regularly working check a BMP.      Relevant Medications   metoprolol succinate (TOPROL-XL) 50 MG 24  hr tablet   Other Relevant Orders   BASIC METABOLIC PANEL WITH GFR   Acute heart failure ([place])    Fortunately she has not heard back from cardiology so we will check on the referral which was placed in September to see if we can get her in as soon as possible.  She does feel like the volume overload issue has improved but she is still significantly short of breath.  Though some of this could be from her underlying COPD.  No recent chest pain.      Relevant Medications   metoprolol succinate (TOPROL-XL) 50 MG 24 hr tablet     Respiratory   Chronic obstructive pulmonary disease (HCC)    Discussed options.  Will change Anoro to Trelegy since she is stiill using her albuterol about 3-4 times a day.  She continues to smoke.      Relevant Medications   Fluticasone-Umeclidin-Vilant (TRELEGY ELLIPTA) 100-62.5-25 MCG/ACT AEPB     Endocrine   IFG (impaired fasting glucose)   Relevant Orders   POCT glycosylated hemoglobin (Hb X5Q)   BASIC METABOLIC PANEL WITH GFR     Other   Encounter for chronic pain management - Primary    Hydrocodone 7.5/325 1 po TID #90/mo History of lumbar laminectomy with decompression and microdiscectomy December 2017. Additional medications: Piroxicam, Robaxin, Cymbalta Last UDS date: 11/19/2020 - monitor for alcohol  Opioid Treatment Agreement  signed (Y/N): 05/20/2020 Opioid Treatment Agreement last reviewed with patient:  05/20/2020 [facility] reviewed this encounter (include red flags): Yes      Relevant Medications   HYDROcodone-acetaminophen (NORCO) 10-325 MG tablet (Start on 04/19/2021)   HYDROcodone-acetaminophen (NORCO) 10-325 MG tablet (Start on 03/20/2021)   HYDROcodone-acetaminophen (NORCO) 10-325 MG tablet   Other Relevant Orders   POCT glycosylated hemoglobin (Hb A1C)    Meds ordered this encounter  Medications   HYDROcodone-acetaminophen (NORCO) 10-325 MG tablet    Sig: Take 1 tablet by mouth every 8 (eight) hours as needed.    Dispense:  90 tablet     Refill:  0   HYDROcodone-acetaminophen (NORCO) 10-325 MG tablet    Sig: Take 1 tablet by mouth every 8 (eight) hours as needed.    Dispense:  90 tablet    Refill:  0   HYDROcodone-acetaminophen (NORCO) 10-325 MG tablet    Sig: Take 1 tablet by mouth every 8 (eight) hours as needed.    Dispense:  90 tablet    Refill:  0   Fluticasone-Umeclidin-Vilant (TRELEGY ELLIPTA) 100-62.5-25 MCG/ACT AEPB    Sig: Inhale 1 puff into the lungs daily.    Dispense:  3 each    Refill:  3     Follow-up: Return in about 3 months (around 05/19/2021) for Pain Management .    Beatrice Lecher, MD

## 2021-02-18 NOTE — Telephone Encounter (Addendum)
Hi Kelly Roth if you can do me a favor and call at [facility] cardiology we have placed a referral to them in September think [name] work that referral.  And just see if they had tried to contact the patient or not.  She is bad about not answering her phone and checking her messages so they may have tried to contact her but I just want to make sure.

## 2021-02-18 NOTE — Telephone Encounter (Incomplete Revision)
Hi Kelly Roth if you can do me a favor and call at [facility] cardiology we have placed a referral to them in September think Kelly Roth work that referral.  And just see if they had tried to contact the patient or not.  She is bad about not answering her phone and checking her messages so they may have tried to contact her but I just want to make sure.

## 2021-02-18 NOTE — Assessment & Plan Note (Signed)
Discussed options.  Will change Anoro to Trelegy since she is stiill using her albuterol about 3-4 times a day.  She continues to smoke.

## 2021-02-18 NOTE — Assessment & Plan Note (Signed)
Fortunately she has not heard back from cardiology so we will check on the referral which was placed in September to see if we can get her in as soon as possible.  She does feel like the volume overload issue has improved but she is still significantly short of breath.  Though some of this could be from her underlying COPD.  No recent chest pain.

## 2021-02-18 NOTE — Assessment & Plan Note (Signed)
Repeat blood pressure looks good today.  Continue current regimen.  Because she has been using her Lasix regularly working check a BMP.

## 2021-02-19 LAB — BASIC METABOLIC PANEL WITH GFR
BUN: 14 mg/dL (ref 7–25)
CO2: 34 mmol/L — ABNORMAL HIGH (ref 20–32)
Calcium: 10.2 mg/dL (ref 8.6–10.4)
Chloride: 97 mmol/L — ABNORMAL LOW (ref 98–110)
Creat: 0.76 mg/dL (ref 0.50–1.05)
Glucose, Bld: 122 mg/dL — ABNORMAL HIGH (ref 65–99)
Potassium: 3.7 mmol/L (ref 3.5–5.3)
Sodium: 141 mmol/L (ref 135–146)
eGFR: 86 mL/min/{1.73_m2} (ref >=60)

## 2021-02-19 NOTE — Progress Notes (Signed)
Your lab work is within acceptable range and there are no concerning findings.   ?

## 2021-02-27 DIAGNOSIS — Z20822 Contact with and (suspected) exposure to covid-19: Secondary | ICD-10-CM | POA: Diagnosis not present

## 2021-03-04 DIAGNOSIS — I483 Typical atrial flutter: Secondary | ICD-10-CM | POA: Diagnosis not present

## 2021-03-04 DIAGNOSIS — I10 Essential (primary) hypertension: Secondary | ICD-10-CM | POA: Diagnosis not present

## 2021-03-22 ENCOUNTER — Telehealth: Payer: Self-pay | Admitting: *Deleted

## 2021-03-22 ENCOUNTER — Other Ambulatory Visit: Payer: Self-pay | Admitting: *Deleted

## 2021-03-22 DIAGNOSIS — Z87891 Personal history of nicotine dependence: Secondary | ICD-10-CM

## 2021-03-22 DIAGNOSIS — G8929 Other chronic pain: Secondary | ICD-10-CM

## 2021-03-22 DIAGNOSIS — F17210 Nicotine dependence, cigarettes, uncomplicated: Secondary | ICD-10-CM

## 2021-03-22 MED ORDER — HYDROCODONE-ACETAMINOPHEN 10-325 MG PO TABS: 1.0000 | ORAL_TABLET | Freq: Three times a day (TID) | ORAL | 0 refills | Status: DC | PRN

## 2021-03-22 NOTE — Telephone Encounter (Signed)
Meds ordered this encounter  Medications   HYDROcodone-acetaminophen (NORCO) 10-325 MG tablet    Sig: Take 1 tablet by mouth every 8 (eight) hours as needed.    Dispense:  90 tablet    Refill:  0   HYDROcodone-acetaminophen (NORCO) 10-325 MG tablet    Sig: Take 1 tablet by mouth every 8 (eight) hours as needed.    Dispense:  90 tablet    Refill:  0

## 2021-03-22 NOTE — Telephone Encounter (Signed)
Pt's husband lvm stating that her prescriptions are now supposed to go to [facility] NOT [facility].   He asked that her February prescription goes to [facility]

## 2021-03-26 ENCOUNTER — Ambulatory Visit (INDEPENDENT_AMBULATORY_CARE_PROVIDER_SITE_OTHER): Payer: Medicare Other | Admitting: Family Medicine

## 2021-03-26 ENCOUNTER — Encounter: Payer: Self-pay | Admitting: Family Medicine

## 2021-03-26 ENCOUNTER — Other Ambulatory Visit: Payer: Self-pay

## 2021-03-26 VITALS — BP 107/40 | HR 68 | Resp 18 | Ht 63.0 in

## 2021-03-26 DIAGNOSIS — I4891 Unspecified atrial fibrillation: Secondary | ICD-10-CM | POA: Diagnosis present

## 2021-03-26 DIAGNOSIS — R600 Localized edema: Secondary | ICD-10-CM | POA: Diagnosis not present

## 2021-03-26 DIAGNOSIS — Z6841 Body Mass Index (BMI) 40.0 and over, adult: Secondary | ICD-10-CM | POA: Diagnosis not present

## 2021-03-26 DIAGNOSIS — L03115 Cellulitis of right lower limb: Secondary | ICD-10-CM | POA: Diagnosis not present

## 2021-03-26 DIAGNOSIS — E873 Alkalosis: Secondary | ICD-10-CM | POA: Diagnosis not present

## 2021-03-26 DIAGNOSIS — R4781 Slurred speech: Secondary | ICD-10-CM

## 2021-03-26 DIAGNOSIS — I482 Chronic atrial fibrillation, unspecified: Secondary | ICD-10-CM | POA: Diagnosis not present

## 2021-03-26 DIAGNOSIS — I5023 Acute on chronic systolic (congestive) heart failure: Secondary | ICD-10-CM | POA: Diagnosis not present

## 2021-03-26 DIAGNOSIS — R410 Disorientation, unspecified: Secondary | ICD-10-CM | POA: Diagnosis not present

## 2021-03-26 DIAGNOSIS — K5900 Constipation, unspecified: Secondary | ICD-10-CM | POA: Diagnosis not present

## 2021-03-26 DIAGNOSIS — I517 Cardiomegaly: Secondary | ICD-10-CM | POA: Diagnosis not present

## 2021-03-26 DIAGNOSIS — N200 Calculus of kidney: Secondary | ICD-10-CM | POA: Diagnosis not present

## 2021-03-26 DIAGNOSIS — I110 Hypertensive heart disease with heart failure: Secondary | ICD-10-CM | POA: Diagnosis not present

## 2021-03-26 DIAGNOSIS — R918 Other nonspecific abnormal finding of lung field: Secondary | ICD-10-CM | POA: Diagnosis not present

## 2021-03-26 DIAGNOSIS — Z23 Encounter for immunization: Secondary | ICD-10-CM | POA: Diagnosis not present

## 2021-03-26 DIAGNOSIS — I5043 Acute on chronic combined systolic (congestive) and diastolic (congestive) heart failure: Secondary | ICD-10-CM | POA: Diagnosis not present

## 2021-03-26 DIAGNOSIS — J9621 Acute and chronic respiratory failure with hypoxia: Secondary | ICD-10-CM | POA: Diagnosis not present

## 2021-03-26 DIAGNOSIS — D500 Iron deficiency anemia secondary to blood loss (chronic): Secondary | ICD-10-CM | POA: Diagnosis present

## 2021-03-26 DIAGNOSIS — F17210 Nicotine dependence, cigarettes, uncomplicated: Secondary | ICD-10-CM | POA: Diagnosis not present

## 2021-03-26 DIAGNOSIS — I051 Rheumatic mitral insufficiency: Secondary | ICD-10-CM | POA: Diagnosis not present

## 2021-03-26 DIAGNOSIS — D649 Anemia, unspecified: Secondary | ICD-10-CM | POA: Diagnosis not present

## 2021-03-26 DIAGNOSIS — G9341 Metabolic encephalopathy: Secondary | ICD-10-CM | POA: Diagnosis not present

## 2021-03-26 DIAGNOSIS — S065X9A Traumatic subdural hemorrhage with loss of consciousness of unspecified duration, initial encounter: Secondary | ICD-10-CM | POA: Diagnosis not present

## 2021-03-26 DIAGNOSIS — S065XAA Traumatic subdural hemorrhage with loss of consciousness status unknown, initial encounter: Secondary | ICD-10-CM | POA: Diagnosis not present

## 2021-03-26 DIAGNOSIS — J9612 Chronic respiratory failure with hypercapnia: Secondary | ICD-10-CM | POA: Diagnosis not present

## 2021-03-26 DIAGNOSIS — M1612 Unilateral primary osteoarthritis, left hip: Secondary | ICD-10-CM | POA: Diagnosis not present

## 2021-03-26 DIAGNOSIS — I130 Hypertensive heart and chronic kidney disease with heart failure and stage 1 through stage 4 chronic kidney disease, or unspecified chronic kidney disease: Secondary | ICD-10-CM | POA: Diagnosis not present

## 2021-03-26 DIAGNOSIS — R0902 Hypoxemia: Secondary | ICD-10-CM

## 2021-03-26 DIAGNOSIS — F1029 Alcohol dependence with unspecified alcohol-induced disorder: Secondary | ICD-10-CM

## 2021-03-26 DIAGNOSIS — Z720 Tobacco use: Secondary | ICD-10-CM | POA: Diagnosis not present

## 2021-03-26 DIAGNOSIS — E785 Hyperlipidemia, unspecified: Secondary | ICD-10-CM | POA: Diagnosis not present

## 2021-03-26 DIAGNOSIS — M25552 Pain in left hip: Secondary | ICD-10-CM | POA: Diagnosis not present

## 2021-03-26 DIAGNOSIS — I483 Typical atrial flutter: Secondary | ICD-10-CM | POA: Diagnosis not present

## 2021-03-26 DIAGNOSIS — E876 Hypokalemia: Secondary | ICD-10-CM | POA: Diagnosis not present

## 2021-03-26 DIAGNOSIS — N3289 Other specified disorders of bladder: Secondary | ICD-10-CM | POA: Diagnosis not present

## 2021-03-26 DIAGNOSIS — I10 Essential (primary) hypertension: Secondary | ICD-10-CM | POA: Diagnosis not present

## 2021-03-26 DIAGNOSIS — E278 Other specified disorders of adrenal gland: Secondary | ICD-10-CM | POA: Diagnosis not present

## 2021-03-26 DIAGNOSIS — N3000 Acute cystitis without hematuria: Secondary | ICD-10-CM | POA: Diagnosis not present

## 2021-03-26 DIAGNOSIS — J9622 Acute and chronic respiratory failure with hypercapnia: Secondary | ICD-10-CM | POA: Diagnosis not present

## 2021-03-26 DIAGNOSIS — J90 Pleural effusion, not elsewhere classified: Secondary | ICD-10-CM | POA: Diagnosis not present

## 2021-03-26 DIAGNOSIS — I3481 Nonrheumatic mitral (valve) annulus calcification: Secondary | ICD-10-CM | POA: Diagnosis not present

## 2021-03-26 DIAGNOSIS — F1020 Alcohol dependence, uncomplicated: Secondary | ICD-10-CM | POA: Insufficient documentation

## 2021-03-26 DIAGNOSIS — I288 Other diseases of pulmonary vessels: Secondary | ICD-10-CM | POA: Diagnosis not present

## 2021-03-26 DIAGNOSIS — J449 Chronic obstructive pulmonary disease, unspecified: Secondary | ICD-10-CM | POA: Diagnosis present

## 2021-03-26 DIAGNOSIS — L03116 Cellulitis of left lower limb: Secondary | ICD-10-CM | POA: Diagnosis not present

## 2021-03-26 DIAGNOSIS — E8881 Metabolic syndrome: Secondary | ICD-10-CM | POA: Diagnosis not present

## 2021-03-26 NOTE — Progress Notes (Signed)
Acute Office Visit  Subjective:    Patient ID: GABRYELA KIMBRELL, female    DOB: 1950-07-07, 71 y.o.   MRN: [id number]  Chief Complaint  Patient presents with   Weakness    In legs and arms, difficulty walking and falling several times for 1 week.    leg concern    Blisters on legs for several weeks, painful.     HPI Patient is in today for weakness.  She comes in today with her husband who reports that she has felt so weak that she can barely bear weight for the last couple of days.  She is fallen several times and even just fallen backwards in bed trying to get out and is hit the left side of her face.  She says that she can see out of her left eye and is not having any jaw problems or pain but she has a significant bruise over the left side of her face.  She is also developed weeping blisters on her legs that started a few weeks ago they have been painful.  She had a similar episode to this about 6 months ago and I referred her for echocardiogram for work-up for heart failure.  I had asked to try to get her back in with cardiology and we tried to do so for months.  They her husband admits that oftentimes they ignore phone calls thinking that it scams.  We were finally able to get her in about a month ago and they did really did not address her fluid level.  Evidently at that time she told them that she was not experiencing any symptoms or shortness of breath.  Past Medical History:  Diagnosis Date   Adenomatous polyps 2004    Colonoscopy done in [place]    Back pain    Complication of anesthesia    COPD (chronic obstructive pulmonary disease) (HCC)    Dyspnea    pain contributes but also pt. reveals that she has been out of her Spiriva for one month +   GERD (gastroesophageal reflux disease)    Hemorrhoids    Hyperlipidemia    Hypertension    Osteoarthritis    PONV (postoperative nausea and vomiting)    scopolamine patch used with success with a prev.  surg.     Past Surgical History:  Procedure Laterality Date   CATARACT EXTRACTION, BILATERAL Bilateral 04/2020   CHOLECYSTECTOMY  05/08/2007   JOINT REPLACEMENT Bilateral    left knee replacement  05/2005   right on 2-06   LUMBAR LAMINECTOMY/DECOMPRESSION MICRODISCECTOMY N/A 03/01/2016   Procedure: LEFT AND CENTRAL L3-4 AND LEFT L4-5 DECOMPRESSIVE LUMBAR LAMINECTOMY FOR SPINAL STENOSIS;  Surgeon: Jessy Oto, MD;  Location: [place];  Service: Orthopedics;  Laterality: N/A;   MULTIPLE TOOTH EXTRACTIONS      Family History  Problem Relation Age of Onset   Depression Mother    Hypertension Mother    Hyperlipidemia Mother    COPD Mother    Lung cancer Mother    Hyperlipidemia Father    Hypertension Father    Breast cancer Maternal Grandmother    Colon cancer Neg Hx        Neg     Social History   Socioeconomic History   Marital status: Married    Spouse name: Rhondalyn Clingan   Number of children: 0   Years of education: 12   Highest education level: 12th grade  Occupational History   Occupation:  Caregiver   Occupation: Retired.  Tobacco Use   Smoking status: Every Day    Packs/day: 0.50    Years: 49.00    Pack years: 24.50    Types: Cigarettes   Smokeless tobacco: Never   Tobacco comments:    1/2 pack a day   Vaping Use   Vaping Use: Never used  Substance and Sexual Activity   Alcohol use: Yes    Alcohol/week: 0.0 standard drinks    Comment: one drink a week    Drug use: No   Sexual activity: Not on file  Other Topics Concern   Not on file  Social History Narrative   Lives with her husband. She enjoys reading and adult coloring.    Social Determinants of Health   Financial Resource Strain: Low Risk    Difficulty of Paying Living Expenses: Not hard at all  Food Insecurity: No Food Insecurity   Worried About Charity fundraiser in the Last Year: Never true   [place] in the Last Year: Never true  Transportation Needs: No Transportation Needs    Lack of Transportation (Medical): No   Lack of Transportation (Non-Medical): No  Physical Activity: Inactive   Days of Exercise per Week: 0 days   Minutes of Exercise per Session: 0 min  Stress: No Stress Concern Present   Feeling of Stress : Not at all  Social Connections: Socially Isolated   Frequency of Communication with Friends and Family: Once a week   Frequency of Social Gatherings with Friends and Family: Once a week   Attends Religious Services: Never   Marine scientist or Organizations: No   Attends Music therapist: Never   Marital Status: Married  Human resources officer Violence: Not At Risk   Fear of Current or Ex-Partner: No   Emotionally Abused: No   Physically Abused: No   Sexually Abused: No    Outpatient Medications Prior to Visit  Medication Sig Dispense Refill   albuterol (VENTOLIN HFA) 108 (90 Base) MCG/ACT inhaler Inhale 1-2 puffs into the lungs every 6 (six) hours as needed for wheezing or shortness of breath. 18 each 1   carvedilol (COREG) 3.125 MG tablet TAKE 1 TABLET TWICE A DAY WITH FOOD 180 tablet 0   DULoxetine (CYMBALTA) 60 MG capsule TAKE 1 CAPSULE BY MOUTH EVERY DAY 90 capsule 1   Fluticasone-Umeclidin-Vilant (TRELEGY ELLIPTA) 100-62.5-25 MCG/ACT AEPB Inhale 1 puff into the lungs daily. 3 each 3   furosemide (LASIX) 40 MG tablet Take 1 tablet (40 mg total) by mouth 2 (two) times daily. 180 tablet 1   HYDROcodone-acetaminophen (NORCO) 10-325 MG tablet Take 1 tablet by mouth every 8 (eight) hours as needed. 90 tablet 0   [START ON 04/19/2021] HYDROcodone-acetaminophen (NORCO) 10-325 MG tablet Take 1 tablet by mouth every 8 (eight) hours as needed. 90 tablet 0   [START ON 05/16/2021] HYDROcodone-acetaminophen (NORCO) 10-325 MG tablet Take 1 tablet by mouth every 8 (eight) hours as needed. 90 tablet 0   lisinopril (ZESTRIL) 5 MG tablet TAKE 1 TABLET (5 MG TOTAL) BY MOUTH DAILY. 90 tablet 0   methocarbamol (ROBAXIN) 500 MG tablet Take by mouth.      metoprolol succinate (TOPROL-XL) 50 MG 24 hr tablet Take 50 mg by mouth daily. Take with or immediately following a meal.     Multiple Vitamin (MULTIVITAMIN) capsule Take 1 capsule by mouth daily.       Omega-3 Fatty Acids (FISH OIL) 1000 MG CAPS Take  by mouth.     rivaroxaban (XARELTO) 20 MG TABS tablet Take 1 tablet (20 mg total) by mouth daily. 90 tablet 1   rosuvastatin (CRESTOR) 40 MG tablet Take 1 tablet (40 mg total) by mouth at bedtime. 90 tablet 3   No facility-administered medications prior to visit.    Allergies  Allergen Reactions   Nsaids Other (See Comments)    Review of Systems     Objective:    Physical Exam Vitals and nursing note reviewed.  Constitutional:      Appearance: She is well-developed.  HENT:     Head: Normocephalic and atraumatic.     Comments: Significant bruising over the left eyelid and left facial cheek.    Left Ear: External ear normal.     Mouth/Throat:     Mouth: Mucous membranes are dry.  Eyes:     Comments: Conjunctive are erythematous.  Cardiovascular:     Rate and Rhythm: Normal rate and regular rhythm.     Heart sounds: Normal heart sounds.  Pulmonary:     Effort: Pulmonary effort is normal.     Comments: Crackles at the bases bilaterally. Abdominal:     Comments: Fullness and tenderness in the right lower quadrant of her abdomen.  Skin:    General: Skin is warm and dry.     Comments: 2+ pitting edema up to her upper thigh.  [place] swelling with fluid blisters on both lower legs and peau d'orange skin that is significantly erythematous on both lower extremities.  Neurological:     Mental Status: She is alert and oriented to person, place, and time.  Psychiatric:        Behavior: Behavior normal.    BP (!) 107/40    Pulse 68    Resp 18    Ht _0  (1.6 m)    SpO2 (!) 75%    BMI 38.79 kg/m  Wt Readings from Last 3 Encounters:  02/18/21 219 lb (99.3 kg)  11/19/20 222 lb 1.9 oz (100.8 kg)  08/18/20 221 lb (100.2 kg)     Health Maintenance Due  Topic Date Due   Pneumonia Vaccine 33+ Years old (2 - PCV) 05/14/2019    There are no preventive care reminders to display for this patient.   Lab Results  Component Value Date   TSH 1.75 08/22/2018   Lab Results  Component Value Date   WBC 9.1 07/20/2020   HGB 13.1 07/20/2020   HCT 42.8 07/20/2020   MCV 94.9 07/20/2020   PLT 127 (L) 07/20/2020   Lab Results  Component Value Date   NA 141 02/18/2021   K 3.7 02/18/2021   CO2 34 (H) 02/18/2021   GLUCOSE 122 (H) 02/18/2021   BUN 14 02/18/2021   CREATININE 0.76 02/18/2021   BILITOT 1.9 (H) 12/04/2020   ALKPHOS 95 08/22/2016   AST 15 12/04/2020   ALT 22 12/04/2020   PROT 6.2 12/04/2020   ALBUMIN 4.3 08/22/2016   CALCIUM 10.2 02/18/2021   ANIONGAP 7 03/02/2016   EGFR 86 02/18/2021   Lab Results  Component Value Date   CHOL 98 12/04/2020   Lab Results  Component Value Date   HDL 41 (L) 12/04/2020   Lab Results  Component Value Date   LDLCALC 41 12/04/2020   Lab Results  Component Value Date   TRIG 82 12/04/2020   Lab Results  Component Value Date   CHOLHDL 2.4 12/04/2020   Lab Results  Component Value Date  HGBA1C 5.8 (A) 02/18/2021       Assessment & Plan:   Problem List Items Addressed This Visit       Cardiovascular and Mediastinum   Acute on chronic systolic heart failure ([place])     Other   Alcohol dependence ([place])   Other Visit Diagnoses     Need for pneumococcal vaccination    -  Primary   Hypoxemia       Slurred speech          Acute on chronic systolic heart failure-I am concerned because her mouth looks extremely dry, she is hypotensive, but yet she has 2-3+ pitting edema all the way up to her hips bilaterally.  She also has a lot of fullness in her right lower abdomen that is very concerning.  She is slurring her speech today which is not typical.  When asked her husband about it he says that its been going on for a little while.  Some also concerned  that she may have had some type of underlying neurologic event.  She is also twitching on exam today which I am also not seen before which is also concerning.  Hypoxemia-pulse ox was in the 70s upon arrival today.  It did come up after 2 L of oxygen.  She does not have home oxygen even though we have discussed this a couple different times.  Again she says she never got a call from the home health company but they also report that they do not really answer their phone because of scammers etc.  After discussion with her and her husband I recommended that she go to the emergency department.  He felt safe to drive her in the hospital is only 5 minutes from her office so I felt like that was reasonable option versus EMS.  No orders of the defined types were placed in this encounter.    Beatrice Lecher, MD

## 2021-04-02 DIAGNOSIS — Z716 Tobacco abuse counseling: Secondary | ICD-10-CM | POA: Diagnosis not present

## 2021-04-02 DIAGNOSIS — G9341 Metabolic encephalopathy: Secondary | ICD-10-CM | POA: Diagnosis not present

## 2021-04-02 DIAGNOSIS — R600 Localized edema: Secondary | ICD-10-CM | POA: Diagnosis not present

## 2021-04-02 DIAGNOSIS — L03116 Cellulitis of left lower limb: Secondary | ICD-10-CM | POA: Diagnosis not present

## 2021-04-02 DIAGNOSIS — Z720 Tobacco use: Secondary | ICD-10-CM | POA: Diagnosis not present

## 2021-04-02 DIAGNOSIS — J9620 Acute and chronic respiratory failure, unspecified whether with hypoxia or hypercapnia: Secondary | ICD-10-CM | POA: Diagnosis not present

## 2021-04-02 DIAGNOSIS — I482 Chronic atrial fibrillation, unspecified: Secondary | ICD-10-CM | POA: Diagnosis not present

## 2021-04-02 DIAGNOSIS — F17210 Nicotine dependence, cigarettes, uncomplicated: Secondary | ICD-10-CM | POA: Diagnosis not present

## 2021-04-02 DIAGNOSIS — R2689 Other abnormalities of gait and mobility: Secondary | ICD-10-CM | POA: Diagnosis not present

## 2021-04-02 DIAGNOSIS — J9622 Acute and chronic respiratory failure with hypercapnia: Secondary | ICD-10-CM | POA: Diagnosis not present

## 2021-04-02 DIAGNOSIS — J9621 Acute and chronic respiratory failure with hypoxia: Secondary | ICD-10-CM | POA: Diagnosis not present

## 2021-04-02 DIAGNOSIS — S065XAA Traumatic subdural hemorrhage with loss of consciousness status unknown, initial encounter: Secondary | ICD-10-CM | POA: Diagnosis not present

## 2021-04-02 DIAGNOSIS — N3000 Acute cystitis without hematuria: Secondary | ICD-10-CM | POA: Diagnosis not present

## 2021-04-02 DIAGNOSIS — K5900 Constipation, unspecified: Secondary | ICD-10-CM | POA: Diagnosis not present

## 2021-04-02 DIAGNOSIS — I110 Hypertensive heart disease with heart failure: Secondary | ICD-10-CM | POA: Diagnosis not present

## 2021-04-02 DIAGNOSIS — D649 Anemia, unspecified: Secondary | ICD-10-CM | POA: Diagnosis not present

## 2021-04-02 DIAGNOSIS — Z743 Need for continuous supervision: Secondary | ICD-10-CM | POA: Diagnosis not present

## 2021-04-02 DIAGNOSIS — M25552 Pain in left hip: Secondary | ICD-10-CM | POA: Diagnosis not present

## 2021-04-02 DIAGNOSIS — I872 Venous insufficiency (chronic) (peripheral): Secondary | ICD-10-CM | POA: Diagnosis not present

## 2021-04-02 DIAGNOSIS — R269 Unspecified abnormalities of gait and mobility: Secondary | ICD-10-CM | POA: Diagnosis not present

## 2021-04-02 DIAGNOSIS — L03119 Cellulitis of unspecified part of limb: Secondary | ICD-10-CM | POA: Diagnosis not present

## 2021-04-02 DIAGNOSIS — I959 Hypotension, unspecified: Secondary | ICD-10-CM | POA: Diagnosis not present

## 2021-04-02 DIAGNOSIS — E782 Mixed hyperlipidemia: Secondary | ICD-10-CM | POA: Diagnosis not present

## 2021-04-02 DIAGNOSIS — L03115 Cellulitis of right lower limb: Secondary | ICD-10-CM | POA: Diagnosis not present

## 2021-04-02 DIAGNOSIS — L97919 Non-pressure chronic ulcer of unspecified part of right lower leg with unspecified severity: Secondary | ICD-10-CM | POA: Diagnosis not present

## 2021-04-02 DIAGNOSIS — E8881 Metabolic syndrome: Secondary | ICD-10-CM | POA: Diagnosis not present

## 2021-04-02 DIAGNOSIS — R531 Weakness: Secondary | ICD-10-CM | POA: Diagnosis not present

## 2021-04-02 DIAGNOSIS — I739 Peripheral vascular disease, unspecified: Secondary | ICD-10-CM | POA: Diagnosis not present

## 2021-04-02 DIAGNOSIS — Z6837 Body mass index (BMI) 37.0-37.9, adult: Secondary | ICD-10-CM | POA: Diagnosis not present

## 2021-04-02 DIAGNOSIS — I5023 Acute on chronic systolic (congestive) heart failure: Secondary | ICD-10-CM | POA: Diagnosis not present

## 2021-04-02 DIAGNOSIS — R238 Other skin changes: Secondary | ICD-10-CM | POA: Diagnosis not present

## 2021-04-02 DIAGNOSIS — B9620 Unspecified Escherichia coli [E. coli] as the cause of diseases classified elsewhere: Secondary | ICD-10-CM | POA: Diagnosis not present

## 2021-04-02 DIAGNOSIS — I10 Essential (primary) hypertension: Secondary | ICD-10-CM | POA: Diagnosis not present

## 2021-04-02 DIAGNOSIS — S065XAD Traumatic subdural hemorrhage with loss of consciousness status unknown, subsequent encounter: Secondary | ICD-10-CM | POA: Diagnosis not present

## 2021-04-02 DIAGNOSIS — S81001D Unspecified open wound, right knee, subsequent encounter: Secondary | ICD-10-CM | POA: Diagnosis not present

## 2021-04-02 DIAGNOSIS — J449 Chronic obstructive pulmonary disease, unspecified: Secondary | ICD-10-CM | POA: Diagnosis not present

## 2021-04-02 DIAGNOSIS — D500 Iron deficiency anemia secondary to blood loss (chronic): Secondary | ICD-10-CM | POA: Diagnosis not present

## 2021-04-02 DIAGNOSIS — I4891 Unspecified atrial fibrillation: Secondary | ICD-10-CM | POA: Diagnosis not present

## 2021-04-12 ENCOUNTER — Ambulatory Visit (INDEPENDENT_AMBULATORY_CARE_PROVIDER_SITE_OTHER): Payer: Medicare Other | Admitting: Acute Care

## 2021-04-12 ENCOUNTER — Encounter: Payer: Self-pay | Admitting: Family Medicine

## 2021-04-12 ENCOUNTER — Other Ambulatory Visit: Payer: Self-pay

## 2021-04-12 ENCOUNTER — Encounter: Payer: Self-pay | Admitting: Acute Care

## 2021-04-12 ENCOUNTER — Telehealth: Payer: Self-pay

## 2021-04-12 ENCOUNTER — Telehealth: Payer: Self-pay | Admitting: Family Medicine

## 2021-04-12 ENCOUNTER — Ambulatory Visit (INDEPENDENT_AMBULATORY_CARE_PROVIDER_SITE_OTHER): Payer: Medicare Other | Admitting: Family Medicine

## 2021-04-12 VITALS — BP 124/61 | HR 87 | Resp 18 | Ht 63.0 in | Wt 189.0 lb

## 2021-04-12 DIAGNOSIS — E6609 Other obesity due to excess calories: Secondary | ICD-10-CM | POA: Diagnosis not present

## 2021-04-12 DIAGNOSIS — R0902 Hypoxemia: Secondary | ICD-10-CM

## 2021-04-12 DIAGNOSIS — S065XAA Traumatic subdural hemorrhage with loss of consciousness status unknown, initial encounter: Secondary | ICD-10-CM | POA: Diagnosis not present

## 2021-04-12 DIAGNOSIS — E785 Hyperlipidemia, unspecified: Secondary | ICD-10-CM | POA: Diagnosis not present

## 2021-04-12 DIAGNOSIS — L729 Follicular cyst of the skin and subcutaneous tissue, unspecified: Secondary | ICD-10-CM | POA: Diagnosis not present

## 2021-04-12 DIAGNOSIS — Z7901 Long term (current) use of anticoagulants: Secondary | ICD-10-CM | POA: Diagnosis not present

## 2021-04-12 DIAGNOSIS — M1612 Unilateral primary osteoarthritis, left hip: Secondary | ICD-10-CM | POA: Diagnosis not present

## 2021-04-12 DIAGNOSIS — I5023 Acute on chronic systolic (congestive) heart failure: Secondary | ICD-10-CM | POA: Diagnosis not present

## 2021-04-12 DIAGNOSIS — F17210 Nicotine dependence, cigarettes, uncomplicated: Secondary | ICD-10-CM

## 2021-04-12 DIAGNOSIS — J441 Chronic obstructive pulmonary disease with (acute) exacerbation: Secondary | ICD-10-CM | POA: Diagnosis not present

## 2021-04-12 DIAGNOSIS — F17200 Nicotine dependence, unspecified, uncomplicated: Secondary | ICD-10-CM | POA: Diagnosis not present

## 2021-04-12 DIAGNOSIS — I4891 Unspecified atrial fibrillation: Secondary | ICD-10-CM

## 2021-04-12 DIAGNOSIS — Z87440 Personal history of urinary (tract) infections: Secondary | ICD-10-CM | POA: Diagnosis not present

## 2021-04-12 DIAGNOSIS — I5043 Acute on chronic combined systolic (congestive) and diastolic (congestive) heart failure: Secondary | ICD-10-CM | POA: Diagnosis not present

## 2021-04-12 DIAGNOSIS — D649 Anemia, unspecified: Secondary | ICD-10-CM | POA: Diagnosis not present

## 2021-04-12 DIAGNOSIS — M6281 Muscle weakness (generalized): Secondary | ICD-10-CM | POA: Diagnosis not present

## 2021-04-12 DIAGNOSIS — I110 Hypertensive heart disease with heart failure: Secondary | ICD-10-CM | POA: Diagnosis not present

## 2021-04-12 DIAGNOSIS — I10 Essential (primary) hypertension: Secondary | ICD-10-CM | POA: Diagnosis not present

## 2021-04-12 DIAGNOSIS — J9620 Acute and chronic respiratory failure, unspecified whether with hypoxia or hypercapnia: Secondary | ICD-10-CM | POA: Diagnosis not present

## 2021-04-12 DIAGNOSIS — K5909 Other constipation: Secondary | ICD-10-CM | POA: Diagnosis not present

## 2021-04-12 DIAGNOSIS — Z6826 Body mass index (BMI) 26.0-26.9, adult: Secondary | ICD-10-CM | POA: Diagnosis not present

## 2021-04-12 DIAGNOSIS — R296 Repeated falls: Secondary | ICD-10-CM | POA: Diagnosis not present

## 2021-04-12 MED ORDER — DAPAGLIFLOZIN PROPANEDIOL 5 MG PO TABS: 5.0000 mg | ORAL_TABLET | Freq: Every day | ORAL | 1 refills | Status: AC

## 2021-04-12 MED ORDER — FUROSEMIDE 40 MG PO TABS: 40.0000 mg | ORAL_TABLET | Freq: Every day | ORAL | 0 refills | Status: AC

## 2021-04-12 NOTE — Progress Notes (Signed)
Established Patient Office Visit  Subjective:  Patient ID: Kelly Roth, female    DOB: 14-May-1950  Age: 71 y.o. MRN: [id number]  CC:  Chief Complaint  Patient presents with   [facility].    Discuss Medication    Patient states she bleeds a lot and would like to discuss the Xarelto.    Bump    On back of head, increased in size.     HPI Kelly Roth presents for hospital follow-up.  She was admitted on January 13 and discharged home on January 20, with a hospitalization of 8 days.  She was diagnosed with acute on chronic congestive heart failure and acute cystitis.  She also unfortunately experienced a head injury before admission which resulted in a subdural hematoma.  She was aggressively diuresed during the hospitalization but it evidently began having some low blood pressures so blood pressure medications were held temporarily.  Urine cultures grew out E. coli and she was treated with Rocephin in the hospital and then Keflex.  She was also discharged home with doxycycline for possible cellulitis in her lower extremities.  Was also given Senokot and MiraLAX for constipation and started on a PPI.  They did hold her anticoagulant because of the subdural hematoma.  Active Hospital Problems  Diagnosis Date Noted POA   Constipation 03/29/2021 Yes   Left hip pain 03/29/2021 Yes   Subdural hematoma 03/27/2021 Yes   Acute anemia 03/26/2021 Yes   Chronic hypercapnic respiratory failure (*) 03/26/2021 Yes   Bilateral edema of lower extremity 03/26/2021 Yes   Acute on chronic respiratory failure with hypoxia and hypercapnia (*) 03/26/2021 Yes   Acute on chronic diastolic heart failure (*) 03/26/2021 Yes   Tobacco abuse 02/20/2019 Yes   Chronic obstructive pulmonary disease (*) 02/27/2018 Yes   Typical atrial flutter (*)- s/p ablation 12-2017 08/23/2016 Yes   Essential hypertension 76/72/0947 Yes   Metabolic syndrome 09/62/8366 Yes   Morbid obesity (*) 04/26/2011  Yes   Hyperlipidemia 05/23/2006 Yes   Resolved Hospital Problems  Diagnosis Date Noted Date Resolved POA   *Acute on chronic systolic heart failure (*) 03/26/2021 04/02/2021 Yes   Acute cystitis 03/26/2021 04/02/2021 Yes   Current Discharge Medication List   DISCONTINUED medications   carvedilol (COREG) 3.125 mg tablet   lisinopril (PRINIVIL,ZESTRIL) 5 mg tablet   methocarbamol (ROBAXIN) 500 MG tablet   XARELTO 20 MG TABS    NEW medications  Details  dapagliflozin (FARXIGA) 5 MG tablet Take one tablet (5 mg dose) by mouth daily. Start date: 04/03/2021  doxycycline monohydrate (MONODOX) 100 mg capsule Take one capsule (100 mg dose) by mouth every 12 (twelve) hours for 6 days. Start date: 04/02/2021, End date: 04/08/2021  melatonin 3 mg TABS Take one tablet (3 mg dose) by mouth at bedtime. Start date: 04/02/2021  pantoprazole sodium (PROTONIX) 40 mg tablet Take one tablet (40 mg dose) by mouth daily. Start date: 04/02/2021  polyethylene glycol (MIRALAX) 17 g packet Take seventeen g by mouth 2 (two) times daily for 3 days. Start date: 04/02/2021, End date: 04/05/2021  sennosides-docusate sodium (SENOKOT-S) 8.6-50 mg per tablet Take two tablets by mouth daily. Start date: 04/03/2021, End date: 04/03/2022    CHANGED medications  Details  furosemide (LASIX) 40 mg tablet Take one tablet (40 mg dose) by mouth daily. Start date: 04/02/2021   metoprolol succinate (TOPROL-XL) 25 mg 24 hr tablet Take one half tablet (12.5 mg dose) by mouth daily. Start date: 04/02/2021  CONTINUED medications  Details  albuterol sulfate HFA (PROVENTIL,VENTOLIN,PROAIR) 108 (90 Base) MCG/ACT inhaler Inhale two puffs into the lungs every 4 (four) hours as needed.   DULoxetine HCl (CYMBALTA) 60 mg capsule Take one capsule (60 mg dose) by mouth daily.   fluticasone-umeclidin-vilant (TRELEGY ELLIPTA) 100-62.5-25 MCG/ACT AEPB inhaler Inhale one puff into the lungs daily.   HYDROcodone-acetaminophen (NORCO)  10-325 mg per tablet Take one tablet by mouth every 8 (eight) hours as needed.   Naloxone HCl 4 MG/0.1ML LIQD nasal spray one spray by Nasal route once as needed. Use for opiate emergency. Repeat dose in 2-3 minus until pt responsive or EMS arrives   rosuvastatin calcium (CRESTOR) 40 mg tablet Take one tablet (40 mg dose) by mouth daily.   THERA (THEREMS,ONE-DAILY) TABS Take one tablet by mouth daily.     Hospital Course   Indication for Admission/chief complaint: Kelly Roth is a 71 y.o. White or Caucasian [1] female presented with weakness, lower leg edema and frequent falls. She did struck her head on the dresser the last time she fell. Spouse reported increased confusion. She was admitted for confusion, acute on chronic respiratory failure with hypoxia, fluid overload secondary to presumed acute on chronic systolic CHF  Hospital Course:   Acute on chronic systolic/diastolic heart failure with recovered EF Patient has prior history of reduced EF of 40%. She presented with increased lower extremity edema with blister development. Noted to be hypoxic in the ED and was placed on oxygen. BNP noted to be 30,000. Echocardiogram done on 03/27/21 showed EF of 60-65%, LV grade 2 diastolic dysfunction likely present, RVSP borderline elevated at 36-67 mmHG. CT chest/abd/pelvis showed moderate b/l pleural effusions and anasarca. Had been on Lasix drip at 5 mg/hr from 03/26/21 to 03/30/21. Lasix drip was stopped due to improved edema and blood pressures were on the lower side which limited further diuresis. Had to also decrease metoprolol XL dose to 12.62m and was unable to start other GDMT due to lower blood pressures. However, on day of discharge her blood pressures actually improved and I did resume Lasix 4107mdaily to optimize her volume status. Would recommend monitoring and if blood pressures remains stable can resume low dose Lisinopril and titrate up Metoprolol XL if able.  ? Continue  1500 mL fluid restriction ? Continue 2 g low-salt diet ? continue Farxiga 5 mg daily.    Acute on chronic hypoxic and hypercapnic respiratory failure ? Likely multifactorial and secondary to fluid overload/pleural effusions, obesity, possible sleep apnea. She says she can not tolerate the mask that we use with our autotitrating VPAP in the hospital. She will need outpatient sleep study.  ? Currently on 2 liters of supplemental oxygen via [place]. Wean as able.   Anemia, due to chronic blood loss ? May be some component of hemodilution but seemed out of proportion. Stool was mildly heme positive in the ED. She was placed on Protonix and anticoagulation was held. Hemoglobin dropped down to 6.5 on 03/27/21. She was transfused 2 units PRBCs and went up to 9.6, although it did trend down some. CT chest/abd/pelvis without retroperitoneal bleeding. Most recent hgb of 9.3 grams. Would continue Protonix daily for one month and consider GI evaluation if she has recurrent dip in hemoglobin.    Trace Subdural hematoma ? Due to fall that occurred 2 days prior to admission. Xarelto was held. Repeat head CT on 03/27/21 with no significant change. She had no focal deficits. Seen by Dr. KiKatherine Roann 03/27/21 with  no additional neurosurgical recommendations    Acute cystitis secondary to E coli ? Patient was given Rocephin x1 on 03/26/2021. Urine cultures grew E coli. She received Keflex 500 mg twice daily for 3 days completed on 03/29/2021    Atrial fibrillation on chronic anticoagulation (on presentation) ? Patient has known atrial fibrillation with upcoming plans to undergo cardioversion. However, due to SDH, Xarelto has been held and will continued to be held at discharge. Until she can resume anticoagulation, she is currently not a candidate for cardioversion. Would have to have further discussions about risk and benefits of restarting anticoagulation once patient's rehab is optimized.    Hyperlipidemia ? Continue  home rosuvastatin 40 mg daily   COPD with tobacco abuse ? Continue trelegy with prn albuterol nebs    Falls and generalized weakness ? Seen by PT/OT with AMPAC score of 13. Recommended post acute rehab stay with 24h supervision and assistance at DC. ? Transfer to inpatient rehab ?   Lower extremity skin blisters with questionable cellulitis ? Due to concern for cellulitis she did receive 5 days of IV Vancomycin before being transitioned to Doxycycline. Plan additional 6 days of Doxycycline.  ? Continue wound care:  Left lateral heel, right lower leg lateral and posterior leg wounds:  Clean area with secura cleanser, apply Coloplast protective ointment to wound edges and legs. Apply Anasept gel to wound bed. Cover with a Vaseline gauze, cover with ABD pad, secure with Kerlex and tape. CHANGE EVERY 12 HOURS AND PRN SOILING.   Confusion/Acute metabolic encephalopathy ? Family reported that patient has been confused and sleepy. Ammonia not elevated. ABG with pH 7.470, pCo2 52.3, pO2 52.5. Possibly related to CO2 retention. We did try autotitrating CPAP at the hospital but patient has not really been able to tolerate the mask. She probably has underlying sleep apnea and would benefit from outpatient sleep study.   Constipation- resolved ? Continue scheduled Miralax and Senokot   Left hip pain L Hip XR revealed mild degenerative changes without evidence of acute fracture or dislocation.   Physical Exam: BP (!) 115/56   Pulse 94   Temp 97.5 F (36.4 C)   Resp 24   Ht 1.549 m (5' 1")   Wt 88 kg (194 lb 1.6 oz)   SpO2 100%   BMI 36.67 kg/m  Alert. Left facial ecchymosis. Heart irregular. Lungs clear anteriorly. Trace pedal edema. Bilateral lower leg dressing in place   Past Medical History:  Diagnosis Date   Adenomatous polyps 2004    Colonoscopy done in [place]    Back pain    Complication of anesthesia    COPD (chronic obstructive pulmonary disease) (HCC)    Dyspnea     pain contributes but also pt. reveals that she has been out of her Spiriva for one month +   GERD (gastroesophageal reflux disease)    Hemorrhoids    Hyperlipidemia    Hypertension    Osteoarthritis    PONV (postoperative nausea and vomiting)    scopolamine patch used with success with a prev. surg.     Past Surgical History:  Procedure Laterality Date   CATARACT EXTRACTION, BILATERAL Bilateral 04/2020   CHOLECYSTECTOMY  05/08/2007   JOINT REPLACEMENT Bilateral    left knee replacement  05/2005   right on 2-06   LUMBAR LAMINECTOMY/DECOMPRESSION MICRODISCECTOMY N/A 03/01/2016   Procedure: LEFT AND CENTRAL L3-4 AND LEFT L4-5 DECOMPRESSIVE LUMBAR LAMINECTOMY FOR SPINAL STENOSIS;  Surgeon: Jessy Oto, MD;  Location: [facility];  Service: Orthopedics;  Laterality: N/A;   MULTIPLE TOOTH EXTRACTIONS      Family History  Problem Relation Age of Onset   Depression Mother    Hypertension Mother    Hyperlipidemia Mother    COPD Mother    Lung cancer Mother    Hyperlipidemia Father    Hypertension Father    Breast cancer Maternal Grandmother    Colon cancer Neg Hx        Neg     Social History   Socioeconomic History   Marital status: Married    Spouse name: Sayge Brienza   Number of children: 0   Years of education: 12   Highest education level: 12th grade  Occupational History   Occupation: Building control surveyor   Occupation: Retired.  Tobacco Use   Smoking status: Every Day    Packs/day: 1.00    Years: 51.00    Pack years: 51.00    Types: Cigarettes   Smokeless tobacco: Never   Tobacco comments:    1 pack a day   Vaping Use   Vaping Use: Never used  Substance and Sexual Activity   Alcohol use: Yes    Alcohol/week: 0.0 standard drinks    Comment: one drink a week    Drug use: No   Sexual activity: Not on file  Other Topics Concern   Not on file  Social History Narrative   Lives with her husband. She enjoys reading and adult coloring.    Social Determinants of Health    Financial Resource Strain: Low Risk    Difficulty of Paying Living Expenses: Not hard at all  Food Insecurity: No Food Insecurity   Worried About Charity fundraiser in the Last Year: Never true   [place] in the Last Year: Never true  Transportation Needs: No Transportation Needs   Lack of Transportation (Medical): No   Lack of Transportation (Non-Medical): No  Physical Activity: Inactive   Days of Exercise per Week: 0 days   Minutes of Exercise per Session: 0 min  Stress: No Stress Concern Present   Feeling of Stress : Not at all  Social Connections: Socially Isolated   Frequency of Communication with Friends and Family: Once a week   Frequency of Social Gatherings with Friends and Family: Once a week   Attends Religious Services: Never   Marine scientist or Organizations: No   Attends Music therapist: Never   Marital Status: Married  Human resources officer Violence: Not At Risk   Fear of Current or Ex-Partner: No   Emotionally Abused: No   Physically Abused: No   Sexually Abused: No    Outpatient Medications Prior to Visit  Medication Sig Dispense Refill   albuterol (VENTOLIN HFA) 108 (90 Base) MCG/ACT inhaler Inhale 1-2 puffs into the lungs every 6 (six) hours as needed for wheezing or shortness of breath. 18 each 1   DULoxetine (CYMBALTA) 60 MG capsule TAKE 1 CAPSULE BY MOUTH EVERY DAY 90 capsule 1   Fluticasone-Umeclidin-Vilant (TRELEGY ELLIPTA) 100-62.5-25 MCG/ACT AEPB Inhale 1 puff into the lungs daily. 3 each 3   HYDROcodone-acetaminophen (NORCO) 10-325 MG tablet Take 1 tablet by mouth every 8 (eight) hours as needed. 90 tablet 0   [START ON 04/19/2021] HYDROcodone-acetaminophen (NORCO) 10-325 MG tablet Take 1 tablet by mouth every 8 (eight) hours as needed. 90 tablet 0   [START ON 05/16/2021] HYDROcodone-acetaminophen (NORCO) 10-325 MG tablet Take 1 tablet by mouth every 8 (  eight) hours as needed. 90 tablet 0   lisinopril (ZESTRIL) 5 MG tablet TAKE 1  TABLET (5 MG TOTAL) BY MOUTH DAILY. 90 tablet 0   metoprolol succinate (TOPROL-XL) 50 MG 24 hr tablet Take 50 mg by mouth daily. Take with or immediately following a meal.     Multiple Vitamin (MULTIVITAMIN) capsule Take 1 capsule by mouth daily.       Omega-3 Fatty Acids (FISH OIL) 1000 MG CAPS Take by mouth.     rivaroxaban (XARELTO) 20 MG TABS tablet Take 1 tablet (20 mg total) by mouth daily. 90 tablet 1   rosuvastatin (CRESTOR) 40 MG tablet Take 1 tablet (40 mg total) by mouth at bedtime. 90 tablet 3   carvedilol (COREG) 3.125 MG tablet TAKE 1 TABLET TWICE A DAY WITH FOOD 180 tablet 0   furosemide (LASIX) 40 MG tablet Take 1 tablet (40 mg total) by mouth 2 (two) times daily. 180 tablet 1   methocarbamol (ROBAXIN) 500 MG tablet Take by mouth.     No facility-administered medications prior to visit.    Allergies  Allergen Reactions   Nsaids Other (See Comments)    ROS Review of Systems    Objective:    Physical Exam Constitutional:      Appearance: Normal appearance. She is well-developed.  HENT:     Head: Normocephalic and atraumatic.  Cardiovascular:     Rate and Rhythm: Normal rate and regular rhythm.     Heart sounds: Normal heart sounds.  Pulmonary:     Effort: Pulmonary effort is normal.     Breath sounds: Normal breath sounds.  Musculoskeletal:     Comments: Trace edema bilaterally.  Skin is peeling around the ankles.  Skin:    General: Skin is warm and dry.  Neurological:     Mental Status: She is alert and oriented to person, place, and time.  Psychiatric:        Behavior: Behavior normal.    BP 124/61    Pulse 87    Resp 18    Ht 5' 3" (1.6 m)    Wt 189 lb (85.7 kg)    SpO2 96%    BMI 33.48 kg/m  Wt Readings from Last 3 Encounters:  04/12/21 189 lb (85.7 kg)  02/18/21 219 lb (99.3 kg)  11/19/20 222 lb 1.9 oz (100.8 kg)     Health Maintenance Due  Topic Date Due   Pneumonia Vaccine 33+ Years old (2 - PCV) 05/14/2019    There are no preventive  care reminders to display for this patient.  Lab Results  Component Value Date   TSH 1.75 08/22/2018   Lab Results  Component Value Date   WBC 9.1 07/20/2020   HGB 13.1 07/20/2020   HCT 42.8 07/20/2020   MCV 94.9 07/20/2020   PLT 127 (L) 07/20/2020   Lab Results  Component Value Date   NA 141 02/18/2021   K 3.7 02/18/2021   CO2 34 (H) 02/18/2021   GLUCOSE 122 (H) 02/18/2021   BUN 14 02/18/2021   CREATININE 0.76 02/18/2021   BILITOT 1.9 (H) 12/04/2020   ALKPHOS 95 08/22/2016   AST 15 12/04/2020   ALT 22 12/04/2020   PROT 6.2 12/04/2020   ALBUMIN 4.3 08/22/2016   CALCIUM 10.2 02/18/2021   ANIONGAP 7 03/02/2016   EGFR 86 02/18/2021   Lab Results  Component Value Date   CHOL 98 12/04/2020   Lab Results  Component Value Date   HDL 41 (L) 12/04/2020  Lab Results  Component Value Date   LDLCALC 41 12/04/2020   Lab Results  Component Value Date   TRIG 82 12/04/2020   Lab Results  Component Value Date   CHOLHDL 2.4 12/04/2020   Lab Results  Component Value Date   HGBA1C 5.8 (A) 02/18/2021      Assessment & Plan:   Problem List Items Addressed This Visit       Cardiovascular and Mediastinum   HYPERTENSION, BENIGN    Blood pressure looks really good today.  I am a little concerned that she may be confused about her medication list (updated 1 today.  So I do want her to let me know if she is taking things differently.      Relevant Medications   furosemide (LASIX) 40 MG tablet   Atrial fibrillation (HCC)    She is okay to restart her blood thinner at this point.  She has been holding it since she fell and hit her head and experienced a's very small subdural hematoma.      Relevant Medications   furosemide (LASIX) 40 MG tablet   Acute on chronic systolic heart failure ([place]) - Primary    They added Farxiga at the hospital.  We will hopefully she will be able to get this covered.  Also discussed that on her home scale her dry weight is around 190 pounds.   So if at any point her weight goes up more than 3 pounds from this and she needs to take an extra furosemide she will need to take 2 instead of 1.      Relevant Medications   furosemide (LASIX) 40 MG tablet   dapagliflozin propanediol (FARXIGA) 5 MG TABS tablet     Respiratory   Hypoxia    Once they were able to remove all the excess fluid she has been breathing much better and has not needed her oxygen.      Other Visit Diagnoses     Scalp cyst       Relevant Orders   Ambulatory referral to Dermatology   Subdural hematoma           Has a pretty large cyst on her scalp.  We will place referral to dermatology for excision.  Subdural hematoma-her Eliquis was held during hospitalization.  Meds ordered this encounter  Medications   furosemide (LASIX) 40 MG tablet    Sig: Take 1 tablet (40 mg total) by mouth daily. OK to take 2 tabs if weight is > 193 lbs    Dispense:  1 tablet    Refill:  0   dapagliflozin propanediol (FARXIGA) 5 MG TABS tablet    Sig: Take 1 tablet (5 mg total) by mouth daily before breakfast.    Dispense:  90 tablet    Refill:  1    Follow-up: Return in about 4 weeks (around 05/10/2021).   I spent 40 minutes on the day of the encounter to include pre-visit record review, face-to-face time with the patient and post visit ordering of test.   Beatrice Lecher, MD

## 2021-04-12 NOTE — Progress Notes (Addendum)
Virtual Visit via Telephone Note  I connected with Theda Belfast on 04/12/21 at 11:00 AM EST by telephone and verified that I am speaking with the correct person using two identifiers.  Location: Patient:  At home Provider: [place], [place], [place], [address]    I discussed the limitations, risks, security and privacy concerns of performing an evaluation and management service by telephone and the availability of in person appointments. I also discussed with the patient that there may be a patient responsible charge related to this service. The patient expressed understanding and agreed to proceed.   Shared Decision Making Visit Lung Cancer Screening Program [id number])   Eligibility: Age 71 y.o. Pack Years Smoking History Calculation 51 pack year smoking history (# packs/per year x # years smoked) Recent History of coughing up blood  no Unexplained weight loss? no ( >Than 15 pounds within the last 6 months ) Prior History Lung / other cancer no (Diagnosis within the last 5 years already requiring surveillance chest CT Scans). Smoking Status Current Smoker Former Smokers: Years since quit: NA  Quit Date: NA  Visit Components: Discussion included one or more decision making aids. yes Discussion included risk/benefits of screening. yes Discussion included potential follow up diagnostic testing for abnormal scans. yes Discussion included meaning and risk of over diagnosis. yes Discussion included meaning and risk of False Positives. yes Discussion included meaning of total radiation exposure. yes  Counseling Included: Importance of adherence to annual lung cancer LDCT screening. yes Impact of comorbidities on ability to participate in the program. yes Ability and willingness to under diagnostic treatment. yes  Smoking Cessation Counseling: Current Smokers:  Discussed importance of smoking cessation. yes Information about tobacco cessation classes and  interventions provided to patient. yes Patient provided with "ticket" for LDCT Scan. yes Symptomatic Patient. no  Counseling NA Diagnosis Code: Tobacco Use Z72.0 Asymptomatic Patient yes  Counseling (Intermediate counseling: > three minutes counseling) A1287 Former Smokers:  Discussed the importance of maintaining cigarette abstinence. yes Diagnosis Code: Personal History of Nicotine Dependence. O67.672 Information about tobacco cessation classes and interventions provided to patient. Yes Patient provided with "ticket" for LDCT Scan. yes Written Order for Lung Cancer Screening with LDCT placed in Epic. Yes (CT Chest Lung Cancer Screening Low Dose W/O CM) [id number] Z12.2-Screening of respiratory organs Z87.891-Personal history of nicotine dependence  I have spent 25 minutes of face to face/ virtual visit   time with  Ms. Jeanbaptiste discussing the risks and benefits of lung cancer screening. We viewed / discussed a power point together that explained in detail the above noted topics. We paused at intervals to allow for questions to be asked and answered to ensure understanding.We discussed that the single most powerful action that she can take to decrease her risk of developing lung cancer is to quit smoking. We discussed whether or not she is ready to commit to setting a quit date. We discussed options for tools to aid in quitting smoking including nicotine replacement therapy, non-nicotine medications, support groups, Quit Smart classes, and behavior modification. We discussed that often times setting smaller, more achievable goals, such as eliminating 1 cigarette a day for a week and then 2 cigarettes a day for a week can be helpful in slowly decreasing the number of cigarettes smoked. This allows for a sense of accomplishment as well as providing a clinical benefit. I provided  her  with smoking cessation  information  with contact information for community resources, classes, free nicotine  replacement therapy, and access to mobile apps, text messaging, and on-line smoking cessation help. I have also provided  her  the office contact information in the event she needs to contact me, or the screening staff. We discussed the time and location of the scan, and that either Doroteo Glassman RN, Joella Prince, RN  or I will call / send a letter with the results within 24-72 hours of receiving them. The patient verbalized understanding of all of  the above and had no further questions upon leaving the office. They have my contact information in the event they have any further questions.  I spent 3 minutes counseling on smoking cessation and the health risks of continued tobacco abuse.  I explained to the patient that there has been a high incidence of coronary artery disease noted on these exams. I explained that this is a non-gated exam therefore degree or severity cannot be determined. This patient is currently on statin therapy. I have asked the patient to follow-up with their PCP regarding any incidental finding of coronary artery disease and management with diet or medication as their PCP  feels is clinically indicated. The patient verbalized understanding of the above and had no further questions upon completion of the visit.      Magdalen Spatz, NP 04/12/2021

## 2021-04-12 NOTE — Telephone Encounter (Signed)
[place]ist with [facility] requesting for verbal order for patient to do Physical Therapy twice a week for 4 weeks. Dinika also would like to report that patient had a fall yesterday. Dinika stated patient was walking in the room trying to turn on the light and fell. Dinika stated patient has a small puncture on her left lower leg. Dinika can be reached at [phone]. Forward to Dr. Madilyn Fireman for review.

## 2021-04-12 NOTE — Assessment & Plan Note (Signed)
She is okay to restart her blood thinner at this point.  She has been holding it since she fell and hit her head and experienced a's very small subdural hematoma.

## 2021-04-12 NOTE — Telephone Encounter (Signed)
Okay for verbal order

## 2021-04-12 NOTE — Assessment & Plan Note (Signed)
Blood pressure looks really good today.  I am a little concerned that she may be confused about her medication list (updated 1 today.  So I do want her to let me know if she is taking things differently.

## 2021-04-12 NOTE — Patient Instructions (Signed)
Thank you for participating in the [facility]. °It was our pleasure to meet you today. °We will call you with the results of your scan within the next few days. °Your scan will be assigned a Lung RADS category score by the physicians reading the scans.  °This Lung RADS score determines follow up scanning.  °See below for description of categories, and follow up screening recommendations. °We will be in touch to schedule your follow up screening annually or based on recommendations of our providers. °We will fax a copy of your scan results to your Primary Care Physician, or the physician who referred you to the program, to ensure they have the results. °Please call the office if you have any questions or concerns regarding your scanning experience or results.  °Our office number is [phone]. °Please speak with Denise Phelps, RN. She is our Lung Cancer Screening RN. °If she is unavailable when you call, please have the office staff send her a message. She will return your call at her earliest convenience. °Remember, if your scan is normal, we will scan you annually as long as you continue to meet the criteria for the program. (Age 55-77, Current smoker or smoker who has quit within the last 15 years). °If you are a smoker, remember, quitting is the single most powerful action that you can take to decrease your risk of lung cancer and other pulmonary, breathing related problems. °We know quitting is hard, and we are here to help.  °Please let us know if there is anything we can do to help you meet your goal of quitting. °If you are a former smoker, congratulations. We are proud of you! Remain smoke free! °Remember you can refer friends or family members through the number above.  °We will screen them to make sure they meet criteria for the program. °Thank you for helping us take better care of you by participating in Lung Screening. ° °You can receive free nicotine replacement therapy  ( patches, gum or mints) by calling 1-800-QUIT NOW. Please call so we can get you on the path to becoming  a non-smoker. I know it is hard, but you can do this! ° °Lung RADS Categories: ° °Lung RADS 1: no nodules or definitely non-concerning nodules.  °Recommendation is for a repeat annual scan in 12 months. ° °Lung RADS 2:  nodules that are non-concerning in appearance and behavior with a very low likelihood of becoming an active cancer. °Recommendation is for a repeat annual scan in 12 months. ° °Lung RADS 3: nodules that are probably non-concerning , includes nodules with a low likelihood of becoming an active cancer.  Recommendation is for a 6-month repeat screening scan. Often noted after an upper respiratory illness. We will be in touch to make sure you have no questions, and to schedule your 6-month scan. ° °Lung RADS 4 A: nodules with concerning findings, recommendation is most often for a follow up scan in 3 months or additional testing based on our provider's assessment of the scan. We will be in touch to make sure you have no questions and to schedule the recommended 3 month follow up scan. ° °Lung RADS 4 B:  indicates findings that are concerning. We will be in touch with you to schedule additional diagnostic testing based on our provider's  assessment of the scan. ° °Hypnosis for smoking cessation  °[employer] °[phone] ° °Acupuncture for smoking cessation  °[facility] °[phone]  °

## 2021-04-12 NOTE — Assessment & Plan Note (Signed)
Once they were able to remove all the excess fluid she has been breathing much better and has not needed her oxygen.

## 2021-04-12 NOTE — Telephone Encounter (Signed)
Please call patient and encouraged her to restart her blood thinner, Xarelto for her atrial fibrillation.  At some point if they are able to schedule cardioversion in the future then she may be able to come off of it.  But for now she does need to be on it.

## 2021-04-12 NOTE — Assessment & Plan Note (Addendum)
They added Farxiga at the hospital.  We will hopefully she will be able to get this covered.  Also discussed that on her home scale her dry weight is around 190 pounds.  So if at any point her weight goes up more than 3 pounds from this and she needs to take an extra furosemide she will need to take 2 instead of 1.

## 2021-04-12 NOTE — Telephone Encounter (Signed)
Attempted to contact patient. Unable to leave message/No voicemail.

## 2021-04-13 ENCOUNTER — Ambulatory Visit (INDEPENDENT_AMBULATORY_CARE_PROVIDER_SITE_OTHER): Payer: Medicare Other

## 2021-04-13 DIAGNOSIS — Z122 Encounter for screening for malignant neoplasm of respiratory organs: Secondary | ICD-10-CM

## 2021-04-13 DIAGNOSIS — F17210 Nicotine dependence, cigarettes, uncomplicated: Secondary | ICD-10-CM | POA: Diagnosis not present

## 2021-04-13 DIAGNOSIS — Z87891 Personal history of nicotine dependence: Secondary | ICD-10-CM

## 2021-04-14 DIAGNOSIS — J9620 Acute and chronic respiratory failure, unspecified whether with hypoxia or hypercapnia: Secondary | ICD-10-CM | POA: Diagnosis not present

## 2021-04-14 DIAGNOSIS — I5043 Acute on chronic combined systolic (congestive) and diastolic (congestive) heart failure: Secondary | ICD-10-CM | POA: Diagnosis not present

## 2021-04-14 DIAGNOSIS — I110 Hypertensive heart disease with heart failure: Secondary | ICD-10-CM | POA: Diagnosis not present

## 2021-04-14 DIAGNOSIS — J441 Chronic obstructive pulmonary disease with (acute) exacerbation: Secondary | ICD-10-CM | POA: Diagnosis not present

## 2021-04-14 DIAGNOSIS — I4891 Unspecified atrial fibrillation: Secondary | ICD-10-CM | POA: Diagnosis not present

## 2021-04-14 DIAGNOSIS — F17200 Nicotine dependence, unspecified, uncomplicated: Secondary | ICD-10-CM | POA: Diagnosis not present

## 2021-04-14 NOTE — Telephone Encounter (Signed)
Left detailed message/verbal order on [facility] voicemail.

## 2021-04-14 NOTE — Telephone Encounter (Signed)
Patient's husband advised of message and verbalized understanding.

## 2021-04-15 DIAGNOSIS — J441 Chronic obstructive pulmonary disease with (acute) exacerbation: Secondary | ICD-10-CM | POA: Diagnosis not present

## 2021-04-15 DIAGNOSIS — F17200 Nicotine dependence, unspecified, uncomplicated: Secondary | ICD-10-CM | POA: Diagnosis not present

## 2021-04-15 DIAGNOSIS — I110 Hypertensive heart disease with heart failure: Secondary | ICD-10-CM | POA: Diagnosis not present

## 2021-04-15 DIAGNOSIS — J9620 Acute and chronic respiratory failure, unspecified whether with hypoxia or hypercapnia: Secondary | ICD-10-CM | POA: Diagnosis not present

## 2021-04-15 DIAGNOSIS — I4891 Unspecified atrial fibrillation: Secondary | ICD-10-CM | POA: Diagnosis not present

## 2021-04-15 DIAGNOSIS — I5043 Acute on chronic combined systolic (congestive) and diastolic (congestive) heart failure: Secondary | ICD-10-CM | POA: Diagnosis not present

## 2021-04-19 DIAGNOSIS — I5043 Acute on chronic combined systolic (congestive) and diastolic (congestive) heart failure: Secondary | ICD-10-CM | POA: Diagnosis not present

## 2021-04-19 DIAGNOSIS — I4891 Unspecified atrial fibrillation: Secondary | ICD-10-CM | POA: Diagnosis not present

## 2021-04-19 DIAGNOSIS — J441 Chronic obstructive pulmonary disease with (acute) exacerbation: Secondary | ICD-10-CM | POA: Diagnosis not present

## 2021-04-19 DIAGNOSIS — J9620 Acute and chronic respiratory failure, unspecified whether with hypoxia or hypercapnia: Secondary | ICD-10-CM | POA: Diagnosis not present

## 2021-04-19 DIAGNOSIS — F17200 Nicotine dependence, unspecified, uncomplicated: Secondary | ICD-10-CM | POA: Diagnosis not present

## 2021-04-19 DIAGNOSIS — I110 Hypertensive heart disease with heart failure: Secondary | ICD-10-CM | POA: Diagnosis not present

## 2021-04-21 DIAGNOSIS — I110 Hypertensive heart disease with heart failure: Secondary | ICD-10-CM | POA: Diagnosis not present

## 2021-04-21 DIAGNOSIS — J9620 Acute and chronic respiratory failure, unspecified whether with hypoxia or hypercapnia: Secondary | ICD-10-CM | POA: Diagnosis not present

## 2021-04-21 DIAGNOSIS — I5043 Acute on chronic combined systolic (congestive) and diastolic (congestive) heart failure: Secondary | ICD-10-CM | POA: Diagnosis not present

## 2021-04-21 DIAGNOSIS — J441 Chronic obstructive pulmonary disease with (acute) exacerbation: Secondary | ICD-10-CM | POA: Diagnosis not present

## 2021-04-21 DIAGNOSIS — F17200 Nicotine dependence, unspecified, uncomplicated: Secondary | ICD-10-CM | POA: Diagnosis not present

## 2021-04-21 DIAGNOSIS — I4891 Unspecified atrial fibrillation: Secondary | ICD-10-CM | POA: Diagnosis not present

## 2021-04-26 DIAGNOSIS — I5043 Acute on chronic combined systolic (congestive) and diastolic (congestive) heart failure: Secondary | ICD-10-CM | POA: Diagnosis not present

## 2021-04-26 DIAGNOSIS — F17200 Nicotine dependence, unspecified, uncomplicated: Secondary | ICD-10-CM | POA: Diagnosis not present

## 2021-04-26 DIAGNOSIS — I110 Hypertensive heart disease with heart failure: Secondary | ICD-10-CM | POA: Diagnosis not present

## 2021-04-26 DIAGNOSIS — J9620 Acute and chronic respiratory failure, unspecified whether with hypoxia or hypercapnia: Secondary | ICD-10-CM | POA: Diagnosis not present

## 2021-04-26 DIAGNOSIS — I4891 Unspecified atrial fibrillation: Secondary | ICD-10-CM | POA: Diagnosis not present

## 2021-04-26 DIAGNOSIS — J441 Chronic obstructive pulmonary disease with (acute) exacerbation: Secondary | ICD-10-CM | POA: Diagnosis not present

## 2021-04-29 ENCOUNTER — Encounter: Payer: Self-pay | Admitting: Neurology

## 2021-04-29 DIAGNOSIS — I5043 Acute on chronic combined systolic (congestive) and diastolic (congestive) heart failure: Secondary | ICD-10-CM | POA: Diagnosis not present

## 2021-04-29 DIAGNOSIS — J441 Chronic obstructive pulmonary disease with (acute) exacerbation: Secondary | ICD-10-CM | POA: Diagnosis not present

## 2021-04-29 DIAGNOSIS — J9620 Acute and chronic respiratory failure, unspecified whether with hypoxia or hypercapnia: Secondary | ICD-10-CM | POA: Diagnosis not present

## 2021-04-29 DIAGNOSIS — I110 Hypertensive heart disease with heart failure: Secondary | ICD-10-CM | POA: Diagnosis not present

## 2021-04-29 DIAGNOSIS — F17200 Nicotine dependence, unspecified, uncomplicated: Secondary | ICD-10-CM | POA: Diagnosis not present

## 2021-04-29 DIAGNOSIS — I4891 Unspecified atrial fibrillation: Secondary | ICD-10-CM | POA: Diagnosis not present

## 2021-04-29 NOTE — Progress Notes (Signed)
Med list updated/ jm

## 2021-04-29 NOTE — Telephone Encounter (Signed)
This encounter was created in error - please disregard.

## 2021-04-30 ENCOUNTER — Telehealth: Payer: Self-pay | Admitting: Acute Care

## 2021-04-30 DIAGNOSIS — J9620 Acute and chronic respiratory failure, unspecified whether with hypoxia or hypercapnia: Secondary | ICD-10-CM | POA: Diagnosis not present

## 2021-04-30 DIAGNOSIS — Z87891 Personal history of nicotine dependence: Secondary | ICD-10-CM

## 2021-04-30 DIAGNOSIS — I110 Hypertensive heart disease with heart failure: Secondary | ICD-10-CM | POA: Diagnosis not present

## 2021-04-30 DIAGNOSIS — I4891 Unspecified atrial fibrillation: Secondary | ICD-10-CM | POA: Diagnosis not present

## 2021-04-30 DIAGNOSIS — F17210 Nicotine dependence, cigarettes, uncomplicated: Secondary | ICD-10-CM

## 2021-04-30 DIAGNOSIS — F17200 Nicotine dependence, unspecified, uncomplicated: Secondary | ICD-10-CM | POA: Diagnosis not present

## 2021-04-30 DIAGNOSIS — I5043 Acute on chronic combined systolic (congestive) and diastolic (congestive) heart failure: Secondary | ICD-10-CM | POA: Diagnosis not present

## 2021-04-30 DIAGNOSIS — J441 Chronic obstructive pulmonary disease with (acute) exacerbation: Secondary | ICD-10-CM | POA: Diagnosis not present

## 2021-04-30 NOTE — Telephone Encounter (Signed)
I have attempted to call the patient with the results of her low dose Ct Chest. There was no answer at either her home or cell number. Langley Gauss or [place], If you can let her know her scan was a Lung RADS 1, negative study: no nodules or definitely benign nodules. Radiology recommendation is for a repeat LDCT in 12 months.  However, there is notation of pleural effusions and several other indications of CHF. Please have her follow up with whoever is managing her heart failure. Have her seek emergency care if she is having difficulty breathing with sats < 88%.  12 month low dose follow up, and fax results to PCP. Thanks so much

## 2021-05-03 DIAGNOSIS — M25561 Pain in right knee: Secondary | ICD-10-CM | POA: Diagnosis not present

## 2021-05-03 DIAGNOSIS — M25562 Pain in left knee: Secondary | ICD-10-CM | POA: Diagnosis not present

## 2021-05-04 DIAGNOSIS — F17200 Nicotine dependence, unspecified, uncomplicated: Secondary | ICD-10-CM | POA: Diagnosis not present

## 2021-05-04 DIAGNOSIS — J441 Chronic obstructive pulmonary disease with (acute) exacerbation: Secondary | ICD-10-CM | POA: Diagnosis not present

## 2021-05-04 DIAGNOSIS — I110 Hypertensive heart disease with heart failure: Secondary | ICD-10-CM | POA: Diagnosis not present

## 2021-05-04 DIAGNOSIS — J9620 Acute and chronic respiratory failure, unspecified whether with hypoxia or hypercapnia: Secondary | ICD-10-CM | POA: Diagnosis not present

## 2021-05-04 DIAGNOSIS — I4891 Unspecified atrial fibrillation: Secondary | ICD-10-CM | POA: Diagnosis not present

## 2021-05-04 DIAGNOSIS — I5043 Acute on chronic combined systolic (congestive) and diastolic (congestive) heart failure: Secondary | ICD-10-CM | POA: Diagnosis not present

## 2021-05-07 ENCOUNTER — Other Ambulatory Visit: Payer: Self-pay | Admitting: Family Medicine

## 2021-05-07 DIAGNOSIS — I509 Heart failure, unspecified: Secondary | ICD-10-CM

## 2021-05-07 DIAGNOSIS — I10 Essential (primary) hypertension: Secondary | ICD-10-CM

## 2021-05-08 ENCOUNTER — Other Ambulatory Visit: Payer: Self-pay | Admitting: Family Medicine

## 2021-05-08 DIAGNOSIS — I10 Essential (primary) hypertension: Secondary | ICD-10-CM

## 2021-05-10 DIAGNOSIS — I110 Hypertensive heart disease with heart failure: Secondary | ICD-10-CM | POA: Diagnosis not present

## 2021-05-10 DIAGNOSIS — F17200 Nicotine dependence, unspecified, uncomplicated: Secondary | ICD-10-CM | POA: Diagnosis not present

## 2021-05-10 DIAGNOSIS — J441 Chronic obstructive pulmonary disease with (acute) exacerbation: Secondary | ICD-10-CM | POA: Diagnosis not present

## 2021-05-10 DIAGNOSIS — J9620 Acute and chronic respiratory failure, unspecified whether with hypoxia or hypercapnia: Secondary | ICD-10-CM | POA: Diagnosis not present

## 2021-05-10 DIAGNOSIS — I5043 Acute on chronic combined systolic (congestive) and diastolic (congestive) heart failure: Secondary | ICD-10-CM | POA: Diagnosis not present

## 2021-05-10 DIAGNOSIS — I4891 Unspecified atrial fibrillation: Secondary | ICD-10-CM | POA: Diagnosis not present

## 2021-05-10 NOTE — Telephone Encounter (Signed)
Spoke with pt and advised of chest CT results per Eric Form, NP. Pt will continue to f/u with Dr Madilyn Fireman regarding additional findings. Copy of CT sent to Dr Madilyn Fireman. Order placed for 12 month low dose CT f/u scan. PT verbalized understanding.

## 2021-05-10 NOTE — Addendum Note (Signed)
Addended by: Doroteo Glassman D on: 05/10/2021 11:21 AM   Modules accepted: Orders

## 2021-05-12 DIAGNOSIS — I4891 Unspecified atrial fibrillation: Secondary | ICD-10-CM | POA: Diagnosis not present

## 2021-05-12 DIAGNOSIS — E6609 Other obesity due to excess calories: Secondary | ICD-10-CM | POA: Diagnosis not present

## 2021-05-12 DIAGNOSIS — I110 Hypertensive heart disease with heart failure: Secondary | ICD-10-CM | POA: Diagnosis not present

## 2021-05-12 DIAGNOSIS — M6281 Muscle weakness (generalized): Secondary | ICD-10-CM | POA: Diagnosis not present

## 2021-05-12 DIAGNOSIS — Z6826 Body mass index (BMI) 26.0-26.9, adult: Secondary | ICD-10-CM | POA: Diagnosis not present

## 2021-05-12 DIAGNOSIS — D649 Anemia, unspecified: Secondary | ICD-10-CM | POA: Diagnosis not present

## 2021-05-12 DIAGNOSIS — J441 Chronic obstructive pulmonary disease with (acute) exacerbation: Secondary | ICD-10-CM | POA: Diagnosis not present

## 2021-05-12 DIAGNOSIS — I428 Other cardiomyopathies: Secondary | ICD-10-CM | POA: Diagnosis not present

## 2021-05-12 DIAGNOSIS — I4811 Longstanding persistent atrial fibrillation: Secondary | ICD-10-CM | POA: Diagnosis not present

## 2021-05-12 DIAGNOSIS — F17200 Nicotine dependence, unspecified, uncomplicated: Secondary | ICD-10-CM | POA: Diagnosis not present

## 2021-05-12 DIAGNOSIS — J9620 Acute and chronic respiratory failure, unspecified whether with hypoxia or hypercapnia: Secondary | ICD-10-CM | POA: Diagnosis not present

## 2021-05-12 DIAGNOSIS — E785 Hyperlipidemia, unspecified: Secondary | ICD-10-CM | POA: Diagnosis not present

## 2021-05-12 DIAGNOSIS — I483 Typical atrial flutter: Secondary | ICD-10-CM | POA: Diagnosis not present

## 2021-05-12 DIAGNOSIS — Z7901 Long term (current) use of anticoagulants: Secondary | ICD-10-CM | POA: Diagnosis not present

## 2021-05-12 DIAGNOSIS — I10 Essential (primary) hypertension: Secondary | ICD-10-CM | POA: Diagnosis not present

## 2021-05-12 DIAGNOSIS — Z87440 Personal history of urinary (tract) infections: Secondary | ICD-10-CM | POA: Diagnosis not present

## 2021-05-12 DIAGNOSIS — R296 Repeated falls: Secondary | ICD-10-CM | POA: Diagnosis not present

## 2021-05-12 DIAGNOSIS — K5909 Other constipation: Secondary | ICD-10-CM | POA: Diagnosis not present

## 2021-05-12 DIAGNOSIS — M1612 Unilateral primary osteoarthritis, left hip: Secondary | ICD-10-CM | POA: Diagnosis not present

## 2021-05-12 DIAGNOSIS — I5043 Acute on chronic combined systolic (congestive) and diastolic (congestive) heart failure: Secondary | ICD-10-CM | POA: Diagnosis not present

## 2021-05-13 DIAGNOSIS — I4891 Unspecified atrial fibrillation: Secondary | ICD-10-CM | POA: Diagnosis not present

## 2021-05-13 DIAGNOSIS — F17200 Nicotine dependence, unspecified, uncomplicated: Secondary | ICD-10-CM | POA: Diagnosis not present

## 2021-05-13 DIAGNOSIS — J441 Chronic obstructive pulmonary disease with (acute) exacerbation: Secondary | ICD-10-CM | POA: Diagnosis not present

## 2021-05-13 DIAGNOSIS — I110 Hypertensive heart disease with heart failure: Secondary | ICD-10-CM | POA: Diagnosis not present

## 2021-05-13 DIAGNOSIS — J9620 Acute and chronic respiratory failure, unspecified whether with hypoxia or hypercapnia: Secondary | ICD-10-CM | POA: Diagnosis not present

## 2021-05-13 DIAGNOSIS — I5043 Acute on chronic combined systolic (congestive) and diastolic (congestive) heart failure: Secondary | ICD-10-CM | POA: Diagnosis not present

## 2021-05-14 DIAGNOSIS — I5043 Acute on chronic combined systolic (congestive) and diastolic (congestive) heart failure: Secondary | ICD-10-CM | POA: Diagnosis not present

## 2021-05-14 DIAGNOSIS — I110 Hypertensive heart disease with heart failure: Secondary | ICD-10-CM | POA: Diagnosis not present

## 2021-05-18 ENCOUNTER — Ambulatory Visit (INDEPENDENT_AMBULATORY_CARE_PROVIDER_SITE_OTHER): Payer: Medicare Other | Admitting: Family Medicine

## 2021-05-18 ENCOUNTER — Other Ambulatory Visit: Payer: Self-pay

## 2021-05-18 VITALS — BP 109/61 | HR 68 | Resp 18 | Ht 63.0 in | Wt 183.0 lb

## 2021-05-18 DIAGNOSIS — G8929 Other chronic pain: Secondary | ICD-10-CM | POA: Diagnosis not present

## 2021-05-18 DIAGNOSIS — D649 Anemia, unspecified: Secondary | ICD-10-CM

## 2021-05-18 DIAGNOSIS — I5023 Acute on chronic systolic (congestive) heart failure: Secondary | ICD-10-CM

## 2021-05-18 DIAGNOSIS — Z720 Tobacco use: Secondary | ICD-10-CM

## 2021-05-18 DIAGNOSIS — I10 Essential (primary) hypertension: Secondary | ICD-10-CM

## 2021-05-18 DIAGNOSIS — I4891 Unspecified atrial fibrillation: Secondary | ICD-10-CM

## 2021-05-18 MED ORDER — HYDROCODONE-ACETAMINOPHEN 10-325 MG PO TABS: 1.0000 | ORAL_TABLET | Freq: Three times a day (TID) | ORAL | 0 refills | Status: DC | PRN

## 2021-05-18 NOTE — Assessment & Plan Note (Signed)
No sign of volume overload.  Weight is stable from her last visit which is fantastic she is continuing to weigh daily.  She will let me know if there is any significant increase.  She does take furosemide 40 mg daily. ?

## 2021-05-18 NOTE — Assessment & Plan Note (Signed)
They are considering a Watchman device which I think would actually be fantastic and would allow her to eventually come off of the Xarelto and reduce her risk for high risk bleeding from falls. ?

## 2021-05-18 NOTE — Assessment & Plan Note (Addendum)
Hydrocodone 7.5/325 1 po TID #90/mo ?History of lumbar laminectomy with decompression and microdiscectomy December 2017. ?Additional medications: Piroxicam, Robaxin, Cymbalta ?Last UDS date:?05/18/2021?- monitor for alcohol? ?Opioid Treatment Agreement signed (Y/N):05/18/2021 ?Opioid Treatment Agreement last reviewed with patient: ?05/18/2021 ?NCCSRS reviewed this encounter (include red flags):?Yes ?

## 2021-05-18 NOTE — Assessment & Plan Note (Addendum)
Well-controlled.  Continue current regimen. 

## 2021-05-18 NOTE — Progress Notes (Signed)
° °Established Patient Office Visit ° °Subjective:  °Patient ID: Kelly Roth, female    DOB: 05/08/1950  Age: 71 y.o. MRN: [id number] ° °CC:  °Chief Complaint  °Patient presents with  ° Pain Management  ° ° °HPI °Kelly Roth presents for  ° °Follow-up heart failure-overall she feels like she is doing well in fact she says she just had a follow-up with cardiology on March 1.  He has longstanding persistent atrial fibrillation.  They discussed with her a watchman implantation and so she is going to go in for an assessment potentially do that.  She did have a subdural hematoma after a fall on the Xarelto.  She is back on her Xarelto and is getting a lot of bruising on her arms and legs. ° °Tob abuse-she continues to smoke she said she has cut back to about a half a pack per day.  She denies any recent chest pain or increasing shortness of breath or swelling. ° °Follow-up chronic pain management-history of lumbar laminectomy with decompression and microdiscectomy in 2017 with continued persistent pain.  She is currently on Robaxin, Cymbalta and piroxicam as well as hydrocodone 10/325.  Reports that overall she feels like her pain is well controlled.  She does struggle occasionally with some constipation and will occasionally take an Ex-Lax if needed. ° ° °Past Medical History:  °Diagnosis Date  ° Adenomatous polyps 2004  °  Colonoscopy done in [place]  ° Arthritis   ° Back pain   ° Complication of anesthesia   ° COPD (chronic obstructive pulmonary disease) (HCC)   ° Dyspnea   ° pain contributes but also pt. reveals that she has been out of her Spiriva for one month +  ° GERD (gastroesophageal reflux disease)   ° Hemorrhoids   ° Hyperlipidemia   ° Hypertension   ° Osteoarthritis   ° PONV (postoperative nausea and vomiting)   ° scopolamine patch used with success with a prev. surg.   ° ° °Past Surgical History:  °Procedure Laterality Date  ° CATARACT EXTRACTION, BILATERAL Bilateral 04/2020  °  CHOLECYSTECTOMY  05/08/2007  ° JOINT REPLACEMENT Bilateral   ° left knee replacement  05/2005  ° right on 2-06  ° LUMBAR LAMINECTOMY/DECOMPRESSION MICRODISCECTOMY N/A 03/01/2016  ° Procedure: LEFT AND CENTRAL L3-4 AND LEFT L4-5 DECOMPRESSIVE LUMBAR LAMINECTOMY FOR SPINAL STENOSIS;  Surgeon: James E Nitka, MD;  Location: [facility] OR;  Service: Orthopedics;  Laterality: N/A;  ° MULTIPLE TOOTH EXTRACTIONS    ° ° °Family History  °Problem Relation Age of Onset  ° Depression Mother   ° Hypertension Mother   ° Hyperlipidemia Mother   ° COPD Mother   ° Lung cancer Mother   ° Hyperlipidemia Father   ° Hypertension Father   ° Breast cancer Maternal Grandmother   ° Colon cancer Neg Hx   °     Neg   ° ° °Social History  ° °Socioeconomic History  ° Marital status: Married  °  Spouse name: [name]  ° Number of children: 0  ° Years of education: 12  ° Highest education level: 12th grade  °Occupational History  ° Occupation: Caregiver  ° Occupation: Retired.  °Tobacco Use  ° Smoking status: Every Day  °  Packs/day: 1.00  °  Years: 51.00  °  Pack years: 51.00  °  Types: Cigarettes  ° Smokeless tobacco: Never  ° Tobacco comments:  °  1 pack a day   °Vaping Use  ° Vaping   Vaping Use: Never used  Substance and Sexual Activity   Alcohol use: Yes    Alcohol/week: 0.0 standard drinks    Comment: one drink a week    Drug use: No   Sexual activity: Not on file  Other Topics Concern   Not on file  Social History Narrative   Lives with her husband. She enjoys reading and adult coloring.    Social Determinants of Health   Financial Resource Strain: Low Risk    Difficulty of Paying Living Expenses: Not hard at all  Food Insecurity: No Food Insecurity   Worried About Charity fundraiser in the Last Year: Never true   [place] in the Last Year: Never true  Transportation Needs: No Transportation Needs   Lack of Transportation (Medical): No   Lack of Transportation (Non-Medical): No  Physical Activity: Inactive   Days of  Exercise per Week: 0 days   Minutes of Exercise per Session: 0 min  Stress: No Stress Concern Present   Feeling of Stress : Not at all  Social Connections: Socially Isolated   Frequency of Communication with Friends and Family: Once a week   Frequency of Social Gatherings with Friends and Family: Once a week   Attends Religious Services: Never   Marine scientist or Organizations: No   Attends Music therapist: Never   Marital Status: Married  Human resources officer Violence: Not At Risk   Fear of Current or Ex-Partner: No   Emotionally Abused: No   Physically Abused: No   Sexually Abused: No    Outpatient Medications Prior to Visit  Medication Sig Dispense Refill   albuterol (VENTOLIN HFA) 108 (90 Base) MCG/ACT inhaler Inhale 1-2 puffs into the lungs every 6 (six) hours as needed for wheezing or shortness of breath. 18 each 1   dapagliflozin propanediol (FARXIGA) 5 MG TABS tablet Take 1 tablet (5 mg total) by mouth daily before breakfast. 90 tablet 1   DULoxetine (CYMBALTA) 60 MG capsule TAKE 1 CAPSULE BY MOUTH EVERY DAY 90 capsule 1   Fluticasone-Umeclidin-Vilant (TRELEGY ELLIPTA) 100-62.5-25 MCG/ACT AEPB Inhale 1 puff into the lungs daily. 3 each 3   furosemide (LASIX) 40 MG tablet Take 1 tablet (40 mg total) by mouth daily. OK to take 2 tabs if weight is > 193 lbs 1 tablet 0   HYDROcodone-acetaminophen (NORCO) 10-325 MG tablet Take 1 tablet by mouth every 8 (eight) hours as needed. 90 tablet 0   lisinopril (ZESTRIL) 5 MG tablet TAKE 1 TABLET (5 MG TOTAL) BY MOUTH DAILY. 90 tablet 0   metoprolol succinate (TOPROL-XL) 50 MG 24 hr tablet Take 25 mg by mouth daily. Take with or immediately following a meal.     Multiple Vitamin (MULTIVITAMIN) capsule Take 1 capsule by mouth daily.       Omega-3 Fatty Acids (FISH OIL) 1000 MG CAPS Take by mouth.     pantoprazole (PROTONIX) 40 MG tablet Take 40 mg by mouth daily.     rosuvastatin (CRESTOR) 40 MG tablet Take 1 tablet (40 mg  total) by mouth at bedtime. 90 tablet 3   HYDROcodone-acetaminophen (NORCO) 10-325 MG tablet Take 1 tablet by mouth every 8 (eight) hours as needed. 90 tablet 0   HYDROcodone-acetaminophen (NORCO) 10-325 MG tablet Take 1 tablet by mouth every 8 (eight) hours as needed. 90 tablet 0   No facility-administered medications prior to visit.    Allergies  Allergen Reactions   Nsaids Other (See Comments)  ROS Review of Systems    Objective:    Physical Exam Constitutional:      Appearance: Normal appearance. She is well-developed.  HENT:     Head: Normocephalic and atraumatic.  Cardiovascular:     Rate and Rhythm: Normal rate and regular rhythm.     Heart sounds: Normal heart sounds.  Pulmonary:     Effort: Pulmonary effort is normal.     Breath sounds: Normal breath sounds.  Musculoskeletal:     Comments: Trace ankle edema bilaterally.  Skin:    General: Skin is warm and dry.  Neurological:     Mental Status: She is alert and oriented to person, place, and time.  Psychiatric:        Behavior: Behavior normal.    BP 109/61    Pulse 68    Resp 18    Ht 5' 3" (1.6 m)    Wt 183 lb (83 kg)    SpO2 95%    BMI 32.42 kg/m  Wt Readings from Last 3 Encounters:  05/18/21 183 lb (83 kg)  04/12/21 189 lb (85.7 kg)  02/18/21 219 lb (99.3 kg)     Health Maintenance Due  Topic Date Due   Pneumonia Vaccine 76+ Years old (2 - PCV) 05/14/2019   COVID-19 Vaccine (5 - Booster for Pfizer series) 01/18/2021    There are no preventive care reminders to display for this patient.  Lab Results  Component Value Date   TSH 1.75 08/22/2018   Lab Results  Component Value Date   WBC 9.1 07/20/2020   HGB 13.1 07/20/2020   HCT 42.8 07/20/2020   MCV 94.9 07/20/2020   PLT 127 (L) 07/20/2020   Lab Results  Component Value Date   NA 141 02/18/2021   K 3.7 02/18/2021   CO2 34 (H) 02/18/2021   GLUCOSE 122 (H) 02/18/2021   BUN 14 02/18/2021   CREATININE 0.76 02/18/2021   BILITOT 1.9 (H)  12/04/2020   ALKPHOS 95 08/22/2016   AST 15 12/04/2020   ALT 22 12/04/2020   PROT 6.2 12/04/2020   ALBUMIN 4.3 08/22/2016   CALCIUM 10.2 02/18/2021   ANIONGAP 7 03/02/2016   EGFR 86 02/18/2021   Lab Results  Component Value Date   CHOL 98 12/04/2020   Lab Results  Component Value Date   HDL 41 (L) 12/04/2020   Lab Results  Component Value Date   LDLCALC 41 12/04/2020   Lab Results  Component Value Date   TRIG 82 12/04/2020   Lab Results  Component Value Date   CHOLHDL 2.4 12/04/2020   Lab Results  Component Value Date   HGBA1C 5.8 (A) 02/18/2021      Assessment & Plan:   Problem List Items Addressed This Visit       Cardiovascular and Mediastinum   HYPERTENSION, BENIGN    Well controlled.  Continue current regimen.        Atrial fibrillation ([place])    They are considering a Watchman device which I think would actually be fantastic and would allow her to eventually come off of the Xarelto and reduce her risk for high risk bleeding from falls.      Acute on chronic systolic heart failure (HCC)    No sign of volume overload.  Weight is stable from her last visit which is fantastic she is continuing to weigh daily.  She will let me know if there is any significant increase.  She does take furosemide 40 mg daily.  Other   Tobacco abuse   Encounter for chronic pain management - Primary    Hydrocodone 7.5/325 1 po TID #90/mo History of lumbar laminectomy with decompression and microdiscectomy December 2017. Additional medications: Piroxicam, Robaxin, Cymbalta Last UDS date: 05/18/2021 - monitor for alcohol  Opioid Treatment Agreement signed (Y/N):05/18/2021 Opioid Treatment Agreement last reviewed with patient:  05/18/2021 NCCSRS reviewed this encounter (include red flags): Yes      Relevant Medications   HYDROcodone-acetaminophen (NORCO) 10-325 MG tablet (Start on 07/14/2021)   HYDROcodone-acetaminophen (NORCO) 10-325 MG tablet (Start on 06/15/2021)   Other  Relevant Orders   DRUG MONITORING, PANEL 6 WITH CONFIRMATION, URINE   Other Visit Diagnoses     Anemia, unspecified type       Relevant Orders   CBC       Tobacco abuse-did encourage her to continue to work on cutting back and cessation she says she is smoking a half a pack a day.  Anemia - Last hemoglobin was around 9.4 in January so would like to recheck that today.    Meds ordered this encounter  Medications   HYDROcodone-acetaminophen (NORCO) 10-325 MG tablet    Sig: Take 1 tablet by mouth every 8 (eight) hours as needed.    Dispense:  90 tablet    Refill:  0   HYDROcodone-acetaminophen (NORCO) 10-325 MG tablet    Sig: Take 1 tablet by mouth every 8 (eight) hours as needed.    Dispense:  90 tablet    Refill:  0    Follow-up: Return in about 3 months (around 08/18/2021) for Pain management .    Beatrice Lecher, MD

## 2021-05-19 ENCOUNTER — Other Ambulatory Visit: Payer: Self-pay | Admitting: *Deleted

## 2021-05-19 DIAGNOSIS — D649 Anemia, unspecified: Secondary | ICD-10-CM

## 2021-05-19 LAB — CBC
HCT: 32.7 % — ABNORMAL LOW (ref 35.0–45.0)
Hemoglobin: 9.4 g/dL — ABNORMAL LOW (ref 11.7–15.5)
MCH: 24.7 pg — ABNORMAL LOW (ref 27.0–33.0)
MCHC: 28.7 g/dL — ABNORMAL LOW (ref 32.0–36.0)
MCV: 86.1 fL (ref 80.0–100.0)
MPV: 12 fL (ref 7.5–12.5)
Platelets: 94 10*3/uL — ABNORMAL LOW (ref 140–400)
RBC: 3.8 10*6/uL (ref 3.80–5.10)
RDW: 16.1 % — ABNORMAL HIGH (ref 11.0–15.0)
WBC: 5.5 10*3/uL (ref 3.8–10.8)

## 2021-05-19 NOTE — Progress Notes (Signed)
Hi Kelly Roth, your hemoglobin is still low similar to what it was when you left the hospital.  So its not really going up but I guess it stable.  But I am most concerned because your platelets are low at 94,000.  They looked better during the hospital admission and they are actually much lower.  They should actually be between 140,000 and 400,000.  Like to refer you to a hematologist which is a blood specialist we have 1 in our [facility] location which is pretty close to here if that is convenient or if you prefer to be seen in [place] or with [facility] then just let us know but I would like to place referral referral for the anemia and low platelets.

## 2021-05-20 ENCOUNTER — Telehealth: Payer: Self-pay | Admitting: *Deleted

## 2021-05-21 LAB — DRUG MONITORING, PANEL 6 WITH CONFIRMATION, URINE
6 Acetylmorphine: NEGATIVE ng/mL (ref <10)
Alcohol Metabolites: NEGATIVE ng/mL (ref <500)
Amphetamines: NEGATIVE ng/mL (ref <500)
Barbiturates: NEGATIVE ng/mL (ref <300)
Benzodiazepines: NEGATIVE ng/mL (ref <100)
Cocaine Metabolite: NEGATIVE ng/mL (ref <150)
Codeine: NEGATIVE ng/mL (ref <50)
Creatinine: 61.4 mg/dL (ref >=20.0)
Hydrocodone: 955 ng/mL — ABNORMAL HIGH (ref <50)
Hydromorphone: 185 ng/mL — ABNORMAL HIGH (ref <50)
Marijuana Metabolite: NEGATIVE ng/mL (ref <20)
Methadone Metabolite: NEGATIVE ng/mL (ref <100)
Morphine: NEGATIVE ng/mL (ref <50)
Norhydrocodone: 2722 ng/mL — ABNORMAL HIGH (ref <50)
Opiates: POSITIVE ng/mL — AB (ref <100)
Oxidant: NEGATIVE ug/mL (ref <200)
Oxycodone: NEGATIVE ng/mL (ref <100)
Phencyclidine: NEGATIVE ng/mL (ref <25)
pH: 6.7 (ref 4.5–9.0)

## 2021-05-21 LAB — DM TEMPLATE

## 2021-05-24 ENCOUNTER — Other Ambulatory Visit: Payer: Self-pay | Admitting: Family

## 2021-05-24 DIAGNOSIS — D649 Anemia, unspecified: Secondary | ICD-10-CM

## 2021-05-25 ENCOUNTER — Other Ambulatory Visit: Payer: Self-pay

## 2021-05-25 ENCOUNTER — Inpatient Hospital Stay (HOSPITAL_BASED_OUTPATIENT_CLINIC_OR_DEPARTMENT_OTHER): Payer: Medicare Other | Admitting: Family

## 2021-05-25 ENCOUNTER — Inpatient Hospital Stay: Payer: Medicare Other | Attending: Hematology & Oncology

## 2021-05-25 ENCOUNTER — Telehealth: Payer: Self-pay

## 2021-05-25 ENCOUNTER — Encounter: Payer: Self-pay | Admitting: Family

## 2021-05-25 VITALS — BP 104/53 | HR 74 | Temp 97.9°F | Resp 20 | Wt 185.0 lb

## 2021-05-25 DIAGNOSIS — M25552 Pain in left hip: Secondary | ICD-10-CM | POA: Insufficient documentation

## 2021-05-25 DIAGNOSIS — Z8719 Personal history of other diseases of the digestive system: Secondary | ICD-10-CM | POA: Diagnosis not present

## 2021-05-25 DIAGNOSIS — Z8349 Family history of other endocrine, nutritional and metabolic diseases: Secondary | ICD-10-CM | POA: Diagnosis not present

## 2021-05-25 DIAGNOSIS — R42 Dizziness and giddiness: Secondary | ICD-10-CM | POA: Insufficient documentation

## 2021-05-25 DIAGNOSIS — D649 Anemia, unspecified: Secondary | ICD-10-CM | POA: Diagnosis not present

## 2021-05-25 DIAGNOSIS — K5909 Other constipation: Secondary | ICD-10-CM

## 2021-05-25 DIAGNOSIS — G8929 Other chronic pain: Secondary | ICD-10-CM

## 2021-05-25 DIAGNOSIS — J449 Chronic obstructive pulmonary disease, unspecified: Secondary | ICD-10-CM | POA: Insufficient documentation

## 2021-05-25 DIAGNOSIS — F17210 Nicotine dependence, cigarettes, uncomplicated: Secondary | ICD-10-CM | POA: Insufficient documentation

## 2021-05-25 DIAGNOSIS — Z79899 Other long term (current) drug therapy: Secondary | ICD-10-CM | POA: Insufficient documentation

## 2021-05-25 DIAGNOSIS — Z818 Family history of other mental and behavioral disorders: Secondary | ICD-10-CM | POA: Diagnosis not present

## 2021-05-25 DIAGNOSIS — Z803 Family history of malignant neoplasm of breast: Secondary | ICD-10-CM

## 2021-05-25 DIAGNOSIS — I110 Hypertensive heart disease with heart failure: Secondary | ICD-10-CM | POA: Insufficient documentation

## 2021-05-25 DIAGNOSIS — Z886 Allergy status to analgesic agent status: Secondary | ICD-10-CM | POA: Diagnosis not present

## 2021-05-25 DIAGNOSIS — Z7901 Long term (current) use of anticoagulants: Secondary | ICD-10-CM | POA: Diagnosis not present

## 2021-05-25 DIAGNOSIS — K648 Other hemorrhoids: Secondary | ICD-10-CM | POA: Diagnosis not present

## 2021-05-25 DIAGNOSIS — I509 Heart failure, unspecified: Secondary | ICD-10-CM

## 2021-05-25 DIAGNOSIS — R5383 Other fatigue: Secondary | ICD-10-CM | POA: Insufficient documentation

## 2021-05-25 DIAGNOSIS — M25551 Pain in right hip: Secondary | ICD-10-CM | POA: Diagnosis not present

## 2021-05-25 DIAGNOSIS — I4891 Unspecified atrial fibrillation: Secondary | ICD-10-CM | POA: Insufficient documentation

## 2021-05-25 DIAGNOSIS — Z801 Family history of malignant neoplasm of trachea, bronchus and lung: Secondary | ICD-10-CM

## 2021-05-25 DIAGNOSIS — Z8249 Family history of ischemic heart disease and other diseases of the circulatory system: Secondary | ICD-10-CM | POA: Insufficient documentation

## 2021-05-25 DIAGNOSIS — E876 Hypokalemia: Secondary | ICD-10-CM

## 2021-05-25 DIAGNOSIS — Z9049 Acquired absence of other specified parts of digestive tract: Secondary | ICD-10-CM | POA: Diagnosis not present

## 2021-05-25 LAB — CBC WITH DIFFERENTIAL (CANCER CENTER ONLY)
Abs Immature Granulocytes: 0 10*3/uL (ref 0.00–0.07)
Basophils Absolute: 0.1 10*3/uL (ref 0.0–0.1)
Basophils Relative: 2 %
Eosinophils Absolute: 0.1 10*3/uL (ref 0.0–0.5)
Eosinophils Relative: 2 %
HCT: 30.6 % — ABNORMAL LOW (ref 36.0–46.0)
Hemoglobin: 8.9 g/dL — ABNORMAL LOW (ref 12.0–15.0)
Immature Granulocytes: 0 %
Lymphocytes Relative: 26 %
Lymphs Abs: 1.3 10*3/uL (ref 0.7–4.0)
MCH: 24.9 pg — ABNORMAL LOW (ref 26.0–34.0)
MCHC: 29.1 g/dL — ABNORMAL LOW (ref 30.0–36.0)
MCV: 85.5 fL (ref 80.0–100.0)
Monocytes Absolute: 0.6 10*3/uL (ref 0.1–1.0)
Monocytes Relative: 13 %
Neutro Abs: 2.7 10*3/uL (ref 1.7–7.7)
Neutrophils Relative %: 57 %
Platelet Count: 140 10*3/uL — ABNORMAL LOW (ref 150–400)
RBC: 3.58 MIL/uL — ABNORMAL LOW (ref 3.87–5.11)
RDW: 18.4 % — ABNORMAL HIGH (ref 11.5–15.5)
WBC Count: 4.8 10*3/uL (ref 4.0–10.5)
nRBC: 0.4 % — ABNORMAL HIGH (ref 0.0–0.2)

## 2021-05-25 LAB — CMP (CANCER CENTER ONLY)
ALT: 15 U/L (ref 0–44)
AST: 22 U/L (ref 15–41)
Albumin: 3.4 g/dL — ABNORMAL LOW (ref 3.5–5.0)
Alkaline Phosphatase: 82 U/L (ref 38–126)
Anion gap: 11 (ref 5–15)
BUN: 20 mg/dL (ref 8–23)
CO2: 35 mmol/L — ABNORMAL HIGH (ref 22–32)
Calcium: 9.3 mg/dL (ref 8.9–10.3)
Chloride: 90 mmol/L — ABNORMAL LOW (ref 98–111)
Creatinine: 0.92 mg/dL (ref 0.44–1.00)
GFR, Estimated: >60 mL/min (ref >60)
Glucose, Bld: 123 mg/dL — ABNORMAL HIGH (ref 70–99)
Potassium: 2.5 mmol/L — CL (ref 3.5–5.1)
Sodium: 136 mmol/L (ref 135–145)
Total Bilirubin: 0.9 mg/dL (ref 0.3–1.2)
Total Protein: 6.5 g/dL (ref 6.5–8.1)

## 2021-05-25 LAB — RETICULOCYTES
Immature Retic Fract: 23.7 % — ABNORMAL HIGH (ref 2.3–15.9)
RBC.: 3.6 MIL/uL — ABNORMAL LOW (ref 3.87–5.11)
Retic Count, Absolute: 78.8 10*3/uL (ref 19.0–186.0)
Retic Ct Pct: 2.2 % (ref 0.4–3.1)

## 2021-05-25 LAB — SAVE SMEAR(SSMR), FOR PROVIDER SLIDE REVIEW

## 2021-05-25 LAB — LACTATE DEHYDROGENASE: LDH: 169 U/L (ref 98–192)

## 2021-05-25 MED ORDER — POTASSIUM CHLORIDE CRYS ER 20 MEQ PO TBCR: 40.0000 meq | EXTENDED_RELEASE_TABLET | Freq: Once | ORAL | 0 refills | Status: AC

## 2021-05-25 NOTE — Progress Notes (Signed)
Hematology/Oncology Consultation  ? ?Name: Kelly Roth      MRN: [id number]    Location: Room/bed info not found  Date: 05/25/2021 Time:3:05 PM ? ? ?REFERRING PHYSICIAN: Beatrice Lecher, MD  ? ?REASON FOR CONSULT: Anemia unspecified   ?  ?DIAGNOSIS: Mild thrombocytosis and Anemia unspecified, work-up pending  ? ?HISTORY OF PRESENT ILLNESS:  Ms. Kelly Roth is a very pleasant 71 yo caucasian female with recent anemia noted with PCP.  ?Hgb today is 8.9, MCV 85, platelets 140 and WBC count 4.8.  ?No obvious blood loss noted. No petechiae.  ?She has fatigue and lightheadedness occasionally.  ?No known family history of anemia or thrombocytosis.  ?She has SOB with any exertion due to COPD.  ?She has history of atrial fib with acute on chronic heart failure and is being considered for a Watchman device. She is on Xarelto and bruises easily.  ?She is also on [place] for heart failure.  ?She states that her last fall was a month or so ago and did hit her head. She had a subdural hematoma and spent some time in a skilled nursing facility for rehab.   ?She states that she completed PT in the past and still does strengthening exercises daily.  ?Her last colonoscopy was in December 2014 with Dr. Fuller Plan. She had 2 benign polyps removed and was noted to have small internal hemorrhoids. She states that she has been referred back to GI and will be following up.  ?Mammogram in 2022 was negative.  ?She denies history of diabetes or thyroid disease.  ?No personal history of cancer. Her mother had skin and lung cancer and father had esophageal cancer.  ?She has chronic lower back and bilateral hip pain with sciatica.  ?She states that she has had both knees replaced and cholecystectomy without any complications.  ?No history of pregnancy or miscarriage.  ?No fever, chills, n/v, cough, rash, dizziness, chest pain, abdominal pain or changes in bowel or bladder habits.  ?She has chronic constipation due to her pain medication and  takes a laxative when needed.  ?No swelling, numbness or tingling in her extremities at this time.  ?She smokes 1 ppd.  ?She enjoys a little irish cream in her coffee 3 times per week. No recreational drug use.  ?Appetite recently has been decreased. She does feel that she is staying properly hydrated throughout the day. Her weight is 185 lbs.  ?Before retiring she worked for both [employer] and [employer].  ? ?ROS: All other 10 point review of systems is negative.  ? ?PAST MEDICAL HISTORY:   ?Past Medical History:  ?Diagnosis Date  ? Adenomatous polyps 2004  ?  Colonoscopy done in [facility]  ? Arthritis   ? Back pain   ? Complication of anesthesia   ? COPD (chronic obstructive pulmonary disease) ([place])   ? Dyspnea   ? pain contributes but also pt. reveals that she has been out of her Spiriva for one month +  ? GERD (gastroesophageal reflux disease)   ? Hemorrhoids   ? Hyperlipidemia   ? Hypertension   ? Osteoarthritis   ? PONV (postoperative nausea and vomiting)   ? scopolamine patch used with success with a prev. surg.   ? ? ?ALLERGIES: ?Allergies  ?Allergen Reactions  ? Nsaids Other (See Comments)  ? ?   ?MEDICATIONS:  ?Current Outpatient Medications on File Prior to Visit  ?Medication Sig Dispense Refill  ? albuterol (VENTOLIN HFA) 108 (90 Base) MCG/ACT inhaler Inhale 1-2  puffs into the lungs every 6 (six) hours as needed for wheezing or shortness of breath. 18 each 1  ? dapagliflozin propanediol (FARXIGA) 5 MG TABS tablet Take 1 tablet (5 mg total) by mouth daily before breakfast. 90 tablet 1  ? DULoxetine (CYMBALTA) 60 MG capsule TAKE 1 CAPSULE BY MOUTH EVERY DAY 90 capsule 1  ? Fluticasone-Umeclidin-Vilant (TRELEGY ELLIPTA) 100-62.5-25 MCG/ACT AEPB Inhale 1 puff into the lungs daily. 3 each 3  ? furosemide (LASIX) 40 MG tablet Take 1 tablet (40 mg total) by mouth daily. OK to take 2 tabs if weight is > 193 lbs 1 tablet 0  ? HYDROcodone-acetaminophen (NORCO) 10-325 MG tablet Take 1 tablet by mouth every 8  (eight) hours as needed. 90 tablet 0  ? [START ON 07/14/2021] HYDROcodone-acetaminophen (NORCO) 10-325 MG tablet Take 1 tablet by mouth every 8 (eight) hours as needed. 90 tablet 0  ? [START ON 06/15/2021] HYDROcodone-acetaminophen (NORCO) 10-325 MG tablet Take 1 tablet by mouth every 8 (eight) hours as needed. 90 tablet 0  ? lisinopril (ZESTRIL) 5 MG tablet TAKE 1 TABLET (5 MG TOTAL) BY MOUTH DAILY. 90 tablet 0  ? metoprolol succinate (TOPROL-XL) 50 MG 24 hr tablet Take 25 mg by mouth daily. Take with or immediately following a meal.    ? Multiple Vitamin (MULTIVITAMIN) capsule Take 1 capsule by mouth daily.      ? Omega-3 Fatty Acids (FISH OIL) 1000 MG CAPS Take by mouth.    ? pantoprazole (PROTONIX) 40 MG tablet Take 40 mg by mouth daily.    ? rosuvastatin (CRESTOR) 40 MG tablet Take 1 tablet (40 mg total) by mouth at bedtime. 90 tablet 3  ? ?No current facility-administered medications on file prior to visit.  ? ?  ?PAST SURGICAL HISTORY ?Past Surgical History:  ?Procedure Laterality Date  ? CATARACT EXTRACTION, BILATERAL Bilateral 04/2020  ? CHOLECYSTECTOMY  05/08/2007  ? JOINT REPLACEMENT Bilateral   ? left knee replacement  05/2005  ? right on 2-06  ? LUMBAR LAMINECTOMY/DECOMPRESSION MICRODISCECTOMY N/A 03/01/2016  ? Procedure: LEFT AND CENTRAL L3-4 AND LEFT L4-5 DECOMPRESSIVE LUMBAR LAMINECTOMY FOR SPINAL STENOSIS;  Surgeon: Jessy Oto, MD;  Location: [place];  Service: Orthopedics;  Laterality: N/A;  ? MULTIPLE TOOTH EXTRACTIONS    ? ? ?FAMILY HISTORY: ?Family History  ?Problem Relation Age of Onset  ? Depression Mother   ? Hypertension Mother   ? Hyperlipidemia Mother   ? COPD Mother   ? Lung cancer Mother   ? Hyperlipidemia Father   ? Hypertension Father   ? Breast cancer Maternal Grandmother   ? Colon cancer Neg Hx   ?     Neg   ? ? ?SOCIAL HISTORY: ? reports that she has been smoking cigarettes. She has a 51.00 pack-year smoking history. She has never used smokeless tobacco. She reports current alcohol  use. She reports that she does not use drugs. ? ?PERFORMANCE STATUS: ?The patient's performance status is 1 - Symptomatic but completely ambulatory ? ?PHYSICAL EXAM: ?Most Recent Vital Signs: Blood pressure (!) 104/53, pulse 74, temperature 97.9 ?F (36.6 ?C), temperature source Oral, resp. rate 20, weight 185 lb (83.9 kg), SpO2 95 %. ?BP (!) 104/53 (BP Location: Left Arm, Patient Position: Sitting)   Pulse 74   Temp 97.9 ?F (36.6 ?C) (Oral)   Resp 20   Wt 185 lb (83.9 kg)   SpO2 95%   BMI 32.77 kg/m?  ? ?General Appearance:    Alert, cooperative, no distress, appears stated  age  ?Head:    Normocephalic, without obvious abnormality, atraumatic  ?Eyes:    PERRL, conjunctiva/corneas clear, EOM's intact, fundi  ?  benign, both eyes  ?   ?   ?Throat:   Lips, mucosa, and tongue normal; teeth and gums normal  ?Neck:   Supple, symmetrical, trachea midline, no adenopathy;  ?  thyroid:  no enlargement/tenderness/nodules; no carotid ?  bruit or JVD  ?Back:     Symmetric, no curvature, ROM normal, no CVA tenderness  ?Lungs:     Clear to auscultation bilaterally, respirations unlabored  ?Chest Wall:    No tenderness or deformity  ? Heart:    Regular rate and rhythm, S1 and S2 normal, no murmur, rub   or gallop  ?   ?Abdomen:     Soft, non-tender, bowel sounds active all four quadrants,  ?  no masses, no organomegaly  ?   ?   ?Extremities:   Extremities normal, atraumatic, no cyanosis or edema  ?Pulses:   2+ and symmetric all extremities  ?Skin:   Skin color, texture, turgor normal, no rashes or lesions  ?Lymph nodes:   Cervical, supraclavicular, and axillary nodes normal  ?Neurologic:   CNII-XII intact, normal strength, sensation and reflexes  ?  throughout  ? ? ?LABORATORY DATA:  ?No results found for this or any previous visit (from the past 48 hour(s)).   ? ?RADIOGRAPHY: ?No results found.   ?  ?PATHOLOGY: None ? ?ASSESSMENT/PLAN: Ms. Games is a very pleasant 71 yo caucasian female with recent anemia noted with PCP.   ?She is iron deficient with saturation 4% and ferritin 41. We will get her set up for IV iron.  ?Blood smear reviewed with Dr. Marin Olp and target cells noted. No other abnormality or evidence of malignancy.  ?We wi

## 2021-05-25 NOTE — Telephone Encounter (Signed)
Critical lab result called by Bland Span in lab for potassium 2.5 ?Lottie Dawson, NP aware and orders received. This RN called patient to inform her of the lab result. Pt educated that she is to take 40 meq of potassium daily starting today until Monday 05/31/21 when we will recheck her labs. Pt and husband verbalized understanding and had no further questions.  ?

## 2021-05-26 ENCOUNTER — Telehealth: Payer: Self-pay | Admitting: *Deleted

## 2021-05-26 LAB — IRON AND IRON BINDING CAPACITY (CC-WL,HP ONLY)
Iron: 18 ug/dL — ABNORMAL LOW (ref 28–170)
Saturation Ratios: 4 % — ABNORMAL LOW (ref 10.4–31.8)
TIBC: 484 ug/dL — ABNORMAL HIGH (ref 250–450)
UIBC: 466 ug/dL — ABNORMAL HIGH (ref 148–442)

## 2021-05-26 LAB — ERYTHROPOIETIN: Erythropoietin: 1655 m[IU]/mL — ABNORMAL HIGH (ref 2.6–18.5)

## 2021-05-26 LAB — FERRITIN: Ferritin: 41 ng/mL (ref 11–307)

## 2021-05-26 NOTE — Telephone Encounter (Signed)
Per 05/25/21 los - called and gave upcoming appointments - confirmed ?

## 2021-05-26 NOTE — Telephone Encounter (Signed)
Per scheduling message called and lvm for callback to schedule a lab only appointment for Monday 05/31/21. ?

## 2021-05-27 ENCOUNTER — Encounter: Payer: Self-pay | Admitting: Family

## 2021-05-28 ENCOUNTER — Telehealth: Payer: Self-pay | Admitting: *Deleted

## 2021-05-28 NOTE — Telephone Encounter (Signed)
Per scheduling message - called and gave upcoming appointments - confirmed ?

## 2021-05-31 ENCOUNTER — Other Ambulatory Visit: Payer: Self-pay

## 2021-05-31 ENCOUNTER — Inpatient Hospital Stay: Payer: Medicare Other

## 2021-05-31 DIAGNOSIS — E876 Hypokalemia: Secondary | ICD-10-CM

## 2021-05-31 DIAGNOSIS — R42 Dizziness and giddiness: Secondary | ICD-10-CM | POA: Diagnosis not present

## 2021-05-31 DIAGNOSIS — J449 Chronic obstructive pulmonary disease, unspecified: Secondary | ICD-10-CM | POA: Diagnosis not present

## 2021-05-31 DIAGNOSIS — I110 Hypertensive heart disease with heart failure: Secondary | ICD-10-CM | POA: Diagnosis not present

## 2021-05-31 DIAGNOSIS — I4891 Unspecified atrial fibrillation: Secondary | ICD-10-CM | POA: Diagnosis not present

## 2021-05-31 DIAGNOSIS — R5383 Other fatigue: Secondary | ICD-10-CM | POA: Diagnosis not present

## 2021-05-31 DIAGNOSIS — D649 Anemia, unspecified: Secondary | ICD-10-CM | POA: Diagnosis not present

## 2021-05-31 LAB — CMP (CANCER CENTER ONLY)
ALT: 10 U/L (ref 0–44)
AST: 14 U/L — ABNORMAL LOW (ref 15–41)
Albumin: 3.5 g/dL (ref 3.5–5.0)
Alkaline Phosphatase: 82 U/L (ref 38–126)
Anion gap: 8 (ref 5–15)
BUN: 17 mg/dL (ref 8–23)
CO2: 37 mmol/L — ABNORMAL HIGH (ref 22–32)
Calcium: 10.4 mg/dL — ABNORMAL HIGH (ref 8.9–10.3)
Chloride: 93 mmol/L — ABNORMAL LOW (ref 98–111)
Creatinine: 1.02 mg/dL — ABNORMAL HIGH (ref 0.44–1.00)
GFR, Estimated: 60 mL/min — ABNORMAL LOW (ref >60)
Glucose, Bld: 138 mg/dL — ABNORMAL HIGH (ref 70–99)
Potassium: 3.1 mmol/L — ABNORMAL LOW (ref 3.5–5.1)
Sodium: 138 mmol/L (ref 135–145)
Total Bilirubin: 1 mg/dL (ref 0.3–1.2)
Total Protein: 5.9 g/dL — ABNORMAL LOW (ref 6.5–8.1)

## 2021-06-03 ENCOUNTER — Other Ambulatory Visit: Payer: Self-pay

## 2021-06-03 ENCOUNTER — Inpatient Hospital Stay: Payer: Medicare Other

## 2021-06-03 VITALS — BP 97/48 | HR 69 | Temp 98.3°F | Resp 18

## 2021-06-03 DIAGNOSIS — D509 Iron deficiency anemia, unspecified: Secondary | ICD-10-CM

## 2021-06-03 DIAGNOSIS — R5383 Other fatigue: Secondary | ICD-10-CM | POA: Diagnosis not present

## 2021-06-03 DIAGNOSIS — I110 Hypertensive heart disease with heart failure: Secondary | ICD-10-CM | POA: Diagnosis not present

## 2021-06-03 DIAGNOSIS — R42 Dizziness and giddiness: Secondary | ICD-10-CM | POA: Diagnosis not present

## 2021-06-03 DIAGNOSIS — D649 Anemia, unspecified: Secondary | ICD-10-CM | POA: Diagnosis not present

## 2021-06-03 DIAGNOSIS — I4891 Unspecified atrial fibrillation: Secondary | ICD-10-CM | POA: Diagnosis not present

## 2021-06-03 DIAGNOSIS — J449 Chronic obstructive pulmonary disease, unspecified: Secondary | ICD-10-CM | POA: Diagnosis not present

## 2021-06-03 MED ORDER — SODIUM CHLORIDE 0.9 % IV SOLN: Freq: Once | INTRAVENOUS | Status: AC

## 2021-06-03 MED ORDER — SODIUM CHLORIDE 0.9 % IV SOLN
510.0000 mg | Freq: Once | INTRAVENOUS | Status: AC
  Administered 2021-06-03: 510 mg via INTRAVENOUS
  Filled 2021-06-03: qty 17

## 2021-06-03 NOTE — Patient Instructions (Signed)

## 2021-06-10 ENCOUNTER — Inpatient Hospital Stay: Payer: Medicare Other

## 2021-06-10 VITALS — BP 108/55 | HR 100 | Temp 98.4°F | Resp 22

## 2021-06-10 DIAGNOSIS — J449 Chronic obstructive pulmonary disease, unspecified: Secondary | ICD-10-CM | POA: Diagnosis not present

## 2021-06-10 DIAGNOSIS — I110 Hypertensive heart disease with heart failure: Secondary | ICD-10-CM | POA: Diagnosis not present

## 2021-06-10 DIAGNOSIS — R42 Dizziness and giddiness: Secondary | ICD-10-CM | POA: Diagnosis not present

## 2021-06-10 DIAGNOSIS — I4891 Unspecified atrial fibrillation: Secondary | ICD-10-CM | POA: Diagnosis not present

## 2021-06-10 DIAGNOSIS — R5383 Other fatigue: Secondary | ICD-10-CM | POA: Diagnosis not present

## 2021-06-10 DIAGNOSIS — D649 Anemia, unspecified: Secondary | ICD-10-CM | POA: Diagnosis not present

## 2021-06-10 DIAGNOSIS — D509 Iron deficiency anemia, unspecified: Secondary | ICD-10-CM

## 2021-06-10 MED ORDER — SODIUM CHLORIDE 0.9 % IV SOLN
510.0000 mg | Freq: Once | INTRAVENOUS | Status: AC
  Administered 2021-06-10: 510 mg via INTRAVENOUS
  Filled 2021-06-10: qty 510

## 2021-06-10 MED ORDER — SODIUM CHLORIDE 0.9 % IV SOLN: Freq: Once | INTRAVENOUS | Status: AC

## 2021-06-10 NOTE — Patient Instructions (Signed)

## 2021-06-22 ENCOUNTER — Other Ambulatory Visit: Payer: Self-pay | Admitting: Family Medicine

## 2021-06-22 DIAGNOSIS — Z1231 Encounter for screening mammogram for malignant neoplasm of breast: Secondary | ICD-10-CM

## 2021-06-24 DIAGNOSIS — S065XAA Traumatic subdural hemorrhage with loss of consciousness status unknown, initial encounter: Secondary | ICD-10-CM | POA: Diagnosis not present

## 2021-06-24 DIAGNOSIS — I483 Typical atrial flutter: Secondary | ICD-10-CM | POA: Diagnosis not present

## 2021-06-24 DIAGNOSIS — I4811 Longstanding persistent atrial fibrillation: Secondary | ICD-10-CM | POA: Diagnosis not present

## 2021-06-24 DIAGNOSIS — I428 Other cardiomyopathies: Secondary | ICD-10-CM | POA: Diagnosis not present

## 2021-06-24 DIAGNOSIS — D649 Anemia, unspecified: Secondary | ICD-10-CM | POA: Diagnosis not present

## 2021-06-24 DIAGNOSIS — Z720 Tobacco use: Secondary | ICD-10-CM | POA: Diagnosis not present

## 2021-06-24 DIAGNOSIS — J449 Chronic obstructive pulmonary disease, unspecified: Secondary | ICD-10-CM | POA: Diagnosis not present

## 2021-07-06 ENCOUNTER — Encounter: Payer: Self-pay | Admitting: Family

## 2021-07-06 ENCOUNTER — Telehealth: Payer: Self-pay | Admitting: *Deleted

## 2021-07-06 ENCOUNTER — Inpatient Hospital Stay (HOSPITAL_BASED_OUTPATIENT_CLINIC_OR_DEPARTMENT_OTHER): Payer: Medicare Other | Admitting: Family

## 2021-07-06 ENCOUNTER — Inpatient Hospital Stay: Payer: Medicare Other | Attending: Hematology & Oncology

## 2021-07-06 VITALS — BP 131/54 | HR 81 | Temp 98.3°F | Resp 18 | Wt 171.0 lb

## 2021-07-06 DIAGNOSIS — D509 Iron deficiency anemia, unspecified: Secondary | ICD-10-CM | POA: Insufficient documentation

## 2021-07-06 DIAGNOSIS — D569 Thalassemia, unspecified: Secondary | ICD-10-CM

## 2021-07-06 DIAGNOSIS — Z79899 Other long term (current) drug therapy: Secondary | ICD-10-CM | POA: Insufficient documentation

## 2021-07-06 DIAGNOSIS — D696 Thrombocytopenia, unspecified: Secondary | ICD-10-CM | POA: Diagnosis not present

## 2021-07-06 DIAGNOSIS — R350 Frequency of micturition: Secondary | ICD-10-CM | POA: Diagnosis not present

## 2021-07-06 DIAGNOSIS — M7989 Other specified soft tissue disorders: Secondary | ICD-10-CM | POA: Insufficient documentation

## 2021-07-06 DIAGNOSIS — J449 Chronic obstructive pulmonary disease, unspecified: Secondary | ICD-10-CM | POA: Insufficient documentation

## 2021-07-06 DIAGNOSIS — K921 Melena: Secondary | ICD-10-CM | POA: Diagnosis not present

## 2021-07-06 DIAGNOSIS — K5900 Constipation, unspecified: Secondary | ICD-10-CM | POA: Insufficient documentation

## 2021-07-06 DIAGNOSIS — K7460 Unspecified cirrhosis of liver: Secondary | ICD-10-CM | POA: Diagnosis not present

## 2021-07-06 LAB — CBC WITH DIFFERENTIAL (CANCER CENTER ONLY)
Abs Immature Granulocytes: 0.03 10*3/uL (ref 0.00–0.07)
Basophils Absolute: 0.1 10*3/uL (ref 0.0–0.1)
Basophils Relative: 1 %
Eosinophils Absolute: 0.1 10*3/uL (ref 0.0–0.5)
Eosinophils Relative: 2 %
HCT: 38.2 % (ref 36.0–46.0)
Hemoglobin: 11.8 g/dL — ABNORMAL LOW (ref 12.0–15.0)
Immature Granulocytes: 1 %
Lymphocytes Relative: 20 %
Lymphs Abs: 1.2 10*3/uL (ref 0.7–4.0)
MCH: 29.6 pg (ref 26.0–34.0)
MCHC: 30.9 g/dL (ref 30.0–36.0)
MCV: 96 fL (ref 80.0–100.0)
Monocytes Absolute: 0.6 10*3/uL (ref 0.1–1.0)
Monocytes Relative: 9 %
Neutro Abs: 4.2 10*3/uL (ref 1.7–7.7)
Neutrophils Relative %: 67 %
Platelet Count: 99 10*3/uL — ABNORMAL LOW (ref 150–400)
RBC: 3.98 MIL/uL (ref 3.87–5.11)
RDW: 24.8 % — ABNORMAL HIGH (ref 11.5–15.5)
WBC Count: 6.1 10*3/uL (ref 4.0–10.5)
nRBC: 0 % (ref 0.0–0.2)

## 2021-07-06 LAB — IRON AND IRON BINDING CAPACITY (CC-WL,HP ONLY)
Iron: 46 ug/dL (ref 28–170)
Saturation Ratios: 14 % (ref 10.4–31.8)
TIBC: 330 ug/dL (ref 250–450)
UIBC: 284 ug/dL (ref 148–442)

## 2021-07-06 LAB — RETICULOCYTES
Immature Retic Fract: 29.6 % — ABNORMAL HIGH (ref 2.3–15.9)
RBC.: 3.9 MIL/uL (ref 3.87–5.11)
Retic Count, Absolute: 83.1 10*3/uL (ref 19.0–186.0)
Retic Ct Pct: 2.1 % (ref 0.4–3.1)

## 2021-07-06 LAB — FERRITIN: Ferritin: 74 ng/mL (ref 11–307)

## 2021-07-06 NOTE — Progress Notes (Signed)
?Hematology and Oncology Follow Up Visit ? ?Kelly Roth ?[id number] ?April 16, 1950 71 y.o. ?07/06/2021 ? ? ?Principle Diagnosis:  ?Iron deficiency anemia  ?Mild thrombocytopenia  ? ?Current Therapy:   ?IV iron as indicated  ?  ?Interim History:  Kelly Roth is here today for follow-up. She is still noting some fatigue.  ?She states that she has occasion bright red blood in her stool due to constipation/ straining and hemorrhoids.  ?No petechiae.  ?She notes SOB with exertion secondary to COPD. No changed from baseline.  ?No fever, chills, n/v, cough, rash, dizziness, chest pain, palpitations or abdominal pain/bloating.   ?She is in a wheelchair today. She uses a walker at home when ambulating for added support.  ?She did have a fall last week and has a small abrasion on the bottom of her left shin.  ?She has chronic swelling in her lower extremities. She has not been taking her lasix due to urinary frequency.  ?No syncope.  ?She has been eating well and hydrating throughout the day. Her weight is 171 lbs.  ? ?ECOG Performance Status: 2 - Symptomatic, <50% confined to bed ? ?Medications:  ?Allergies as of 07/06/2021   ? ?   Reactions  ? Nsaids Other (See Comments)  ? ?  ? ?  ?Medication List  ?  ? ?  ? Accurate as of July 06, 2021 12:22 PM. If you have any questions, ask your nurse or doctor.  ?  ?  ? ?  ? ?albuterol 108 (90 Base) MCG/ACT inhaler ?Commonly known as: VENTOLIN HFA ?Inhale 1-2 puffs into the lungs every 6 (six) hours as needed for wheezing or shortness of breath. ?  ?dapagliflozin propanediol 5 MG Tabs tablet ?Commonly known as: [place] ?Take 1 tablet (5 mg total) by mouth daily before breakfast. ?  ?DULoxetine 60 MG capsule ?Commonly known as: CYMBALTA ?TAKE 1 CAPSULE BY MOUTH EVERY DAY ?  ?Fish Oil 1000 MG Caps ?Take by mouth. ?  ?furosemide 40 MG tablet ?Commonly known as: LASIX ?Take 1 tablet (40 mg total) by mouth daily. OK to take 2 tabs if weight is > 193 lbs ?  ?HYDROcodone-acetaminophen  10-325 MG tablet ?Commonly known as: NORCO ?Take 1 tablet by mouth every 8 (eight) hours as needed. ?  ?HYDROcodone-acetaminophen 10-325 MG tablet ?Commonly known as: NORCO ?Take 1 tablet by mouth every 8 (eight) hours as needed. ?  ?HYDROcodone-acetaminophen 10-325 MG tablet ?Commonly known as: NORCO ?Take 1 tablet by mouth every 8 (eight) hours as needed. ?Start taking on: Jul 14, 2021 ?  ?lisinopril 5 MG tablet ?Commonly known as: ZESTRIL ?TAKE 1 TABLET (5 MG TOTAL) BY MOUTH DAILY. ?  ?metoprolol succinate 50 MG 24 hr tablet ?Commonly known as: TOPROL-XL ?Take 25 mg by mouth daily. Take with or immediately following a meal. ?  ?multivitamin capsule ?Take 1 capsule by mouth daily. ?  ?pantoprazole 40 MG tablet ?Commonly known as: PROTONIX ?Take 40 mg by mouth daily. ?  ?potassium chloride SA 20 MEQ tablet ?Commonly known as: KLOR-CON M ?Take 2 tablets (40 mEq total) by mouth once for 1 dose. ?  ?rosuvastatin 40 MG tablet ?Commonly known as: CRESTOR ?Take 1 tablet (40 mg total) by mouth at bedtime. ?  ?Trelegy Ellipta 100-62.5-25 MCG/ACT Aepb ?Generic drug: Fluticasone-Umeclidin-Vilant ?Inhale 1 puff into the lungs daily. ?  ? ?  ? ? ?Allergies:  ?Allergies  ?Allergen Reactions  ? Nsaids Other (See Comments)  ? ? ?Past Medical History, Surgical history, Social history, and Family  History were reviewed and updated. ? ?Review of Systems: ?All other 10 point review of systems is negative.  ? ?Physical Exam: ? vitals were not taken for this visit.  ? ?Wt Readings from Last 3 Encounters:  ?05/25/21 185 lb (83.9 kg)  ?05/18/21 183 lb (83 kg)  ?04/12/21 189 lb (85.7 kg)  ? ? ?Ocular: Sclerae unicteric, pupils equal, round and reactive to light ?Ear-nose-throat: Oropharynx clear, dentition fair ?Lymphatic: No cervical or supraclavicular adenopathy ?Lungs no rales or rhonchi, good excursion bilaterally ?Heart regular rate and rhythm, no murmur appreciated ?Abd soft, nontender, positive bowel sounds ?MSK no focal spinal  tenderness, no joint edema ?Neuro: non-focal, well-oriented, appropriate affect ?Breasts: Deferred  ? ?Lab Results  ?Component Value Date  ? WBC 4.8 05/25/2021  ? HGB 8.9 (L) 05/25/2021  ? HCT 30.6 (L) 05/25/2021  ? MCV 85.5 05/25/2021  ? PLT 140 (L) 05/25/2021  ? ?Lab Results  ?Component Value Date  ? FERRITIN 41 05/25/2021  ? IRON 18 (L) 05/25/2021  ? TIBC 484 (H) 05/25/2021  ? UIBC 466 (H) 05/25/2021  ? IRONPCTSAT 4 (L) 05/25/2021  ? ?Lab Results  ?Component Value Date  ? RETICCTPCT 2.1 07/06/2021  ? RBC 3.90 07/06/2021  ? ?No results found for: KPAFRELGTCHN, LAMBDASER, KAPLAMBRATIO ?No results found for: IGGSERUM, IGA, IGMSERUM ?No results found for: TOTALPROTELP, ALBUMINELP, A1GS, A2GS, BETS, BETA2SER, GAMS, MSPIKE, SPEI ?  Chemistry   ?   ?Component Value Date/Time  ? NA 138 05/31/2021 1105  ? K 3.1 (L) 05/31/2021 1105  ? CL 93 (L) 05/31/2021 1105  ? CO2 37 (H) 05/31/2021 1105  ? BUN 17 05/31/2021 1105  ? CREATININE 1.02 (H) 05/31/2021 1105  ? CREATININE 0.76 02/18/2021 0000  ?    ?Component Value Date/Time  ? CALCIUM 10.4 (H) 05/31/2021 1105  ? ALKPHOS 82 05/31/2021 1105  ? AST 14 (L) 05/31/2021 1105  ? ALT 10 05/31/2021 1105  ? BILITOT 1.0 05/31/2021 1105  ?  ? ? ? ?Impression and Plan: Kelly Roth is a very pleasant 71 yo caucasian female iron deficiency anemia.  ?Iron studies are pending.  ?We will get an US of the abdomen to assess her liver and spleen.  ?Follow-up in 3 months.  ? ?Lottie Dawson, NP ?4/25/202312:22 PM ? ?

## 2021-07-06 NOTE — Telephone Encounter (Signed)
Per 07/06/21 los - gave upcoming appointments - confirmed ?

## 2021-07-08 LAB — HGB FRACTIONATION CASCADE
Hgb A2: 2.3 % (ref 1.8–3.2)
Hgb A: 97.7 % (ref 96.4–98.8)
Hgb F: 0 % (ref 0.0–2.0)
Hgb S: 0 %

## 2021-07-14 ENCOUNTER — Encounter: Payer: Self-pay | Admitting: Gastroenterology

## 2021-07-28 ENCOUNTER — Ambulatory Visit (INDEPENDENT_AMBULATORY_CARE_PROVIDER_SITE_OTHER): Payer: Medicare Other | Admitting: Gastroenterology

## 2021-07-28 ENCOUNTER — Encounter: Payer: Self-pay | Admitting: Gastroenterology

## 2021-07-28 VITALS — BP 118/78 | HR 100 | Ht 61.0 in | Wt 173.0 lb

## 2021-07-28 DIAGNOSIS — D696 Thrombocytopenia, unspecified: Secondary | ICD-10-CM | POA: Diagnosis not present

## 2021-07-28 DIAGNOSIS — K5900 Constipation, unspecified: Secondary | ICD-10-CM

## 2021-07-28 DIAGNOSIS — D509 Iron deficiency anemia, unspecified: Secondary | ICD-10-CM

## 2021-07-28 NOTE — Progress Notes (Signed)
? ? ?Assessment   ? ?New iron deficiency anemia.  Rule out colorectal neoplasms, gastric neoplasm, ulcer, AVMs, celiac disease ?Constipation  ?History of LA Class A esophagitis and erosive gastritis ?Thrombocytopenia.  Rule out cirrhosis ?COPD with 87% sat on room air today ? ?Recommendations  ? ?Colonoscopy and EGD at hospital due to elevated risk. The risks (including bleeding, perforation, infection, missed lesions, medication reactions and possible hospitalization or surgery if complications occur), benefits, and alternatives to colonoscopy with possible biopsy and possible polypectomy were discussed with the patient and they consent to proceed.  The risks (including bleeding, perforation, infection, missed lesions, medication reactions and possible hospitalization or surgery if complications occur), benefits, and alternatives to endoscopy with possible biopsy and possible dilation were discussed with the patient and they consent to proceed.   ?MiraLAX daily titrated dose between 1 and 2 scoops for complete bowel movement daily.  Ex-Lax daily as needed. ?Proceed with abdominal ultrasound as scheduled. Check PT/INR.  ?Return to PCP to follow-up on CMP abnormalities found on March 20 ? ? ? ?HPI  ? ?Chief complaint:  ? ?Patient profile:  ?Kelly Roth is a 71 y.o. female referred by Monico Hoar, FNP.  She is accompanied by her husband.  She was recently diagnosed with iron deficiency anemia and received 2 iron infusions.  Thrombocytopenia was also diagnosed and abdominal ultrasound was ordered.  She states she has frequent constipation with limited mobility and frequent hydrocodone usage.  She takes MiraLAX and Ex-Lax intermittently.  She has noted infrequent small amounts bright red blood per rectum when straining with constipation.  She also relates her appetite has decreased however her weight is stable.  See PMH below for additional history. ? ? ? ?Previous Labs / Imaging:: ? ?  Latest Ref Rng & Units  07/06/2021  ? 12:12 PM 05/25/2021  ?  2:36 PM 05/18/2021  ? 12:00 AM  ?CBC  ?WBC 4.0 - 10.5 K/uL 6.1   4.8   5.5    ?Hemoglobin 12.0 - 15.0 g/dL 11.8   8.9   9.4    ?Hematocrit 36.0 - 46.0 % 38.2   30.6   32.7    ?Platelets 150 - 400 K/uL 99   140   94    ? ? ?No results found for: LIPASE ? ?  Latest Ref Rng & Units 05/31/2021  ? 11:05 AM 05/25/2021  ?  2:36 PM 02/18/2021  ? 12:00 AM  ?CMP  ?Glucose 70 - 99 mg/dL 138   123   122    ?BUN 8 - 23 mg/dL '17   20   14    '$ ?Creatinine 0.44 - 1.00 mg/dL 1.02   0.92   0.76    ?Sodium 135 - 145 mmol/L 138   136   141    ?Potassium 3.5 - 5.1 mmol/L 3.1   2.5   3.7    ?Chloride 98 - 111 mmol/L 93   90   97    ?CO2 22 - 32 mmol/L 37   35   34    ?Calcium 8.9 - 10.3 mg/dL 10.4   9.3   10.2    ?Total Protein 6.5 - 8.1 g/dL 5.9   6.5     ?Total Bilirubin 0.3 - 1.2 mg/dL 1.0   0.9     ?Alkaline Phos 38 - 126 U/L 82   82     ?AST 15 - 41 U/L 14   22     ?ALT 0 -  44 U/L 10   15     ? ? ? ?Previous GI evaluation  ? ? ?Endoscopies: ? ?Colonoscopy 02/2013: 2 small hyperplastic polyps and internal hemorrhoids ?EGD 07/2006.  LA class A esophagitis and erosive gastritis ? ?Imaging:  ?CT CHEST LUNG CA SCREEN LOW DOSE W/O CM ?CLINICAL DATA:  Lung cancer screening. 51 pack-year history. Current ?asymptomatic smoker ? ?EXAM: ?CT CHEST WITHOUT CONTRAST LOW-DOSE FOR LUNG CANCER SCREENING ? ?TECHNIQUE: ?Multidetector CT imaging of the chest was performed following the ?standard protocol without IV contrast. ? ?RADIATION DOSE REDUCTION: This exam was performed according to the ?departmental dose-optimization program which includes automated ?exposure control, adjustment of the mA and/or kV according to ?patient size and/or use of iterative reconstruction technique. ? ?COMPARISON:  Current asymptomatic smoker. ? ?FINDINGS: ?Cardiovascular: There is mild cardiac enlargement. No pericardial ?effusion. Aortic atherosclerosis and coronary artery calcifications. ? ?Mediastinum/Nodes: Thyroid gland, trachea, and  esophagus are ?unremarkable. Enlarged right paratracheal lymph node is identified ?measuring 1.9 cm short axis, image 20/2. ? ?Lungs/Pleura: Mild centrilobular emphysema. There are small ?bilateral pleural effusions, right greater than left. Patchy areas ?of ground-glass attenuation are noted within both lungs. No airspace ?consolidation. No suspicious lung nodules identified. ? ?Upper Abdomen: No acute findings. Left adrenal nodule measures 2.2 ?cm and -11 Hounsfield units. There is a right adrenal nodule which ?measures 1.4 cm and -18.6 Hounsfield units. These are compatible ?with benign adenomas. Status post cholecystectomy. ? ?Musculoskeletal: No acute or suspicious osseous findings. Multilevel ?degenerative disc disease identified within the thoracic spine. ?Chronic deformity of the right clavicle noted. ? ?IMPRESSION: ?1. Lung-RADS 1s, negative. Continue annual screening with low-dose ?chest CT without contrast in 12 months. ?2. The S modifier above refers to the presence of cardiac ?enlargement, bilateral pleural effusions, and multifocal patchy ?ground-glass densities within both lungs. Correlate for clinical ?signs or symptoms of congestive heart failure. ?3. Enlarged right paratracheal lymph node measures 1.9 cm. This is a ?nonspecific finding but may be seen in the setting of congestive ?heart failure as well as emphysema. ?4. Bilateral adrenal adenomas. ?5. Aortic Atherosclerosis (ICD10-I70.0) and Emphysema (ICD10-J43.9). ?Coronary artery calcifications. ? ?Electronically Signed ?  By: Kerby Moors M.D. ?  On: 04/15/2021 09:51 ? ? ? ?Past Medical History:  ?Diagnosis Date  ? Adenomatous polyps 2004  ?  Colonoscopy done in [facility]  ? Arthritis   ? Atrial fibrillation ([place])   ? Atrial flutter ([place])   ? Back pain   ? Complication of anesthesia   ? COPD (chronic obstructive pulmonary disease) ([place])   ? Dyspnea   ? pain contributes but also pt. reveals that she has been out of her Spiriva for one  month +  ? GERD (gastroesophageal reflux disease)   ? Hemorrhoids   ? Hyperlipidemia   ? Hypertension   ? IDA (iron deficiency anemia)   ? Osteoarthritis   ? PONV (postoperative nausea and vomiting)   ? scopolamine patch used with success with a prev. surg.   ? ?Past Surgical History:  ?Procedure Laterality Date  ? ATRIAL ABLATION SURGERY  2019  ? CATARACT EXTRACTION, BILATERAL Bilateral 04/2020  ? CHOLECYSTECTOMY  05/08/2007  ? JOINT REPLACEMENT Bilateral   ? left knee replacement  05/2005  ? right on 2-06  ? LUMBAR LAMINECTOMY/DECOMPRESSION MICRODISCECTOMY N/A 03/01/2016  ? Procedure: LEFT AND CENTRAL L3-4 AND LEFT L4-5 DECOMPRESSIVE LUMBAR LAMINECTOMY FOR SPINAL STENOSIS;  Surgeon: Jessy Oto, MD;  Location: [place];  Service: Orthopedics;  Laterality: N/A;  ? MULTIPLE  TOOTH EXTRACTIONS    ? ?Family History  ?Problem Relation Age of Onset  ? Depression Mother   ? Hypertension Mother   ? Hyperlipidemia Mother   ? COPD Mother   ? Lung cancer Mother   ? Hyperlipidemia Father   ? Hypertension Father   ? Esophageal cancer Father   ? Breast cancer Maternal Grandmother   ? Colon cancer Neg Hx   ?     Neg   ? ?Social History  ? ?Tobacco Use  ? Smoking status: Every Day  ?  Packs/day: 1.00  ?  Years: 51.00  ?  Pack years: 51.00  ?  Types: Cigarettes  ? Smokeless tobacco: Never  ? Tobacco comments:  ?  1 pack a day   ?Vaping Use  ? Vaping Use: Never used  ?Substance Use Topics  ? Alcohol use: Not Currently  ? Drug use: No  ? ?Current Outpatient Medications  ?Medication Sig Dispense Refill  ? albuterol (VENTOLIN HFA) 108 (90 Base) MCG/ACT inhaler Inhale 1-2 puffs into the lungs every 6 (six) hours as needed for wheezing or shortness of breath. 18 each 1  ? dapagliflozin propanediol (FARXIGA) 5 MG TABS tablet Take 1 tablet (5 mg total) by mouth daily before breakfast. 90 tablet 1  ? DULoxetine (CYMBALTA) 60 MG capsule TAKE 1 CAPSULE BY MOUTH EVERY DAY 90 capsule 1  ? Fluticasone-Umeclidin-Vilant (TRELEGY ELLIPTA)  100-62.5-25 MCG/ACT AEPB Inhale 1 puff into the lungs daily. 3 each 3  ? furosemide (LASIX) 40 MG tablet Take 1 tablet (40 mg total) by mouth daily. OK to take 2 tabs if weight is > 193 lbs 1 tablet 0  ? HYDROcodon

## 2021-07-28 NOTE — Patient Instructions (Signed)
We will contact you with an hospital date when our August hospital schedule comes out.  ? ?Start over the counter Miralax mixing 17 grams in 8 oz of water daily.  ? ?The [facility] providers would like to encourage you to use [facility] to communicate with providers for non-urgent requests or questions.  Due to long hold times on the telephone, sending your provider a message by [facility] may be a faster and more efficient way to get a response.  Please allow 48 business hours for a response.  Please remember that this is for non-urgent requests.  ? ?Thank you for choosing me and [facility]. ? ?Malcolm T. Dagoberto Ligas., MD., [facility] ? ?

## 2021-08-05 ENCOUNTER — Ambulatory Visit (INDEPENDENT_AMBULATORY_CARE_PROVIDER_SITE_OTHER): Payer: Medicare Other

## 2021-08-05 DIAGNOSIS — Z1231 Encounter for screening mammogram for malignant neoplasm of breast: Secondary | ICD-10-CM

## 2021-08-06 NOTE — Progress Notes (Signed)
Please call patient. Normal mammogram.  Repeat in 1 year.  

## 2021-08-19 ENCOUNTER — Ambulatory Visit (INDEPENDENT_AMBULATORY_CARE_PROVIDER_SITE_OTHER): Payer: Medicare Other

## 2021-08-19 ENCOUNTER — Ambulatory Visit (INDEPENDENT_AMBULATORY_CARE_PROVIDER_SITE_OTHER): Payer: Medicare Other | Admitting: Family Medicine

## 2021-08-19 ENCOUNTER — Encounter: Payer: Self-pay | Admitting: Family Medicine

## 2021-08-19 VITALS — BP 105/48 | HR 84 | Resp 18 | Ht 61.0 in | Wt 159.0 lb

## 2021-08-19 DIAGNOSIS — M25552 Pain in left hip: Secondary | ICD-10-CM

## 2021-08-19 DIAGNOSIS — G8929 Other chronic pain: Secondary | ICD-10-CM | POA: Diagnosis not present

## 2021-08-19 DIAGNOSIS — M533 Sacrococcygeal disorders, not elsewhere classified: Secondary | ICD-10-CM

## 2021-08-19 DIAGNOSIS — J449 Chronic obstructive pulmonary disease, unspecified: Secondary | ICD-10-CM | POA: Diagnosis not present

## 2021-08-19 DIAGNOSIS — R0602 Shortness of breath: Secondary | ICD-10-CM

## 2021-08-19 DIAGNOSIS — D692 Other nonthrombocytopenic purpura: Secondary | ICD-10-CM | POA: Insufficient documentation

## 2021-08-19 MED ORDER — HYDROCODONE-ACETAMINOPHEN 10-325 MG PO TABS: 1.0000 | ORAL_TABLET | Freq: Three times a day (TID) | ORAL | 0 refills | Status: AC | PRN

## 2021-08-19 MED ORDER — ALBUTEROL SULFATE HFA 108 (90 BASE) MCG/ACT IN AERS: 1.0000 | INHALATION_SPRAY | Freq: Four times a day (QID) | RESPIRATORY_TRACT | 1 refills | Status: AC | PRN

## 2021-08-19 NOTE — Assessment & Plan Note (Signed)
I like to get her scheduled for another walk test.  It looks like its been a couple years since we have done one since she reports that she does drop into the 80s at times with her breathing.  She also has concomitant heart failure which sometimes when she gets volume overloaded contributes to her shortness of breath.  I do not see any evidence of volume overload on exam today and her pulse ox is 100%.

## 2021-08-19 NOTE — Assessment & Plan Note (Addendum)
I would like to start by getting some plain film x-rays just to rule out fracture etc.  1 option would be to slightly increased quantity on the hydrocodone or possibly even switch to oxycodone.  She has been on the 10 mg dose of her current regimen for about a year and a half.  Hydrocodone 10/325 1 po TID #90/mo History of lumbar laminectomy with decompression and microdiscectomy December 2017. Additional medications: Cymbalta Last UDS date:05/18/2021- monitor for alcohol Opioid Treatment Agreement signed (Y/N):05/18/2021 Opioid Treatment Agreement last reviewed with patient: 05/18/2021 NCCSRS reviewed this encounter (include red flags):Yes

## 2021-08-19 NOTE — Progress Notes (Signed)
Established Patient Office Visit  Subjective   Patient ID: Kelly Roth, female    DOB: 09/16/1950  Age: 71 y.o. MRN: [id number]  Chief Complaint  Patient presents with   Pain Management    Follow up. Patient would like to discuss increasing dose on Hydrocodone due to increased pain in thighs and butt.    Colonoscopy    Patient stated she will call to schedule appointment with [facility].     HPI Follow-up chronic pain management-history of lumbar laminectomy with decompression and microdiscectomy in 2017 with continued pain.  Currently on Robaxin, Cymbalta and piroxicam as well as hydrocodone 10/325 3 times a day.  When I last saw her she felt like her pain was stable and fairly well controlled but since then she says that she has actually fallen twice and landed on her bottom.  She has had an increase in pain over the sacral area.  To the point that its actually been keeping her awake at night and wants to know if we could adjust her pain medication regimen.  Follow-up COPD-she continues to smoke-she denies any recent increase or exacerbation of symptoms.  He says she still having intermittent episodes with her oxygen will drop into the 80s.  She does continue to smoke.  LE extremity weakness - she she has been doing her leg and arm exercises.  She has lost some weight and is happy with that.     ROS    Objective:     BP (!) 105/48   Pulse 84   Resp 18   Ht '5\' 1"'$  (1.549 m)   Wt 159 lb (72.1 kg)   SpO2 100%   BMI 30.04 kg/m    Physical Exam Vitals and nursing note reviewed.  Constitutional:      Appearance: She is well-developed.  HENT:     Head: Normocephalic and atraumatic.  Eyes:     Conjunctiva/sclera: Conjunctivae normal.  Cardiovascular:     Rate and Rhythm: Normal rate and regular rhythm.     Heart sounds: Normal heart sounds.  Pulmonary:     Effort: Pulmonary effort is normal.     Breath sounds: Normal breath sounds.  Musculoskeletal:      Comments: She points to the area of sacrum as the area of her increased pain recently and also mid buttock area on the left side.  Skin:    General: Skin is warm and dry.     Coloration: Skin is not pale.  Neurological:     Mental Status: She is alert and oriented to person, place, and time.  Psychiatric:        Behavior: Behavior normal.      No results found for any visits on 08/19/21.    The ASCVD Risk score (Arnett DK, et al., 2019) failed to calculate for the following reasons:   The valid total cholesterol range is 130 to 320 mg/dL    Assessment & Plan:   Problem List Items Addressed This Visit       Cardiovascular and Mediastinum   Senile purpura ([place])    She has significant benign bruising on both forearms.        Respiratory   Chronic obstructive pulmonary disease ([place])    I like to get her scheduled for another walk test.  It looks like its been a couple years since we have done one since she reports that she does drop into the 80s at times with her breathing.  She  also has concomitant heart failure which sometimes when she gets volume overloaded contributes to her shortness of breath.  I do not see any evidence of volume overload on exam today and her pulse ox is 100%.      Relevant Medications   albuterol (VENTOLIN HFA) 108 (90 Base) MCG/ACT inhaler     Other   Sacroiliac pain    She has pain over the sacral area that seems to be worse over the last couple of weeks but she is also had 2 falls.  So would like to get a plain film x-ray today just to make sure that she is not is not experiencing a fracture.  If there is no sign of fracture then consider a round of prednisone for acute relief.  She is really maxed out on what I would normally prescribe for someone.  But we may need may be able to provide a few extra pain medication pills for couple of weeks.      Relevant Orders   DG Sacrum/Coccyx   Encounter for chronic pain management - Primary    I would like  to start by getting some plain film x-rays just to rule out fracture etc.  1 option would be to slightly increased quantity on the hydrocodone or possibly even switch to oxycodone.  She has been on the 10 mg dose of her current regimen for about a year and a half.  Hydrocodone 10/325 1 po TID #90/mo History of lumbar laminectomy with decompression and microdiscectomy December 2017. Additional medications: Cymbalta Last UDS date: 05/18/2021 - monitor for alcohol  Opioid Treatment Agreement signed (Y/N):05/18/2021 Opioid Treatment Agreement last reviewed with patient:  05/18/2021 NCCSRS reviewed this encounter (include red flags): Yes      Relevant Medications   HYDROcodone-acetaminophen (NORCO) 10-325 MG tablet   Other Relevant Orders   DRUG MONITORING, PANEL 6 WITH CONFIRMATION, URINE   Other Visit Diagnoses     Shortness of breath       Relevant Medications   albuterol (VENTOLIN HFA) 108 (90 Base) MCG/ACT inhaler   Pain of left hip       Relevant Orders   DG Sacrum/Coccyx   DG HIP UNILAT W OR W/O PELVIS 2-3 VIEWS LEFT      Pain of left hip more posterior-we will get plain film x-ray today.  Return in about 3 months (around 11/19/2021) for pain management.    Beatrice Lecher, MD

## 2021-08-19 NOTE — Assessment & Plan Note (Signed)
She has pain over the sacral area that seems to be worse over the last couple of weeks but she is also had 2 falls.  So would like to get a plain film x-ray today just to make sure that she is not is not experiencing a fracture.  If there is no sign of fracture then consider a round of prednisone for acute relief.  She is really maxed out on what I would normally prescribe for someone.  But we may need may be able to provide a few extra pain medication pills for couple of weeks.

## 2021-08-19 NOTE — Assessment & Plan Note (Signed)
She has significant benign bruising on both forearms.

## 2021-08-22 LAB — DRUG MONITORING, PANEL 6 WITH CONFIRMATION, URINE
6 Acetylmorphine: NEGATIVE ng/mL (ref <10)
Alcohol Metabolites: NEGATIVE ng/mL (ref <500)
Amphetamines: NEGATIVE ng/mL (ref <500)
Barbiturates: NEGATIVE ng/mL (ref <300)
Benzodiazepines: NEGATIVE ng/mL (ref <100)
Cocaine Metabolite: NEGATIVE ng/mL (ref <150)
Codeine: NEGATIVE ng/mL (ref <50)
Creatinine: 54.4 mg/dL (ref >=20.0)
Hydrocodone: 910 ng/mL — ABNORMAL HIGH (ref <50)
Hydromorphone: 247 ng/mL — ABNORMAL HIGH (ref <50)
Marijuana Metabolite: 32 ng/mL — ABNORMAL HIGH (ref <5)
Marijuana Metabolite: POSITIVE ng/mL — AB (ref <20)
Methadone Metabolite: NEGATIVE ng/mL (ref <100)
Morphine: NEGATIVE ng/mL (ref <50)
Norhydrocodone: 4043 ng/mL — ABNORMAL HIGH (ref <50)
Opiates: POSITIVE ng/mL — AB (ref <100)
Oxidant: NEGATIVE ug/mL (ref <200)
Oxycodone: NEGATIVE ng/mL (ref <100)
Phencyclidine: NEGATIVE ng/mL (ref <25)
pH: 6.8 (ref 4.5–9.0)

## 2021-08-22 LAB — DM TEMPLATE

## 2021-08-23 NOTE — Progress Notes (Signed)
Hi Kelly Roth, no sign of acute fracture which is very reassuring.  If you are not improving over the next 2 to 3 weeks then please let us know.  The plan will be to get you in with an orthopedist at that point if you are still having significant pain or discomfort.

## 2021-08-23 NOTE — Progress Notes (Signed)
Kelly Roth, they did not see an acute fracture of the sacrum.  There is a little bit of curvature and arthritis in the spine but again no acute fracture.  If you are not improving over the next 2 to 3 weeks then like to get you in with Dr. Darene Lamer or an orthopedist for further work-up.

## 2021-09-09 ENCOUNTER — Encounter: Payer: Self-pay | Admitting: Gastroenterology

## 2021-09-15 ENCOUNTER — Telehealth: Payer: Self-pay | Admitting: *Deleted

## 2021-09-15 ENCOUNTER — Other Ambulatory Visit: Payer: Self-pay

## 2021-09-15 DIAGNOSIS — Z1211 Encounter for screening for malignant neoplasm of colon: Secondary | ICD-10-CM

## 2021-09-15 DIAGNOSIS — K5900 Constipation, unspecified: Secondary | ICD-10-CM

## 2021-09-15 DIAGNOSIS — D509 Iron deficiency anemia, unspecified: Secondary | ICD-10-CM

## 2021-09-15 NOTE — Telephone Encounter (Signed)
Patient has been rescheduled for endo colon at [facility] with Dr. Fuller Plan on 11/08/21 at 7:30 am. PV appt remains the same. Amb ref & hospital orders placed. She says that she is not diabetic, however she is on blood thinners. Clearance letter has been sent to PCP.

## 2021-09-15 NOTE — Telephone Encounter (Signed)
Kelly Roth,  Please see office note from [place] on 07/28/2021. I cancelled the [place] colonoscopy that was scheduled in error. Please call patient and schedule patient for hospital ENDO/Colon procedures per OV notes.  PV is still scheduled for 10-15-2030. Thank you, Maciel Kegg PV

## 2021-09-23 ENCOUNTER — Telehealth: Payer: Self-pay

## 2021-09-23 NOTE — Telephone Encounter (Signed)
-----   Message from Hali Marry, MD sent at 09/22/2021  5:08 PM EDT ----- Macario Golds,   Recommend that she hold her Xarelto the day prior to surgery and the day of surgery.  She can restart the evening after surgery.  Hope this is helpful.  If I need to send this more formally through a letter then please let me know.  Beatrice Lecher MD ----- Message ----- From: Carl Best, RN Sent: 09/22/2021   9:17 AM EDT To: Hali Marry, MD  Please advise, thank you.

## 2021-09-23 NOTE — Telephone Encounter (Signed)
Left message for patient to call back  

## 2021-09-24 NOTE — Telephone Encounter (Signed)
Left message for patient to call back  

## 2021-09-27 NOTE — Telephone Encounter (Signed)
Unable to reach patient x 3. Left message for patient to call back. Clearance will be given at time of pre visit as well.

## 2021-10-02 DIAGNOSIS — R9431 Abnormal electrocardiogram [ECG] [EKG]: Secondary | ICD-10-CM | POA: Diagnosis not present

## 2021-10-02 DIAGNOSIS — I4891 Unspecified atrial fibrillation: Secondary | ICD-10-CM | POA: Diagnosis not present

## 2021-10-02 DIAGNOSIS — Z66 Do not resuscitate: Secondary | ICD-10-CM | POA: Diagnosis not present

## 2021-10-02 DIAGNOSIS — J9690 Respiratory failure, unspecified, unspecified whether with hypoxia or hypercapnia: Secondary | ICD-10-CM | POA: Diagnosis not present

## 2021-10-02 DIAGNOSIS — I469 Cardiac arrest, cause unspecified: Secondary | ICD-10-CM | POA: Diagnosis not present

## 2021-10-02 DIAGNOSIS — E119 Type 2 diabetes mellitus without complications: Secondary | ICD-10-CM | POA: Diagnosis not present

## 2021-10-05 ENCOUNTER — Inpatient Hospital Stay: Payer: Medicare Other | Admitting: Family

## 2021-10-05 ENCOUNTER — Telehealth: Payer: Self-pay | Admitting: *Deleted

## 2021-10-05 ENCOUNTER — Inpatient Hospital Stay: Payer: Medicare Other | Attending: Hematology & Oncology

## 2021-10-05 NOTE — Telephone Encounter (Signed)
Kelly Roth with [facility] was calling to confirm that we received the fax for pt's death certificate.   I informed him that I would be looking for this and we would get this back to their office as soon as possible.

## 2021-10-06 NOTE — Telephone Encounter (Signed)
Death certificate work sheet faxed and confirmation received

## 2021-10-12 DEATH — deceased

## 2021-11-05 ENCOUNTER — Other Ambulatory Visit: Payer: Self-pay | Admitting: Family Medicine

## 2021-11-05 DIAGNOSIS — I5023 Acute on chronic systolic (congestive) heart failure: Secondary | ICD-10-CM

## 2021-11-08 ENCOUNTER — Ambulatory Visit (HOSPITAL_COMMUNITY): Admit: 2021-11-08 | Payer: No Typology Code available for payment source | Admitting: Gastroenterology

## 2021-11-08 SURGERY — Surgical Case
Anesthesia: *Unknown

## 2021-11-08 SURGERY — ESOPHAGOGASTRODUODENOSCOPY (EGD) WITH PROPOFOL
Anesthesia: Monitor Anesthesia Care

## 2021-11-09 ENCOUNTER — Encounter: Payer: No Typology Code available for payment source | Admitting: Gastroenterology

## 2021-11-19 ENCOUNTER — Ambulatory Visit: Payer: No Typology Code available for payment source | Admitting: Family Medicine

## 2023-01-17 IMAGING — DX DG SACRUM/COCCYX 2+V
3 series · 3 of 3 positions shown · non-contrast
Comparison: Pelvis and left hip radiographs 12/04/2020

CLINICAL DATA: Left hip pain.  Multiple falls in the last 2 weeks.

EXAM:
SACRUM AND COCCYX - 2+ VIEW; DG HIP (WITH OR WITHOUT PELVIS) 2-3V
LEFT

[coccyx ap]
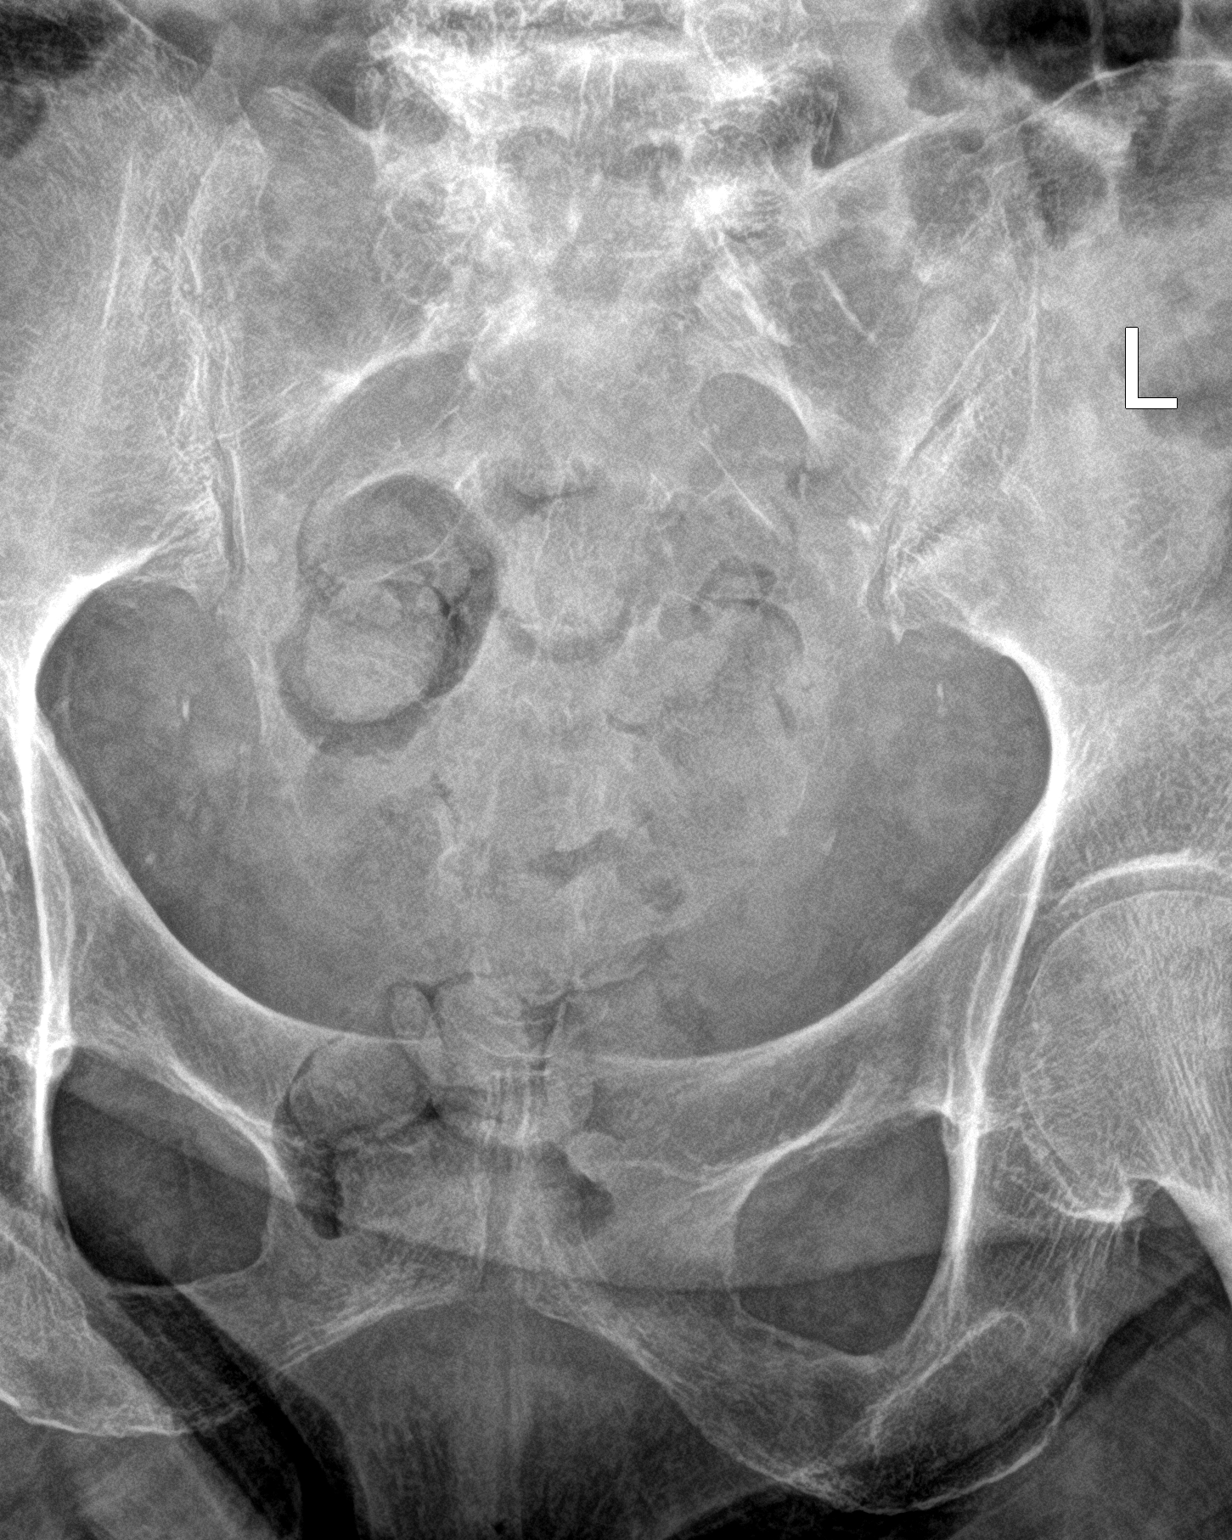

[sacrum ap]
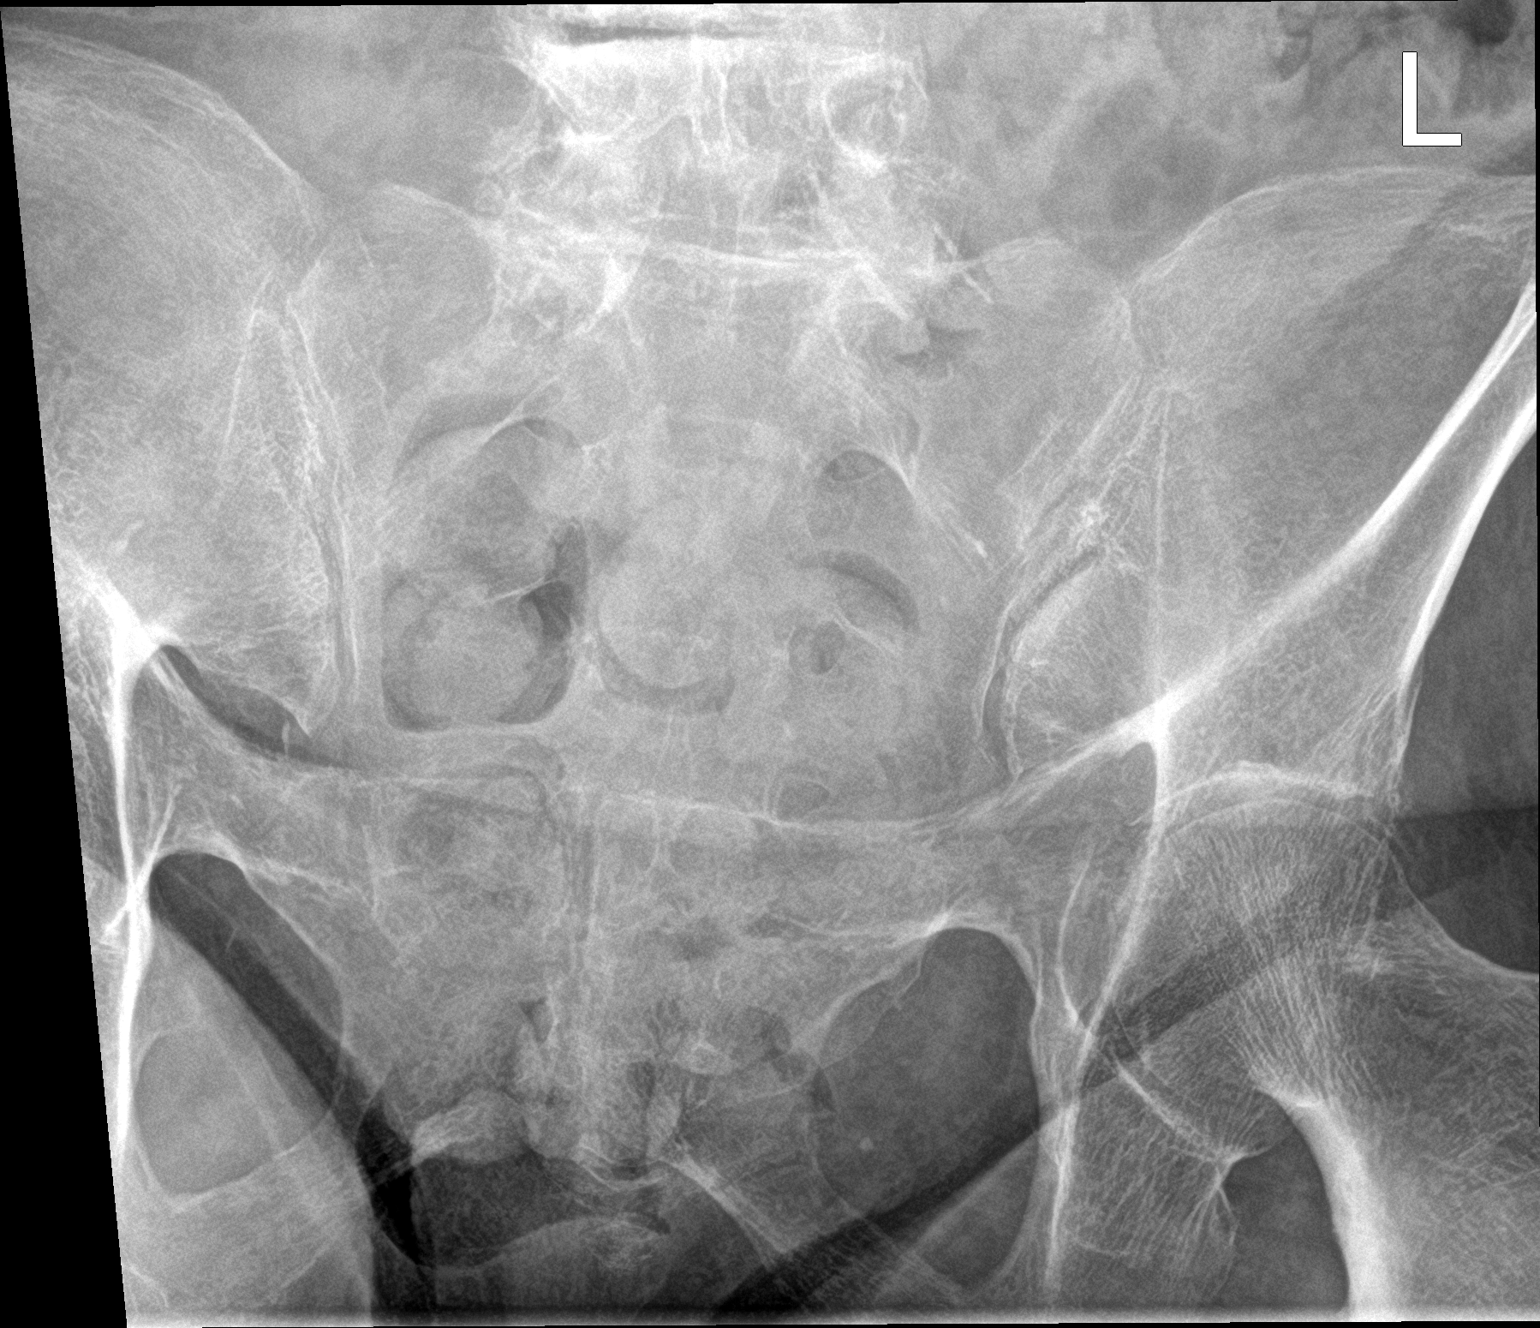

[sacrum lat]
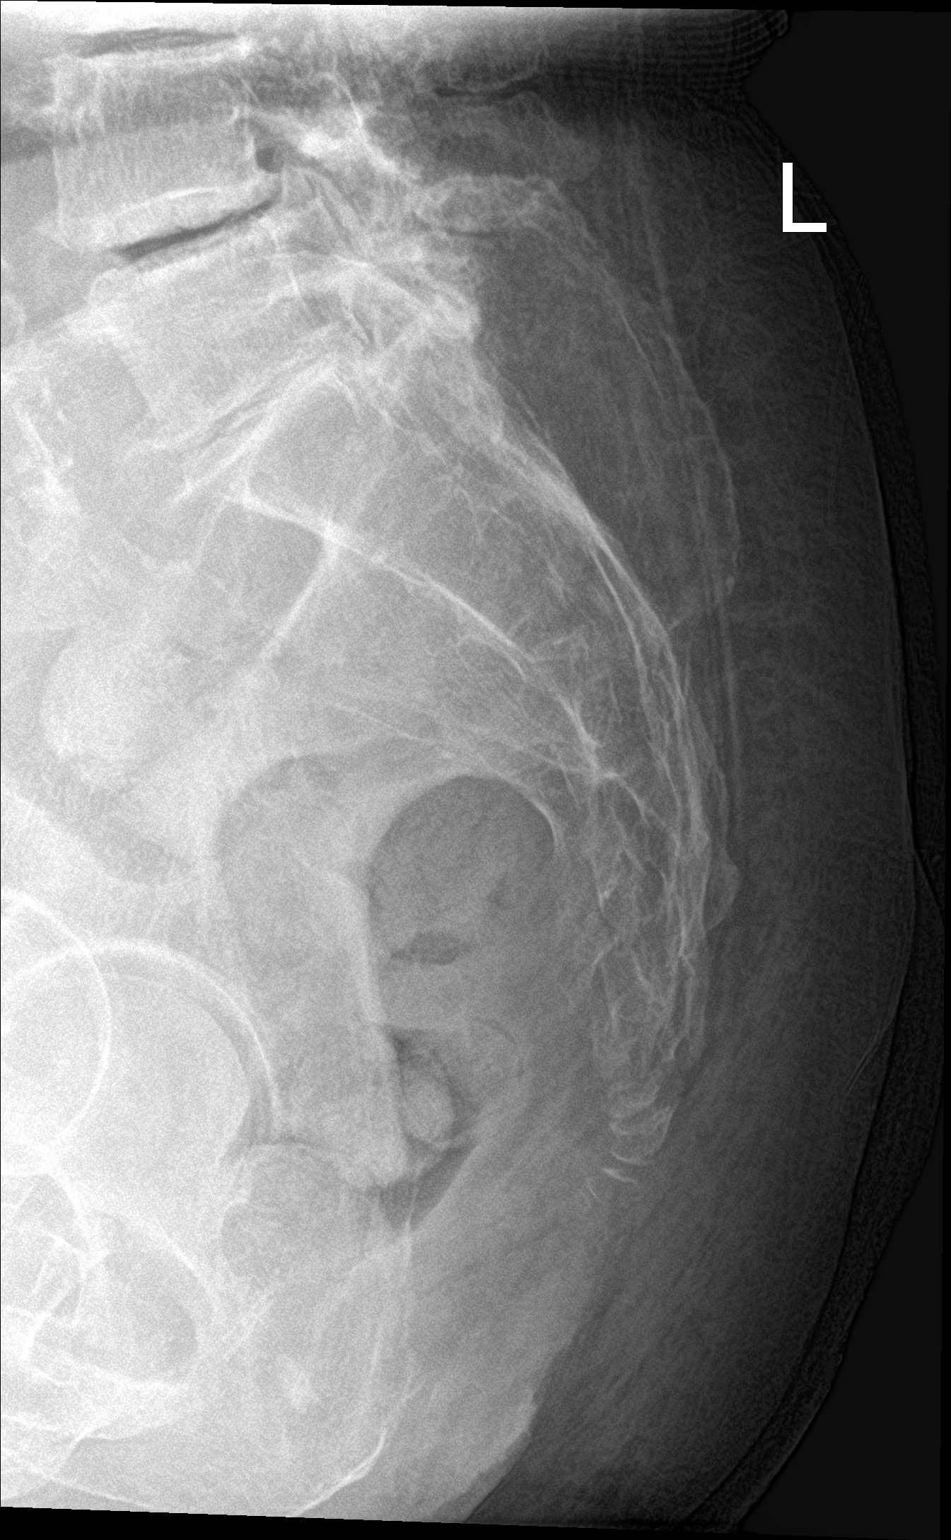

[3 of 3 positions shown; findings below may reference images not displayed]

FINDINGS: Pelvis and left hip:

There is diffuse decreased bone mineralization. Mild bilateral
femoroacetabular joint space narrowing, unchanged. No acute fracture
is seen. No dislocation. Mild to moderate dextrocurvature centered
at L3 with multilevel degenerative disc changes of the visualized
lumbar spine.

Sacrum and coccyx:

Bowel gas and stool obscure portions of the mid to lower sacrum on
frontal views. No definite sacral or coccygeal fracture is seen.
IMPRESSION: 1. Within the limitation of diffuse decreased bone mineralization,
no acute fracture is seen.
2. Mild-to-moderate dextrocurvature of the lumbar spine with
multilevel degenerative disc changes.

## 2023-01-17 IMAGING — DX DG HIP (WITH OR WITHOUT PELVIS) 2-3V*L*
3 series · 3 of 3 positions shown · non-contrast
Comparison: Pelvis and left hip radiographs 12/04/2020

CLINICAL DATA: Left hip pain.  Multiple falls in the last 2 weeks.

EXAM:
SACRUM AND COCCYX - 2+ VIEW; DG HIP (WITH OR WITHOUT PELVIS) 2-3V
LEFT

[pelvis ap]
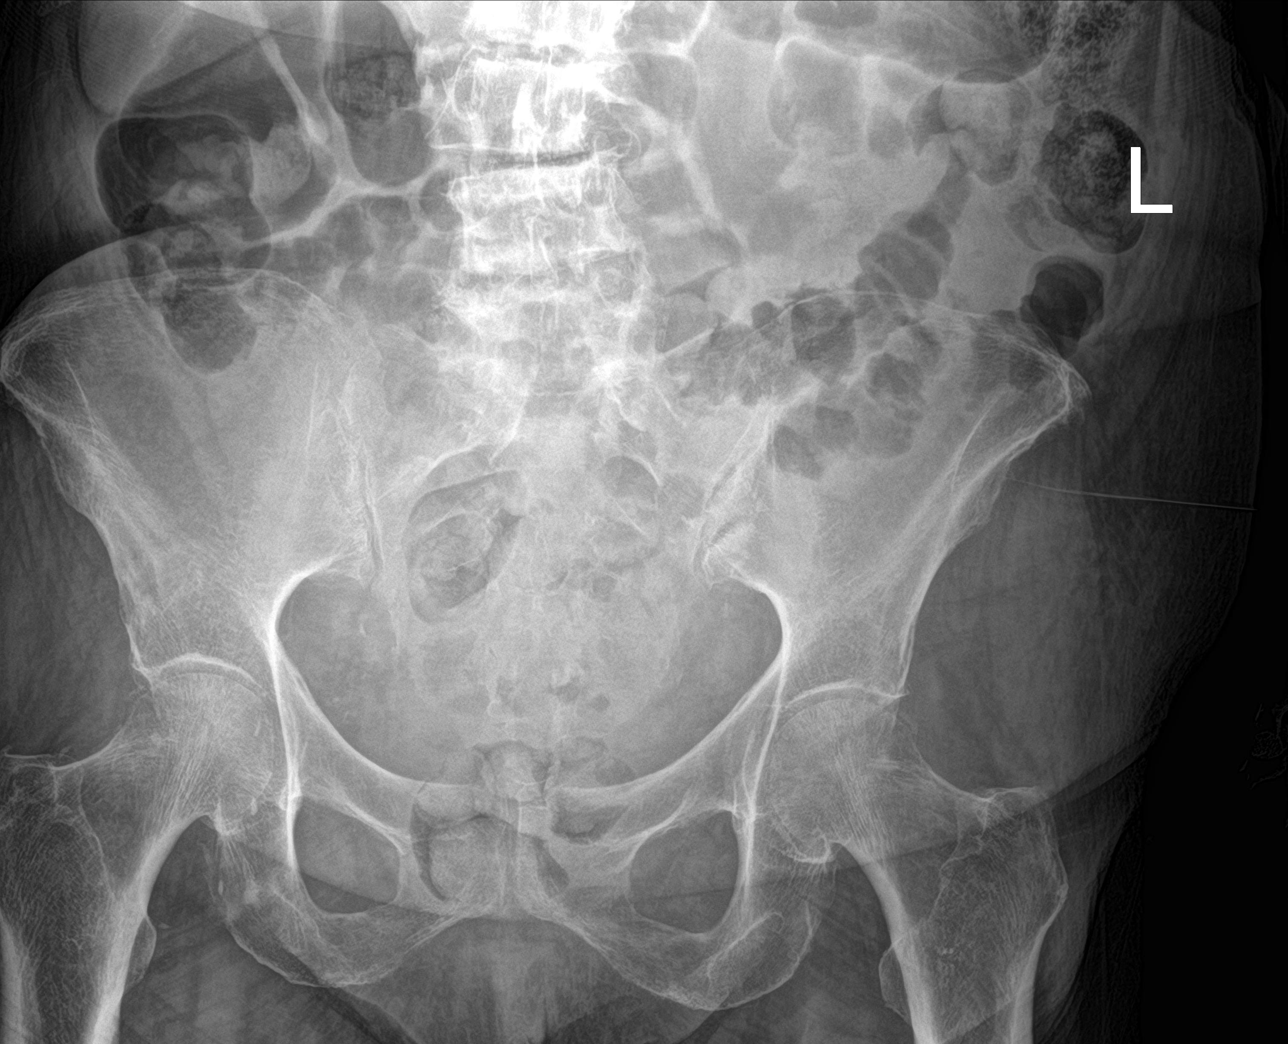

[hip ap]
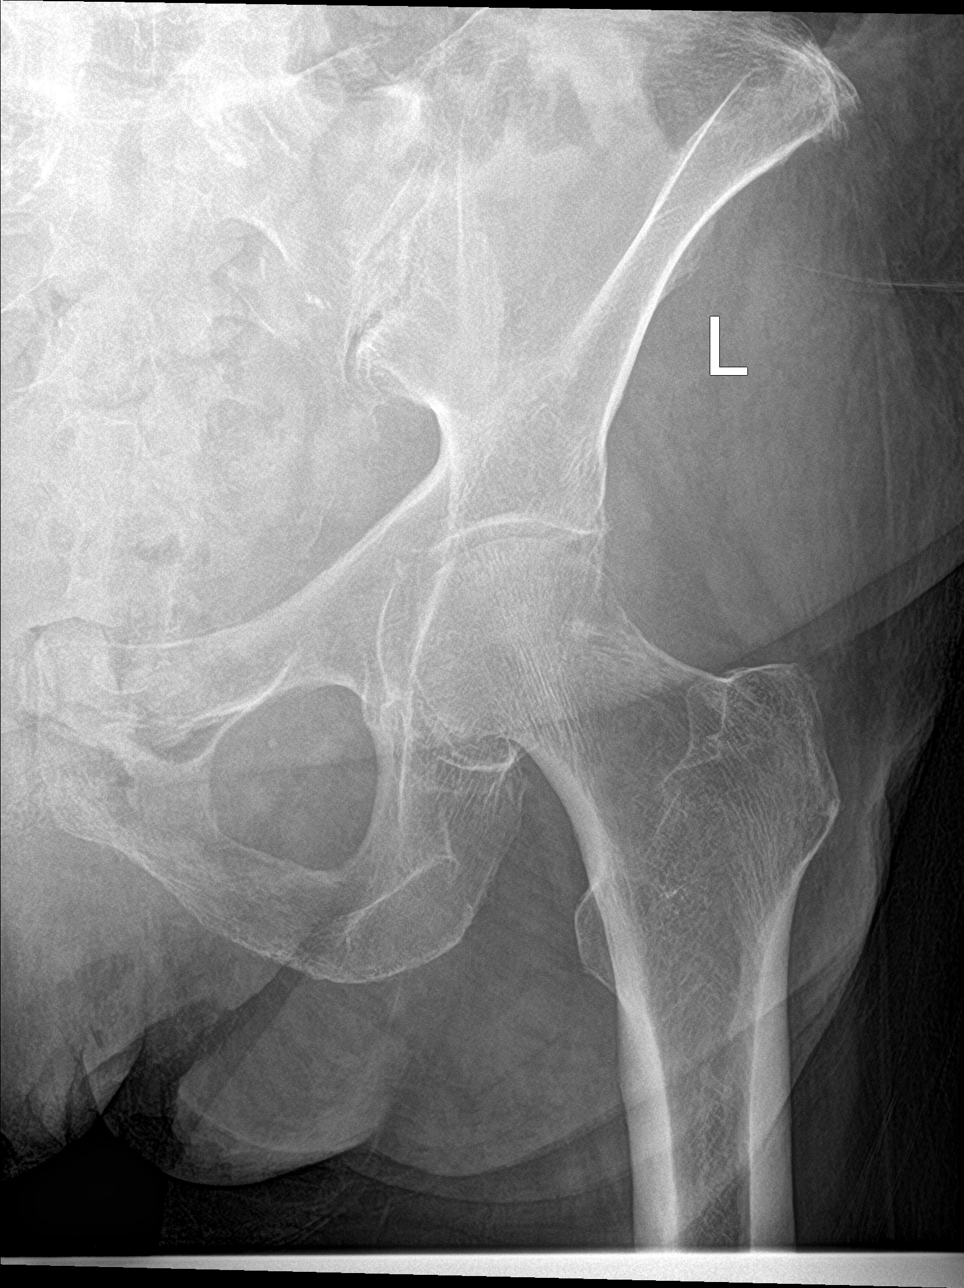

[hip lat]
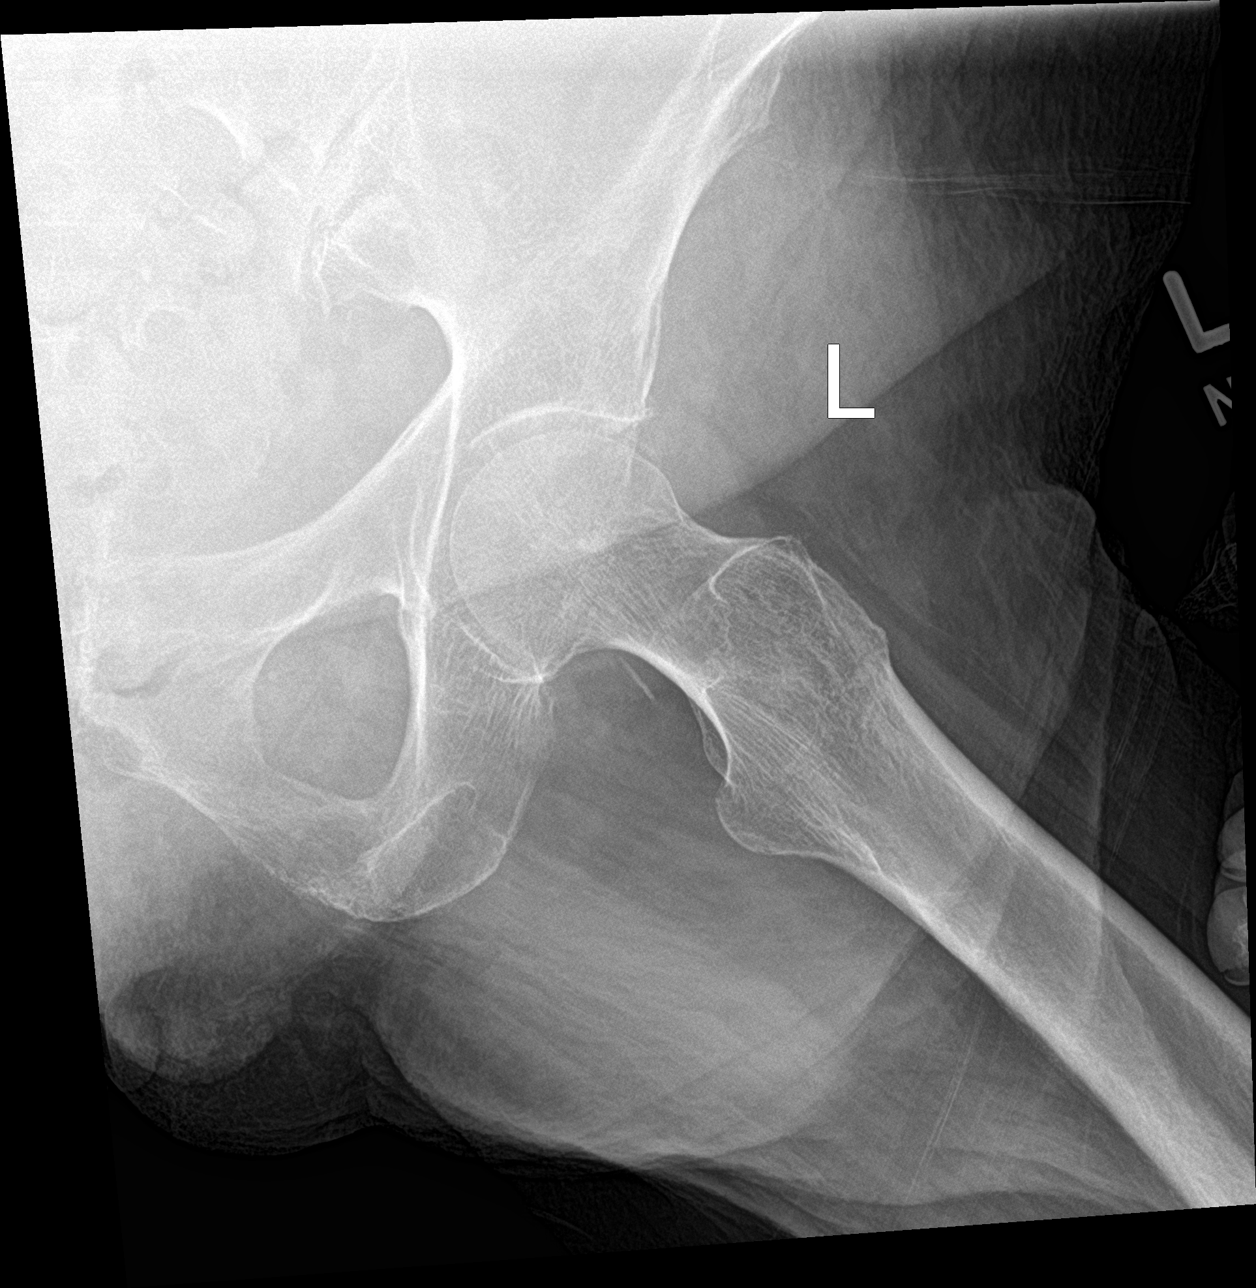

[3 of 3 positions shown; findings below may reference images not displayed]

FINDINGS: Pelvis and left hip:

There is diffuse decreased bone mineralization. Mild bilateral
femoroacetabular joint space narrowing, unchanged. No acute fracture
is seen. No dislocation. Mild to moderate dextrocurvature centered
at L3 with multilevel degenerative disc changes of the visualized
lumbar spine.

Sacrum and coccyx:

Bowel gas and stool obscure portions of the mid to lower sacrum on
frontal views. No definite sacral or coccygeal fracture is seen.
IMPRESSION: 1. Within the limitation of diffuse decreased bone mineralization,
no acute fracture is seen.
2. Mild-to-moderate dextrocurvature of the lumbar spine with
multilevel degenerative disc changes.
